# Patient Record
Sex: Male | Born: 1935 | Race: White | Hispanic: No | Smoking: Former smoker
Health system: Southern US, Community
[De-identification: ages and names within clinical notes are randomized; demographics above are authoritative.]

## PROBLEM LIST (undated history)

## (undated) DIAGNOSIS — D509 Iron deficiency anemia, unspecified: Secondary | ICD-10-CM

## (undated) DIAGNOSIS — G309 Alzheimer's disease, unspecified: Secondary | ICD-10-CM

## (undated) DIAGNOSIS — F0280 Dementia in other diseases classified elsewhere without behavioral disturbance: Secondary | ICD-10-CM

## (undated) DIAGNOSIS — I10 Essential (primary) hypertension: Secondary | ICD-10-CM

## (undated) DIAGNOSIS — E119 Type 2 diabetes mellitus without complications: Secondary | ICD-10-CM

## (undated) DIAGNOSIS — N182 Chronic kidney disease, stage 2 (mild): Secondary | ICD-10-CM

## (undated) DIAGNOSIS — F319 Bipolar disorder, unspecified: Secondary | ICD-10-CM

## (undated) DIAGNOSIS — F0390 Unspecified dementia without behavioral disturbance: Secondary | ICD-10-CM

## (undated) DIAGNOSIS — K922 Gastrointestinal hemorrhage, unspecified: Secondary | ICD-10-CM

## (undated) DIAGNOSIS — C4491 Basal cell carcinoma of skin, unspecified: Secondary | ICD-10-CM

## (undated) DIAGNOSIS — C4442 Squamous cell carcinoma of skin of scalp and neck: Secondary | ICD-10-CM

## (undated) DIAGNOSIS — I639 Cerebral infarction, unspecified: Secondary | ICD-10-CM

## (undated) DIAGNOSIS — L89159 Pressure ulcer of sacral region, unspecified stage: Secondary | ICD-10-CM

## (undated) DIAGNOSIS — F0150 Vascular dementia without behavioral disturbance: Secondary | ICD-10-CM

## (undated) HISTORY — DX: Iron deficiency anemia, unspecified: D50.9

## (undated) HISTORY — DX: Dementia in other diseases classified elsewhere without behavioral disturbance: F02.80

## (undated) HISTORY — DX: Vascular dementia without behavioral disturbance: F01.50

## (undated) HISTORY — DX: Gastrointestinal hemorrhage, unspecified: K92.2

## (undated) HISTORY — DX: Alzheimer's disease, unspecified: G30.9

## (undated) HISTORY — DX: Pressure ulcer of sacral region, unspecified stage: L89.159

## (undated) HISTORY — DX: Chronic kidney disease, stage 2 (mild): N18.2

## (undated) HISTORY — DX: Squamous cell carcinoma of skin of scalp and neck: C44.42

## (undated) HISTORY — DX: Basal cell carcinoma of skin, unspecified: C44.91

---

## 2009-08-28 DIAGNOSIS — I639 Cerebral infarction, unspecified: Secondary | ICD-10-CM

## 2009-08-28 HISTORY — DX: Cerebral infarction, unspecified: I63.9

## 2012-07-09 DIAGNOSIS — N182 Chronic kidney disease, stage 2 (mild): Secondary | ICD-10-CM | POA: Insufficient documentation

## 2012-07-09 HISTORY — DX: Chronic kidney disease, stage 2 (mild): N18.2

## 2012-11-30 ENCOUNTER — Emergency Department (HOSPITAL_COMMUNITY): Payer: Medicare Other

## 2012-11-30 ENCOUNTER — Inpatient Hospital Stay (HOSPITAL_COMMUNITY)
Admission: EM | Admit: 2012-11-30 | Discharge: 2012-12-03 | DRG: 470 | Disposition: A | Discharge status: Skilled Nursing Facility | Payer: Medicare Other | Attending: Internal Medicine | Admitting: Internal Medicine

## 2012-11-30 ENCOUNTER — Encounter (HOSPITAL_COMMUNITY): Payer: Self-pay | Admitting: Emergency Medicine

## 2012-11-30 DIAGNOSIS — I10 Essential (primary) hypertension: Secondary | ICD-10-CM | POA: Diagnosis present

## 2012-11-30 DIAGNOSIS — C4491 Basal cell carcinoma of skin, unspecified: Secondary | ICD-10-CM

## 2012-11-30 DIAGNOSIS — N289 Disorder of kidney and ureter, unspecified: Secondary | ICD-10-CM

## 2012-11-30 DIAGNOSIS — S72009A Fracture of unspecified part of neck of unspecified femur, initial encounter for closed fracture: Secondary | ICD-10-CM

## 2012-11-30 DIAGNOSIS — N183 Chronic kidney disease, stage 3 unspecified: Secondary | ICD-10-CM | POA: Diagnosis present

## 2012-11-30 DIAGNOSIS — W010XXA Fall on same level from slipping, tripping and stumbling without subsequent striking against object, initial encounter: Secondary | ICD-10-CM

## 2012-11-30 DIAGNOSIS — F0390 Unspecified dementia without behavioral disturbance: Secondary | ICD-10-CM | POA: Diagnosis present

## 2012-11-30 DIAGNOSIS — F0150 Vascular dementia without behavioral disturbance: Secondary | ICD-10-CM | POA: Diagnosis present

## 2012-11-30 DIAGNOSIS — S72002A Fracture of unspecified part of neck of left femur, initial encounter for closed fracture: Secondary | ICD-10-CM

## 2012-11-30 DIAGNOSIS — Y9210 Unspecified residential institution as the place of occurrence of the external cause: Secondary | ICD-10-CM | POA: Diagnosis present

## 2012-11-30 DIAGNOSIS — R29898 Other symptoms and signs involving the musculoskeletal system: Secondary | ICD-10-CM | POA: Diagnosis present

## 2012-11-30 DIAGNOSIS — E119 Type 2 diabetes mellitus without complications: Secondary | ICD-10-CM | POA: Diagnosis present

## 2012-11-30 DIAGNOSIS — L89899 Pressure ulcer of other site, unspecified stage: Secondary | ICD-10-CM

## 2012-11-30 DIAGNOSIS — E1122 Type 2 diabetes mellitus with diabetic chronic kidney disease: Secondary | ICD-10-CM | POA: Diagnosis present

## 2012-11-30 DIAGNOSIS — I69998 Other sequelae following unspecified cerebrovascular disease: Secondary | ICD-10-CM

## 2012-11-30 DIAGNOSIS — F0280 Dementia in other diseases classified elsewhere without behavioral disturbance: Secondary | ICD-10-CM | POA: Diagnosis present

## 2012-11-30 DIAGNOSIS — N400 Enlarged prostate without lower urinary tract symptoms: Secondary | ICD-10-CM | POA: Diagnosis present

## 2012-11-30 DIAGNOSIS — F319 Bipolar disorder, unspecified: Secondary | ICD-10-CM

## 2012-11-30 DIAGNOSIS — I69959 Hemiplegia and hemiparesis following unspecified cerebrovascular disease affecting unspecified side: Secondary | ICD-10-CM

## 2012-11-30 HISTORY — DX: Type 2 diabetes mellitus without complications: E11.9

## 2012-11-30 HISTORY — DX: Bipolar disorder, unspecified: F31.9

## 2012-11-30 HISTORY — DX: Essential (primary) hypertension: I10

## 2012-11-30 HISTORY — DX: Unspecified dementia, unspecified severity, without behavioral disturbance, psychotic disturbance, mood disturbance, and anxiety: F03.90

## 2012-11-30 HISTORY — DX: Cerebral infarction, unspecified: I63.9

## 2012-11-30 LAB — CBC WITH DIFFERENTIAL/PLATELET
Basophils Absolute: 0 10*3/uL (ref 0.0–0.1)
Basophils Relative: 0 % (ref 0–1)
Eosinophils Absolute: 0.1 10*3/uL (ref 0.0–0.7)
Eosinophils Relative: 2 % (ref 0–5)
HCT: 36.1 % — ABNORMAL LOW (ref 39.0–52.0)
Hemoglobin: 12.3 g/dL — ABNORMAL LOW (ref 13.0–17.0)
MCH: 31.3 pg (ref 26.0–34.0)
MCHC: 34.1 g/dL (ref 30.0–36.0)
Monocytes Absolute: 1 10*3/uL (ref 0.1–1.0)
Monocytes Relative: 12 % (ref 3–12)
RDW: 12.9 % (ref 11.5–15.5)

## 2012-11-30 LAB — COMPREHENSIVE METABOLIC PANEL
AST: 23 U/L (ref 0–37)
Albumin: 3.7 g/dL (ref 3.5–5.2)
BUN: 18 mg/dL (ref 6–23)
Calcium: 9.5 mg/dL (ref 8.4–10.5)
Creatinine, Ser: 1.26 mg/dL (ref 0.50–1.35)
Total Bilirubin: 0.7 mg/dL (ref 0.3–1.2)
Total Protein: 6.4 g/dL (ref 6.0–8.3)

## 2012-11-30 MED ORDER — SODIUM CHLORIDE 0.9 % IV SOLN: INTRAVENOUS | Status: DC

## 2012-11-30 MED ORDER — ALUM & MAG HYDROXIDE-SIMETH 200-200-20 MG/5ML PO SUSP: 30.0000 mL | Freq: Four times a day (QID) | ORAL | Status: DC | PRN

## 2012-11-30 MED ORDER — ACETAMINOPHEN 650 MG RE SUPP: 650.0000 mg | Freq: Four times a day (QID) | RECTAL | Status: DC | PRN

## 2012-11-30 MED ORDER — ZOLPIDEM TARTRATE 5 MG PO TABS: 5.0000 mg | ORAL_TABLET | Freq: Every evening | ORAL | Status: DC | PRN

## 2012-11-30 MED ORDER — ACETAMINOPHEN 325 MG PO TABS: 650.0000 mg | ORAL_TABLET | Freq: Four times a day (QID) | ORAL | Status: DC | PRN

## 2012-11-30 MED ORDER — SODIUM CHLORIDE 0.9 % IV SOLN
INTRAVENOUS | Status: DC
  Administered 2012-11-30: 22:00:00 via INTRAVENOUS

## 2012-11-30 MED ORDER — SODIUM CHLORIDE 0.9 % IV SOLN
INTRAVENOUS | Status: DC
  Administered 2012-11-30: via INTRAVENOUS

## 2012-11-30 MED ORDER — MORPHINE SULFATE 4 MG/ML IJ SOLN
4.0000 mg | Freq: Once | INTRAMUSCULAR | Status: AC
  Administered 2012-11-30: 4 mg via INTRAVENOUS
  Filled 2012-11-30: qty 1

## 2012-11-30 MED ORDER — OXYCODONE HCL 5 MG PO TABS
5.0000 mg | ORAL_TABLET | ORAL | Status: DC | PRN
  Administered 2012-11-30 – 2012-12-02 (×6): 5 mg via ORAL
  Filled 2012-11-30 (×6): qty 1

## 2012-11-30 MED ORDER — HYDROMORPHONE HCL PF 1 MG/ML IJ SOLN
0.5000 mg | INTRAMUSCULAR | Status: DC | PRN
  Administered 2012-12-01 – 2012-12-02 (×4): 1 mg via INTRAVENOUS
  Filled 2012-11-30 (×4): qty 1

## 2012-11-30 MED ORDER — ONDANSETRON HCL 4 MG/2ML IJ SOLN: 4.0000 mg | Freq: Four times a day (QID) | INTRAMUSCULAR | Status: DC | PRN

## 2012-11-30 MED ORDER — ONDANSETRON HCL 4 MG PO TABS: 4.0000 mg | ORAL_TABLET | Freq: Four times a day (QID) | ORAL | Status: DC | PRN

## 2012-11-30 MED ORDER — ONDANSETRON HCL 4 MG/2ML IJ SOLN
4.0000 mg | Freq: Once | INTRAMUSCULAR | Status: AC
  Administered 2012-11-30: 4 mg via INTRAVENOUS
  Filled 2012-11-30: qty 2

## 2012-11-30 NOTE — Consult Note (Signed)
Reason for Consult:left hip fracture Referring Physician: EDP  HPI: George Kemp is an 79 y.o. male resident of [facility] with PMH CVA with residual L hemiparesis, limited household ambulator fell today with c/o left hip pain and inability to bearweight.  Past Medical History  Diagnosis Date  . Stroke 08/2009    Left side with residual deficit  . Hypertension   . Bipolar 1 disorder   . Dementia     History reviewed. No pertinent past surgical history.  History reviewed. No pertinent family history.  Social History:  reports that he has never smoked. He does not have any smokeless tobacco history on file. He reports that he does not drink alcohol or use illicit drugs.  Allergies: No Known Allergies  Medications: none listed. No anticoagulatnts by report  No results found for this or any previous visit (from the past 48 hour(s)).  Dg Pelvis 1-2 Views  11/30/2012   *RADIOLOGY REPORT*  Clinical Data: Left hip pain.  PELVIS - 1-2 VIEW  Comparison: None.  Findings: Subcapital fracture of the left hip varus angulation of the fracture fragments.  No dislocation.  Degenerative changes in both hip.  The pelvis appears intact.  The pelvic rim, SI joints, and symphysis are not displaced.  No focal bone lesion appreciated. Vascular calcifications.  IMPRESSION: Subcapital fracture of the proximal left femur with varus angulation of the fracture fragments.  No displaced pelvic fractures identified.   Original Report Authenticated By: Burman Nieves, M.D.   Dg Femur Left  11/30/2012   *RADIOLOGY REPORT*  Clinical Data: Fall  LEFT FEMUR - 2 VIEW  Comparison: None  Findings: Osseous demineralization. Displaced subcapital fracture left femur. Left hip joint space narrowing. No additional fracture, dislocation or bone destruction. Scattered atherosclerotic calcifications. Visualized portion of the left pelvis intact.  IMPRESSION: Osseous demineralization. Displaced subcapital fracture left femur.   Original  Report Authenticated By: Ulyses Southward, M.D.     Vitals Temp:  [97.8 F (36.6 C)] 97.8 F (36.6 C) (07/04 2140) Pulse Rate:  [95] 95 (07/04 2140) Resp:  [21] 21 (07/04 2140) BP: (134)/(79) 134/79 mmHg (07/04 2140) SpO2:  [99 %] 99 % (07/04 2140) There is no height or weight on file to calculate BMI.  Physical Exam:  [place]/AT. Thin WM, cooperative. Neck non tender. UE's no gross bone or joint instability.Diffusely tender about left hip. grossley n/v intact distally BLE.     Assessment/Plan: Impression: displaced left femoral neck fracture Treatment: I have discussed with patient and family ( including daughter POA) regarding treatment options and risks vs benefits there of. They understand and accept and agree with plan for left hip hemiarthroplasty. OR tentatively scheduled for Saturday morning.  Teonia Yager M 11/30/2012, 11:34 PM

## 2012-11-30 NOTE — ED Provider Notes (Signed)
   History    CSN: [id number] Arrival date & time 11/30/12  2130  First MD Initiated Contact with Patient 11/30/12 2141     Chief Complaint  Patient presents with  . Fall   (Consider location/radiation/quality/duration/timing/severity/associated sxs/prior Treatment) Patient is a 79 y.o. male presenting with fall. The history is provided by the patient.  Fall   patient here after having an unwitnessed fall at the nursing home. No reported loss of consciousness. Complains of severe pain to his left hip and he is unable to ambulate. Denies any neck or head pain. Denies any chest or abdominal pain. Patient has had a stroke in the past and does have some residual left-sided weakness. EMS was called and patient transported here. Patient received fentanyl 50 mcg prior to arrival No past medical history on file. No past surgical history on file. No family history on file. History  Substance Use Topics  . Smoking status: Not on file  . Smokeless tobacco: Not on file  . Alcohol Use: Not on file    Review of Systems  All other systems reviewed and are negative.    Allergies  Review of patient's allergies indicates not on file.  Home Medications  No current outpatient prescriptions on file. BP 134/79  Pulse 95  Temp(Src) 97.8 F (36.6 C) (Oral)  Resp 21  SpO2 99% Physical Exam  Nursing note and vitals reviewed. Constitutional: He is oriented to person, place, and time. He appears well-developed and well-nourished.  Non-toxic appearance. No distress.  HENT:  Head: Normocephalic and atraumatic.  Eyes: Conjunctivae, EOM and lids are normal. Pupils are equal, round, and reactive to light.  Neck: Normal range of motion. Neck supple. No tracheal deviation present. No mass present.  Cardiovascular: Normal rate, regular rhythm and normal heart sounds.  Exam reveals no gallop.   No murmur heard. Pulmonary/Chest: Effort normal and breath sounds normal. No stridor. No respiratory distress.  He has no decreased breath sounds. He has no wheezes. He has no rhonchi. He has no rales.  Abdominal: Soft. Normal appearance and bowel sounds are normal. He exhibits no distension. There is no tenderness. There is no rebound and no CVA tenderness.  Musculoskeletal: Normal range of motion. He exhibits no edema and no tenderness.  Left leg shortened and rotated. Patient's neurovascular intact.  Neurological: He is alert and oriented to person, place, and time. He has normal strength. No cranial nerve deficit or sensory deficit. GCS eye subscore is 4. GCS verbal subscore is 5. GCS motor subscore is 6.  Skin: Skin is warm and dry. No abrasion and no rash noted.  Psychiatric: He has a normal mood and affect. His speech is normal and behavior is normal.    ED Course  Procedures (including critical care time) Labs Reviewed - No data to display No results found. No diagnosis found.  MDM  Spoke with Dr. supple and he will come see the patient and discussed with the family about operative repair for hip fracture. He request that the hospitalist admit the patient  Patient given morphine for pain.  Toy Baker, MD 11/30/12 [id number]

## 2012-11-30 NOTE — ED Notes (Signed)
BIB [facility]. From [facility] (called out for fall). Staff had found patient on floor of bathroom c/o Left leg pain (1730). EMS arrived, found in bed. Left leg externally rotated and shortened. Visible muscle spasms. Appropriate pedal pulse and sensation intact. (Hx of Left side stroke with residual deficit). Fentanyl given (2102). 20g R AC

## 2012-12-01 ENCOUNTER — Other Ambulatory Visit: Payer: Self-pay

## 2012-12-01 ENCOUNTER — Inpatient Hospital Stay: Admit: 2012-12-01 | Payer: Self-pay | Admitting: Specialist

## 2012-12-01 ENCOUNTER — Encounter (HOSPITAL_COMMUNITY): Payer: Self-pay | Admitting: Anesthesiology

## 2012-12-01 ENCOUNTER — Inpatient Hospital Stay (HOSPITAL_COMMUNITY): Payer: Medicare Other

## 2012-12-01 ENCOUNTER — Encounter (HOSPITAL_COMMUNITY)
Admission: EM | Disposition: A | Discharge status: Skilled Nursing Facility | Payer: Self-pay | Source: Home / Self Care | Attending: Internal Medicine

## 2012-12-01 ENCOUNTER — Encounter (HOSPITAL_COMMUNITY): Payer: Self-pay | Admitting: Internal Medicine

## 2012-12-01 ENCOUNTER — Inpatient Hospital Stay (HOSPITAL_COMMUNITY): Payer: Medicare Other | Admitting: Anesthesiology

## 2012-12-01 DIAGNOSIS — S72002A Fracture of unspecified part of neck of left femur, initial encounter for closed fracture: Secondary | ICD-10-CM

## 2012-12-01 DIAGNOSIS — W010XXA Fall on same level from slipping, tripping and stumbling without subsequent striking against object, initial encounter: Secondary | ICD-10-CM

## 2012-12-01 DIAGNOSIS — I10 Essential (primary) hypertension: Secondary | ICD-10-CM | POA: Diagnosis present

## 2012-12-01 DIAGNOSIS — Z794 Long term (current) use of insulin: Secondary | ICD-10-CM | POA: Diagnosis present

## 2012-12-01 DIAGNOSIS — F0150 Vascular dementia without behavioral disturbance: Secondary | ICD-10-CM

## 2012-12-01 HISTORY — PX: HIP ARTHROPLASTY: SHX981

## 2012-12-01 HISTORY — DX: Vascular dementia, unspecified severity, without behavioral disturbance, psychotic disturbance, mood disturbance, and anxiety: F01.50

## 2012-12-01 LAB — GLUCOSE, CAPILLARY
Glucose-Capillary: 159 mg/dL — ABNORMAL HIGH (ref 70–99)
Glucose-Capillary: 154 mg/dL — ABNORMAL HIGH (ref 70–99)

## 2012-12-01 LAB — URINALYSIS, ROUTINE W REFLEX MICROSCOPIC
Glucose, UA: NEGATIVE mg/dL
Leukocytes, UA: NEGATIVE
Protein, ur: NEGATIVE mg/dL
Specific Gravity, Urine: 1.005 (ref 1.005–1.030)

## 2012-12-01 LAB — CBC
HCT: 35.1 % — ABNORMAL LOW (ref 39.0–52.0)
Hemoglobin: 11.8 g/dL — ABNORMAL LOW (ref 13.0–17.0)
MCH: 31.6 pg (ref 26.0–34.0)
MCHC: 33.6 g/dL (ref 30.0–36.0)
MCV: 93.9 fL (ref 78.0–100.0)

## 2012-12-01 LAB — BASIC METABOLIC PANEL
BUN: 16 mg/dL (ref 6–23)
CO2: 26 mEq/L (ref 19–32)
Chloride: 108 mEq/L (ref 96–112)
Creatinine, Ser: 1.17 mg/dL (ref 0.50–1.35)
GFR calc Af Amer: 65 mL/min — ABNORMAL LOW (ref >90)
Glucose, Bld: 166 mg/dL — ABNORMAL HIGH (ref 70–99)
Potassium: 4.1 mEq/L (ref 3.5–5.1)

## 2012-12-01 LAB — ABO/RH: ABO/RH(D): B POS

## 2012-12-01 LAB — MRSA PCR SCREENING: MRSA by PCR: NEGATIVE

## 2012-12-01 SURGERY — HEMIARTHROPLASTY, HIP, DIRECT ANTERIOR APPROACH, FOR FRACTURE
Anesthesia: General | Laterality: Left

## 2012-12-01 SURGERY — HEMIARTHROPLASTY, HIP, DIRECT ANTERIOR APPROACH, FOR FRACTURE
Anesthesia: General | Site: Hip | Laterality: Left

## 2012-12-01 MED ORDER — CEFAZOLIN SODIUM-DEXTROSE 2-3 GM-% IV SOLR
2.0000 g | Freq: Three times a day (TID) | INTRAVENOUS | Status: AC
  Administered 2012-12-01 (×2): 2 g via INTRAVENOUS
  Filled 2012-12-01 (×2): qty 50

## 2012-12-01 MED ORDER — METOPROLOL TARTRATE 1 MG/ML IV SOLN
1.0000 mg | INTRAVENOUS | Status: DC | PRN
Start: 2012-12-01 — End: 2012-12-01
  Administered 2012-12-01 (×5): 1 mg via INTRAVENOUS

## 2012-12-01 MED ORDER — METOPROLOL TARTRATE 25 MG PO TABS
25.0000 mg | ORAL_TABLET | Freq: Two times a day (BID) | ORAL | Status: DC
  Administered 2012-12-02 – 2012-12-03 (×2): 25 mg via ORAL
  Filled 2012-12-01 (×7): qty 1

## 2012-12-01 MED ORDER — QUETIAPINE FUMARATE 25 MG PO TABS
25.0000 mg | ORAL_TABLET | Freq: Every day | ORAL | Status: DC
  Administered 2012-12-02: 25 mg via ORAL
  Filled 2012-12-01 (×3): qty 1

## 2012-12-01 MED ORDER — FENTANYL CITRATE 0.05 MG/ML IJ SOLN: 25.0000 ug | INTRAMUSCULAR | Status: DC | PRN

## 2012-12-01 MED ORDER — ASPIRIN 81 MG PO TBEC: 81.0000 mg | DELAYED_RELEASE_TABLET | Freq: Every day | ORAL | Status: DC

## 2012-12-01 MED ORDER — HYDROMORPHONE HCL PF 2 MG/ML IJ SOLN
INTRAMUSCULAR | Status: AC
  Administered 2012-12-01: 1 mg
  Filled 2012-12-01: qty 1

## 2012-12-01 MED ORDER — MAGNESIUM CITRATE PO SOLN: 1.0000 | Freq: Once | ORAL | Status: AC | PRN

## 2012-12-01 MED ORDER — HYDROCODONE-ACETAMINOPHEN 5-325 MG PO TABS: 1.0000 | ORAL_TABLET | Freq: Four times a day (QID) | ORAL | Status: DC | PRN

## 2012-12-01 MED ORDER — NEOSTIGMINE METHYLSULFATE 1 MG/ML IJ SOLN
INTRAMUSCULAR | Status: DC | PRN
  Administered 2012-12-01: 5 mg via INTRAVENOUS

## 2012-12-01 MED ORDER — PHENYLEPHRINE HCL 10 MG/ML IJ SOLN
INTRAMUSCULAR | Status: DC | PRN
  Administered 2012-12-01: 80 ug via INTRAVENOUS

## 2012-12-01 MED ORDER — FENTANYL CITRATE 0.05 MG/ML IJ SOLN
INTRAMUSCULAR | Status: DC | PRN
  Administered 2012-12-01 (×4): 50 ug via INTRAVENOUS

## 2012-12-01 MED ORDER — GLYCOPYRROLATE 0.2 MG/ML IJ SOLN
INTRAMUSCULAR | Status: DC | PRN
  Administered 2012-12-01: 0.6 mg via INTRAVENOUS

## 2012-12-01 MED ORDER — METOCLOPRAMIDE HCL 5 MG/ML IJ SOLN: 5.0000 mg | Freq: Three times a day (TID) | INTRAMUSCULAR | Status: DC | PRN

## 2012-12-01 MED ORDER — CEFAZOLIN SODIUM-DEXTROSE 2-3 GM-% IV SOLR: 2.0000 g | INTRAVENOUS | Status: DC

## 2012-12-01 MED ORDER — CHLORHEXIDINE GLUCONATE 4 % EX LIQD
60.0000 mL | Freq: Once | CUTANEOUS | Status: DC
  Filled 2012-12-01: qty 60

## 2012-12-01 MED ORDER — SUCCINYLCHOLINE CHLORIDE 20 MG/ML IJ SOLN
INTRAMUSCULAR | Status: DC | PRN
  Administered 2012-12-01: 100 mg via INTRAVENOUS

## 2012-12-01 MED ORDER — SENNOSIDES-DOCUSATE SODIUM 8.6-50 MG PO TABS
1.0000 | ORAL_TABLET | Freq: Every evening | ORAL | Status: DC | PRN
  Filled 2012-12-01: qty 1

## 2012-12-01 MED ORDER — DONEPEZIL HCL 10 MG PO TABS
10.0000 mg | ORAL_TABLET | Freq: Every day | ORAL | Status: DC
  Administered 2012-12-01 – 2012-12-03 (×3): 10 mg via ORAL
  Filled 2012-12-01 (×4): qty 1

## 2012-12-01 MED ORDER — SODIUM CHLORIDE 0.9 % IV SOLN
INTRAVENOUS | Status: AC
  Administered 2012-12-01 – 2012-12-02 (×2): via INTRAVENOUS

## 2012-12-01 MED ORDER — CLINDAMYCIN PHOSPHATE 600 MG/50ML IV SOLN
600.0000 mg | Freq: Once | INTRAVENOUS | Status: DC
  Filled 2012-12-01: qty 50

## 2012-12-01 MED ORDER — ASPIRIN EC 81 MG PO TBEC
81.0000 mg | DELAYED_RELEASE_TABLET | Freq: Every day | ORAL | Status: DC
  Administered 2012-12-02 – 2012-12-03 (×2): 81 mg via ORAL
  Filled 2012-12-01 (×3): qty 1

## 2012-12-01 MED ORDER — DOCUSATE SODIUM 100 MG PO CAPS
100.0000 mg | ORAL_CAPSULE | Freq: Two times a day (BID) | ORAL | Status: DC
  Administered 2012-12-02 – 2012-12-03 (×3): 100 mg via ORAL
  Filled 2012-12-01 (×5): qty 1

## 2012-12-01 MED ORDER — PROPOFOL 10 MG/ML IV BOLUS
INTRAVENOUS | Status: DC | PRN
  Administered 2012-12-01: 80 mg via INTRAVENOUS

## 2012-12-01 MED ORDER — SODIUM CHLORIDE 0.9 % IV SOLN: INTRAVENOUS | Status: DC

## 2012-12-01 MED ORDER — LATANOPROST 0.005 % OP SOLN
1.0000 [drp] | Freq: Every day | OPHTHALMIC | Status: DC
  Administered 2012-12-02: 1 [drp] via OPHTHALMIC
  Filled 2012-12-01 (×2): qty 2.5

## 2012-12-01 MED ORDER — MEPERIDINE HCL 25 MG/ML IJ SOLN: 6.2500 mg | INTRAMUSCULAR | Status: DC | PRN

## 2012-12-01 MED ORDER — CLOMIPRAMINE HCL 25 MG PO CAPS
25.0000 mg | ORAL_CAPSULE | Freq: Every morning | ORAL | Status: DC
  Administered 2012-12-01 – 2012-12-03 (×3): 25 mg via ORAL
  Filled 2012-12-01 (×4): qty 1

## 2012-12-01 MED ORDER — ADULT MULTIVITAMIN W/MINERALS CH
1.0000 | ORAL_TABLET | Freq: Every day | ORAL | Status: DC
  Administered 2012-12-01 – 2012-12-03 (×3): 1 via ORAL
  Filled 2012-12-01 (×4): qty 1

## 2012-12-01 MED ORDER — VITAMIN D3 25 MCG (1000 UNIT) PO TABS
1000.0000 [IU] | ORAL_TABLET | Freq: Every morning | ORAL | Status: DC
  Administered 2012-12-01 – 2012-12-03 (×3): 1000 [IU] via ORAL
  Filled 2012-12-01 (×4): qty 1

## 2012-12-01 MED ORDER — TAMSULOSIN HCL 0.4 MG PO CAPS
0.4000 mg | ORAL_CAPSULE | Freq: Every day | ORAL | Status: DC
  Administered 2012-12-01 – 2012-12-03 (×3): 0.4 mg via ORAL
  Filled 2012-12-01 (×5): qty 1

## 2012-12-01 MED ORDER — BISACODYL 10 MG RE SUPP: 10.0000 mg | Freq: Every day | RECTAL | Status: DC | PRN

## 2012-12-01 MED ORDER — LIDOCAINE HCL (CARDIAC) 20 MG/ML IV SOLN
INTRAVENOUS | Status: DC | PRN
  Administered 2012-12-01: 50 mg via INTRAVENOUS

## 2012-12-01 MED ORDER — MENTHOL 3 MG MT LOZG
1.0000 | LOZENGE | OROMUCOSAL | Status: DC | PRN
  Filled 2012-12-01: qty 9

## 2012-12-01 MED ORDER — ONDANSETRON HCL 4 MG/2ML IJ SOLN
INTRAMUSCULAR | Status: DC | PRN
  Administered 2012-12-01: 4 mg via INTRAVENOUS

## 2012-12-01 MED ORDER — METOCLOPRAMIDE HCL 10 MG PO TABS: 5.0000 mg | ORAL_TABLET | Freq: Three times a day (TID) | ORAL | Status: DC | PRN

## 2012-12-01 MED ORDER — LACTATED RINGERS IV SOLN
INTRAVENOUS | Status: DC | PRN
  Administered 2012-12-01: 1000 mL
  Administered 2012-12-01 (×2): via INTRAVENOUS

## 2012-12-01 MED ORDER — PROMETHAZINE HCL 25 MG/ML IJ SOLN: 6.2500 mg | INTRAMUSCULAR | Status: DC | PRN

## 2012-12-01 MED ORDER — PHENOL 1.4 % MT LIQD
1.0000 | OROMUCOSAL | Status: DC | PRN
  Filled 2012-12-01: qty 177

## 2012-12-01 MED ORDER — FENTANYL CITRATE 0.05 MG/ML IJ SOLN
25.0000 ug | INTRAMUSCULAR | Status: DC | PRN
  Administered 2012-12-01 (×4): 25 ug via INTRAVENOUS

## 2012-12-01 MED ORDER — POLYETHYLENE GLYCOL 3350 17 G PO PACK
17.0000 g | PACK | ORAL | Status: DC
  Administered 2012-12-03: 17 g via ORAL
  Filled 2012-12-01 (×2): qty 1

## 2012-12-01 MED ORDER — CEFAZOLIN SODIUM-DEXTROSE 2-3 GM-% IV SOLR
INTRAVENOUS | Status: DC | PRN
  Administered 2012-12-01: 2 g via INTRAVENOUS

## 2012-12-01 MED ORDER — INSULIN ASPART 100 UNIT/ML ~~LOC~~ SOLN
0.0000 [IU] | Freq: Four times a day (QID) | SUBCUTANEOUS | Status: DC
  Administered 2012-12-01 – 2012-12-02 (×2): 2 [IU] via SUBCUTANEOUS

## 2012-12-01 MED ORDER — METOPROLOL TARTRATE 1 MG/ML IV SOLN
5.0000 mg | INTRAVENOUS | Status: DC | PRN
  Administered 2012-12-02 (×2): 2.5 mg via INTRAVENOUS
  Filled 2012-12-01 (×2): qty 5

## 2012-12-01 MED ORDER — CLOPIDOGREL BISULFATE 75 MG PO TABS
75.0000 mg | ORAL_TABLET | Freq: Every day | ORAL | Status: DC
  Administered 2012-12-02 – 2012-12-03 (×2): 75 mg via ORAL
  Filled 2012-12-01 (×3): qty 1

## 2012-12-01 MED ORDER — ATORVASTATIN CALCIUM 20 MG PO TABS
20.0000 mg | ORAL_TABLET | Freq: Every morning | ORAL | Status: DC
  Administered 2012-12-01 – 2012-12-03 (×3): 20 mg via ORAL
  Filled 2012-12-01 (×4): qty 1

## 2012-12-01 MED ORDER — ROCURONIUM BROMIDE 100 MG/10ML IV SOLN
INTRAVENOUS | Status: DC | PRN
  Administered 2012-12-01: 30 mg via INTRAVENOUS
  Administered 2012-12-01: 20 mg via INTRAVENOUS

## 2012-12-01 MED ORDER — ESMOLOL HCL 10 MG/ML IV SOLN
INTRAVENOUS | Status: DC | PRN
  Administered 2012-12-01: 30 mg via INTRAVENOUS

## 2012-12-01 SURGICAL SUPPLY — 54 items
BAG ZIPLOCK 12X15 (MISCELLANEOUS) ×4 IMPLANT
BLADE SAG 18X100X1.27 (BLADE) ×2 IMPLANT
BLADE SAW SGTL 18X1.27X75 (BLADE) ×2 IMPLANT
BRUSH FEMORAL CANAL (MISCELLANEOUS) IMPLANT
CAPT HIP HD POR BIPOL/UNIPOL ×2 IMPLANT
CHLORAPREP W/TINT 26ML (MISCELLANEOUS) ×2 IMPLANT
CLOTH 2% CHLOROHEXIDINE 3PK (PERSONAL CARE ITEMS) IMPLANT
CLOTH BEACON ORANGE TIMEOUT ST (SAFETY) ×2 IMPLANT
DRAPE INCISE IOBAN 66X45 STRL (DRAPES) ×2 IMPLANT
DRAPE ORTHO SPLIT 77X108 STRL (DRAPES) ×1
DRAPE POUCH INSTRU U-SHP 10X18 (DRAPES) ×2 IMPLANT
DRAPE SURG ORHT 6 SPLT 77X108 (DRAPES) ×1 IMPLANT
DRAPE U-SHAPE 47X51 STRL (DRAPES) ×2 IMPLANT
DRSG AQUACEL AG ADV 3.5X 6 (GAUZE/BANDAGES/DRESSINGS) ×2 IMPLANT
DRSG AQUACEL AG ADV 3.5X10 (GAUZE/BANDAGES/DRESSINGS) ×4 IMPLANT
DRSG EMULSION OIL 3X16 NADH (GAUZE/BANDAGES/DRESSINGS) IMPLANT
DRSG PAD ABDOMINAL 8X10 ST (GAUZE/BANDAGES/DRESSINGS) IMPLANT
DURAPREP 26ML APPLICATOR (WOUND CARE) ×2 IMPLANT
ELECT BLADE TIP CTD 4 INCH (ELECTRODE) ×2 IMPLANT
ELECT REM PT RETURN 9FT ADLT (ELECTROSURGICAL) ×2
ELECTRODE REM PT RTRN 9FT ADLT (ELECTROSURGICAL) ×1 IMPLANT
EVACUATOR 1/8 PVC DRAIN (DRAIN) ×2 IMPLANT
FACESHIELD LNG OPTICON STERILE (SAFETY) ×4 IMPLANT
FLOSEAL 10ML (HEMOSTASIS) ×2 IMPLANT
GLOVE BIOGEL PI IND STRL 7.5 (GLOVE) ×1 IMPLANT
GLOVE BIOGEL PI IND STRL 8 (GLOVE) ×1 IMPLANT
GLOVE BIOGEL PI INDICATOR 7.5 (GLOVE) ×1
GLOVE BIOGEL PI INDICATOR 8 (GLOVE) ×1
GLOVE SURG SS PI 7.5 STRL IVOR (GLOVE) ×2 IMPLANT
GLOVE SURG SS PI 8.0 STRL IVOR (GLOVE) ×4 IMPLANT
GOWN PREVENTION PLUS LG XLONG (DISPOSABLE) ×2 IMPLANT
GOWN PREVENTION PLUS XLARGE (GOWN DISPOSABLE) ×4 IMPLANT
GOWN STRL REIN XL XLG (GOWN DISPOSABLE) ×4 IMPLANT
HANDPIECE INTERPULSE COAX TIP (DISPOSABLE)
IMMOBILIZER KNEE 20 (SOFTGOODS) ×2
IMMOBILIZER KNEE 20 THIGH 36 (SOFTGOODS) ×1 IMPLANT
KIT BASIN OR (CUSTOM PROCEDURE TRAY) ×2 IMPLANT
MANIFOLD NEPTUNE II (INSTRUMENTS) IMPLANT
NS IRRIG 1000ML POUR BTL (IV SOLUTION) ×2 IMPLANT
PACK TOTAL JOINT (CUSTOM PROCEDURE TRAY) ×2 IMPLANT
POSITIONER SURGICAL ARM (MISCELLANEOUS) ×2 IMPLANT
SET HNDPC FAN SPRY TIP SCT (DISPOSABLE) IMPLANT
SPONGE GAUZE 4X4 12PLY (GAUZE/BANDAGES/DRESSINGS) IMPLANT
SPONGE LAP 18X18 X RAY DECT (DISPOSABLE) ×2 IMPLANT
STAPLER VISISTAT (STAPLE) IMPLANT
SUT ETHIBOND NAB CT1 #1 30IN (SUTURE) ×6 IMPLANT
SUT VIC AB 0 CT1 27 (SUTURE) ×2
SUT VIC AB 0 CT1 27XBRD ANTBC (SUTURE) ×2 IMPLANT
SUT VIC AB 1 CT1 27 (SUTURE)
SUT VIC AB 1 CT1 27XBRD ANTBC (SUTURE) IMPLANT
SUT VIC AB 2-0 CT1 27 (SUTURE) ×2
SUT VIC AB 2-0 CT1 TAPERPNT 27 (SUTURE) ×2 IMPLANT
TOWER CARTRIDGE SMART MIX (DISPOSABLE) IMPLANT
WATER STERILE IRR 1500ML POUR (IV SOLUTION) ×2 IMPLANT

## 2012-12-01 NOTE — Progress Notes (Signed)
[facility]                                                                                Patient Demographics  George Kemp, is a 79 y.o. male, DOB - April 06, 1927, WGN:[id number], HQI:[id number]  Admit date - 11/30/2012  Admitting Physician Senaida Lange, MD  Outpatient Primary MD for the patient is No primary provider on file.  LOS - 1   Chief Complaint  Patient presents with  . Fall        Assessment & Plan    1. Trip Fall with Closed Left Hip Fracture- to undergo open reduction and internal fixation at [facility] on 12/01/2012 as requested by orthopedic surgeon Dr. Shelle Iron due to no or openings at [facility], have called and updated patient's daughter about the plan.  Cardio-Pulm Risk stratification for surgery and recommendations to minimize the same:-  A.Cardio-Pulmonary Risk -  this patient is a moderate risk for adverse Cardio-Pulmonary  Outcome  from surgery, the risks and benefits were discussed and acceptable to ration and his daughter  Recommendations for optimizing Cardio-Pulmonary  Risk risk factors  1. Keep SBP<140, HR<85, use Lopressor 5mg  IV q4hrs PRN, or B.Blocker drip PRN. 2. Moniotr I&Os. 3. Minimal sedation and Narcotics. 4. Good pulmunary toilet. 5. PRN Nebs and as needed oxygen to keep Pox>90% 6. Hb>8, transfuse as needed- Lasix 10mg  IV after each unit PRBC Transfused.   B.Bleeding Risk - no previous surgical complications, of note we have held his Plavix.   Lab Results  Component Value Date   PLT 153 12/01/2012                  No results found for this basename: INR, PROTIME      Will request Surgeon to please Order Lovenox/DVT prophylaxis of his/her choice when OK from Surgeon's standpoint post op.      2. Fall due to Stumbling/Slipping- Due to Chronic Left Sided Weakness from his CVA hx. Bedrest and Fall Precautions. PT after surgery to be commenced.    3. HTN- IV hydralazine PRN SBP > 160 and IV Lopressor per parameters  for tachycardia and hypertension, will place on low dose oral beta blocker instead of Norvasc.    4. Diabetes- continue SSI coverage PRN.   No results found for this basename: HGBA1C   CBG (last 3)   Recent Labs  12/01/12 0644  GLUCAP 159*       5. Dementia - No acute changes, stable. Home medications have been resumed with a sip of water.    6. BPH. Continue home dose Flomax.    7. History of CVA. Plavix has been held as patient is due for surgery, continue home dose statin.     Code Status: full  Family Communication: Daughter called and informed over the cell phone  Disposition Plan: [facility], then rehab   Procedures  ORIF L femur planned for 12-01-12   Consults Ortho   DVT Prophylaxis   SCDs    Lab Results  Component Value Date   PLT 153 12/01/2012    Medications  Scheduled Meds: . sodium chloride   Intravenous STAT  . chlorhexidine  60  mL Topical Once   Continuous Infusions: . sodium chloride 75 mL/hr at 11/30/12 2351   PRN Meds:.acetaminophen, acetaminophen, alum & mag hydroxide-simeth, HYDROmorphone (DILAUDID) injection, ondansetron (ZOFRAN) IV, ondansetron, oxyCODONE, zolpidem  Antibiotics     Anti-infectives   None       Time Spent in minutes   35   Denecia Brunette K M.D on 12/01/2012 at 8:27 AM  Between 7am to 7pm - Pager - [phone]  After 7pm go to [email or web address] - password [email or web address]  And look for the night coverage person covering for me after hours  [facility] Office  [phone]    Subjective:   George Kemp today has, No headache, No chest pain, No abdominal pain - No Nausea, No new weakness tingling or numbness, No Cough - SOB. On it left-sided weakness, some left hip pain.  Objective:   Filed Vitals:   11/30/12 2349 12/01/12 0031 12/01/12 0641 12/01/12 0746  BP: 151/77 141/86 121/65   Pulse: 101 103 110   Temp: 97.9 F (36.6 C) 98.2 F (36.8 C) 98.2 F (36.8 C)   TempSrc: Oral Oral    Resp: 18 18  18    Height:    5\' 11"  (1.803 m)  Weight:    77.747 kg (171 lb 6.4 oz)  SpO2: 98% 97% 98%     Wt Readings from Last 3 Encounters:  12/01/12 77.747 kg (171 lb 6.4 oz)  12/01/12 77.747 kg (171 lb 6.4 oz)  12/01/12 77.747 kg (171 lb 6.4 oz)     Intake/Output Summary (Last 24 hours) at 12/01/12 0827 Last data filed at 12/01/12 0643  Gross per 24 hour  Intake      0 ml  Output    450 ml  Net   -450 ml    Exam Awake Alert, Oriented X 3, No new F.N deficits, chronic left-sided hemiparesis, Normal affect [place].AT,PERRAL Supple Neck,No JVD, No cervical lymphadenopathy appriciated.  Symmetrical Chest wall movement, Good air movement bilaterally, CTAB RRR,No Gallops,Rubs or new Murmurs, No Parasternal Heave +ve B.Sounds, Abd Soft, Non tender, No organomegaly appriciated, No rebound - guarding or rigidity. No Cyanosis, Clubbing or edema, No new Rash or bruise     Data Review   Micro Results Recent Results (from the past 240 hour(s))  MRSA PCR SCREENING     Status: None   Collection Time    12/01/12  6:53 AM      Result Value Range Status   MRSA by PCR NEGATIVE  NEGATIVE Final   Comment:            The GeneXpert MRSA Assay (FDA     approved for NASAL specimens     only), is one component of a     comprehensive MRSA colonization     surveillance program. It is not     intended to diagnose MRSA     infection nor to guide or     monitor treatment for     MRSA infections.    Radiology Reports Dg Chest 2 View  12/01/2012   *RADIOLOGY REPORT*  Clinical Data: Pain.  History of stroke and hypertension.  Preop left hip pain.  CHEST - 2 VIEW  Comparison: None.  Findings: Shallow inspiration with linear atelectasis in the lung bases.  No blunting of costophrenic angles.  No pneumothorax. Mediastinal contours appear intact.  Normal heart size and pulmonary vascularity.  Degenerative changes in the spine.  IMPRESSION: Shallow inspiration with atelectasis in the lung bases.   Original Report  Authenticated By: Burman Nieves, M.D.   Dg Pelvis 1-2 Views  11/30/2012   *RADIOLOGY REPORT*  Clinical Data: Left hip pain.  PELVIS - 1-2 VIEW  Comparison: None.  Findings: Subcapital fracture of the left hip varus angulation of the fracture fragments.  No dislocation.  Degenerative changes in both hip.  The pelvis appears intact.  The pelvic rim, SI joints, and symphysis are not displaced.  No focal bone lesion appreciated. Vascular calcifications.  IMPRESSION: Subcapital fracture of the proximal left femur with varus angulation of the fracture fragments.  No displaced pelvic fractures identified.   Original Report Authenticated By: Burman Nieves, M.D.   Dg Femur Left  11/30/2012   *RADIOLOGY REPORT*  Clinical Data: Fall  LEFT FEMUR - 2 VIEW  Comparison: None  Findings: Osseous demineralization. Displaced subcapital fracture left femur. Left hip joint space narrowing. No additional fracture, dislocation or bone destruction. Scattered atherosclerotic calcifications. Visualized portion of the left pelvis intact.  IMPRESSION: Osseous demineralization. Displaced subcapital fracture left femur.   Original Report Authenticated By: Ulyses Southward, M.D.    CBC  Recent Labs Lab 11/30/12 2302 12/01/12 0520  WBC 8.6 7.5  HGB 12.3* 11.8*  HCT 36.1* 35.1*  PLT 161 153  MCV 91.9 93.9  MCH 31.3 31.6  MCHC 34.1 33.6  RDW 12.9 13.2  LYMPHSABS 1.3  --   MONOABS 1.0  --   EOSABS 0.1  --   BASOSABS 0.0  --     Chemistries   Recent Labs Lab 11/30/12 2302 12/01/12 0520  NA 142 143  K 3.9 4.1  CL 106 108  CO2 24 26  GLUCOSE 139* 166*  BUN 18 16  CREATININE 1.26 1.17  CALCIUM 9.5 8.7  AST 23  --   ALT 16  --   ALKPHOS 84  --   BILITOT 0.7  --    ------------------------------------------------------------------------------------------------------------------ estimated creatinine clearance is 51 ml/min (by C-G formula based on Cr of  1.17). ------------------------------------------------------------------------------------------------------------------ No results found for this basename: HGBA1C,  in the last 72 hours ------------------------------------------------------------------------------------------------------------------ No results found for this basename: CHOL, HDL, LDLCALC, TRIG, CHOLHDL, LDLDIRECT,  in the last 72 hours ------------------------------------------------------------------------------------------------------------------ No results found for this basename: TSH, T4TOTAL, FREET3, T3FREE, THYROIDAB,  in the last 72 hours ------------------------------------------------------------------------------------------------------------------ No results found for this basename: VITAMINB12, FOLATE, FERRITIN, TIBC, IRON, RETICCTPCT,  in the last 72 hours  Coagulation profile No results found for this basename: INR, PROTIME,  in the last 168 hours  No results found for this basename: DDIMER,  in the last 72 hours  Cardiac Enzymes No results found for this basename: CK, CKMB, TROPONINI, MYOGLOBIN,  in the last 168 hours ------------------------------------------------------------------------------------------------------------------ No components found with this basename: POCBNP,

## 2012-12-01 NOTE — H&P (Signed)
[facility] History and Physical  Antionne Enrique ZOX:[id number] DOB: [birth year] DOA: 11/30/2012  Referring physician:  EDP PCP: No primary provider on file.  Specialists:   Chief Complaint: left Hip Pain after Fall  HPI: George Kemp is a 79 y.o. male resident of the [facility] who was brought to the ED after suffering a fall onto his left side.  He reports being in his bathroom, and reached out to grasp the bathroom rail and he slipped and fell onto his left side.   He had increased pain in his left hip and was unable to bear weight on his left foot.  He denied having any chest pain or dizziness or syncope associated with his fall.  He was sent to the ED for further evaluation and was found to have a closed fracture of the left hip.  Orthopedics Dr.supple was consulted and evaluated the patient in the ED and scheduled for surgery in the AM on 07/05.      Review of Systems: The patient denies anorexia, fever, chills, headaches, weight loss, vision loss, diplopia, dizziness, decreased hearing, rhinitis, hoarseness, chest pain, syncope, dyspnea on exertion, peripheral edema, balance deficits, cough, hemoptysis, abdominal pain, nausea, vomiting, diarrhea, constipation, hematemesis, melena, hematochezia, severe indigestion/heartburn, dysuria, hematuria, incontinence, muscle weakness, suspicious skin lesions, transient blindness, depression, unusual weight change, abnormal bleeding, enlarged lymph nodes, angioedema, and breast masses.   Past Medical History  Diagnosis Date  . Stroke 08/2009    Left side with residual deficit  . Hypertension   . Bipolar 1 disorder   . Dementia   . Diabetes     History reviewed. No pertinent past surgical history.  Prior to Admission medications   Medication Sig Start Date End Date Taking? Authorizing Provider  acetaminophen (TYLENOL) 325 MG tablet Take 650 mg by mouth every 6 (six) hours as needed for pain.   Yes Historical Provider, MD  amLODipine  (NORVASC) 5 MG tablet Take 5 mg by mouth every morning.   Yes Historical Provider, MD  atorvastatin (LIPITOR) 20 MG tablet Take 20 mg by mouth every morning.   Yes Historical Provider, MD  cholecalciferol (VITAMIN D) 1000 UNITS tablet Take 1,000 Units by mouth every morning.   Yes Historical Provider, MD  clomiPRAMINE (ANAFRANIL) 25 MG capsule Take 25 mg by mouth every morning.   Yes Historical Provider, MD  clopidogrel (PLAVIX) 75 MG tablet Take 75 mg by mouth daily.   Yes Historical Provider, MD  donepezil (ARICEPT) 10 MG tablet Take 10 mg by mouth daily.   Yes Historical Provider, MD  latanoprost (XALATAN) 0.005 % ophthalmic solution Place 1 drop into both eyes at bedtime.   Yes Historical Provider, MD  loratadine (CLARITIN) 10 MG tablet Take 10 mg by mouth daily as needed for allergies.   Yes Historical Provider, MD  Multiple Vitamin (MULTIVITAMIN WITH MINERALS) TABS Take 1 tablet by mouth daily.   Yes Historical Provider, MD  polyethylene glycol (MIRALAX / GLYCOLAX) packet Take 17 g by mouth every other day.   Yes Historical Provider, MD  QUEtiapine (SEROQUEL) 25 MG tablet Take 25 mg by mouth at bedtime.   Yes Historical Provider, MD  tamsulosin (FLOMAX) 0.4 MG CAPS Take 0.4 mg by mouth daily after breakfast.   Yes Historical Provider, MD    No Known Allergies  Social History:  reports that he has never smoked. He does not have any smokeless tobacco history on file. He reports that he does not drink alcohol or use illicit drugs.  Family History  Problem Relation Age of Onset  . Stroke Mother   . Stroke Brother      X 2  . Congestive Heart Failure Mother   . Diabetes Brother   . Cancer - Other Father     Unknown  Type    (be sure to complete)   Physical Exam:  GEN:  Pleasant well developed Elderly 79 y.o.Caucasian  male  examined  and in discomfort but no acute distress; cooperative with exam Filed Vitals:   11/30/12 2140 11/30/12 2349  BP: 134/79 151/77  Pulse: 95 101   Temp: 97.8 F (36.6 C)   TempSrc: Oral   Resp: 21 18  SpO2: 99% 98%   Blood pressure 151/77, pulse 101, temperature 97.8 F (36.6 C), temperature source Oral, resp. rate 18, SpO2 98.00%. PSYCH: He is alert and oriented x4; does not appear anxious does not appear depressed; affect is normal HEENT: Normocephalic and Atraumatic, Mucous membranes pink; PERRLA; EOM intact; Fundi:  Benign;  No scleral icterus, Nares: Patent, Oropharynx: Clear, Fair Dentition, Neck:  FROM, no cervical lymphadenopathy nor thyromegaly or carotid bruit; no JVD; Breasts:: Not examined CHEST WALL: No tenderness CHEST: Normal respiration, clear to auscultation bilaterally HEART: Regular rate and rhythm; no murmurs rubs or gallops BACK: No kyphosis or scoliosis; no CVA tenderness ABDOMEN: Positive Bowel Sounds,  soft non-tender; no masses, no organomegaly.   Rectal Exam: Not done EXTREMITIES: No cyanosis, clubbing or edema; no ulcerations. Genitalia: not examined PULSES: 2+ and symmetric SKIN: Normal hydration no rash or ulceration CNS: Cranial nerves 2-12 grossly intact,  Chronic Left Hemiparesis: unchanged    Labs on Admission:  Basic Metabolic Panel:  Recent Labs Lab 11/30/12 2302  NA 142  K 3.9  CL 106  CO2 24  GLUCOSE 139*  BUN 18  CREATININE 1.26  CALCIUM 9.5   Liver Function Tests:  Recent Labs Lab 11/30/12 2302  AST 23  ALT 16  ALKPHOS 84  BILITOT 0.7  PROT 6.4  ALBUMIN 3.7   No results found for this basename: LIPASE, AMYLASE,  in the last 168 hours No results found for this basename: AMMONIA,  in the last 168 hours CBC:  Recent Labs Lab 11/30/12 2302  WBC 8.6  NEUTROABS 6.1  HGB 12.3*  HCT 36.1*  MCV 91.9  PLT 161   Cardiac Enzymes: No results found for this basename: CKTOTAL, CKMB, CKMBINDEX, TROPONINI,  in the last 168 hours  BNP (last 3 results) No results found for this basename: PROBNP,  in the last 8760 hours CBG: No results found for this basename: GLUCAP,   in the last 168 hours  Radiological Exams on Admission: Dg Chest 2 View  12/01/2012   *RADIOLOGY REPORT*  Clinical Data: Pain.  History of stroke and hypertension.  Preop left hip pain.  CHEST - 2 VIEW  Comparison: None.  Findings: Shallow inspiration with linear atelectasis in the lung bases.  No blunting of costophrenic angles.  No pneumothorax. Mediastinal contours appear intact.  Normal heart size and pulmonary vascularity.  Degenerative changes in the spine.  IMPRESSION: Shallow inspiration with atelectasis in the lung bases.   Original Report Authenticated By: Burman Nieves, M.D.   Dg Pelvis 1-2 Views  11/30/2012   *RADIOLOGY REPORT*  Clinical Data: Left hip pain.  PELVIS - 1-2 VIEW  Comparison: None.  Findings: Subcapital fracture of the left hip varus angulation of the fracture fragments.  No dislocation.  Degenerative changes in both hip.  The pelvis appears intact.  The pelvic rim, SI joints, and symphysis are not displaced.  No focal bone lesion appreciated. Vascular calcifications.  IMPRESSION: Subcapital fracture of the proximal left femur with varus angulation of the fracture fragments.  No displaced pelvic fractures identified.   Original Report Authenticated By: Burman Nieves, M.D.   Dg Femur Left  11/30/2012   *RADIOLOGY REPORT*  Clinical Data: Fall  LEFT FEMUR - 2 VIEW  Comparison: None  Findings: Osseous demineralization. Displaced subcapital fracture left femur. Left hip joint space narrowing. No additional fracture, dislocation or bone destruction. Scattered atherosclerotic calcifications. Visualized portion of the left pelvis intact.  IMPRESSION: Osseous demineralization. Displaced subcapital fracture left femur.   Original Report Authenticated By: Ulyses Southward, M.D.     EKG: Ordered   Assessment/Plan Principal Problem:   Closed left hip fracture Active Problems:   Fall due to stumbling   HTN (hypertension)   Diabetes   Dementia    1.  Closed Left Hip Fracture-  For  Orthopedic Surgery in AM by Dr. Edwyna Shell Dr. Shelle Iron on 07/05.   Pain Control PRN with IV Dilaudid.     2.  Fall due to Stumbling/Slipping-  Due to Chronic Left Sided Weakness from his CVA hx.   Bedrest and Fall Precautions.    3.  HTN- IV hydralazine PRN SBP > 160, resume Amlodipine post surgically.    4.  Diabetes- Check HbA1c in AM, and SSI coverage PRN.     5.  Dementia - No acute changes, stable.    6.  SCDs for DVT Prophylaxis.     7.  Labs and EKG ordered.       Code Status:   FULL CODE Family Communication:  Daughter and Son In [place] at Bedside   Disposition Plan:    Return to SNF     Time spent:  63 Minutes  Ron Parker [facility] Pager [phone]  If 7PM-7AM, please contact night-coverage [email or web address] Password [email or web address] 12/01/2012, 12:04 AM

## 2012-12-01 NOTE — Anesthesia Preprocedure Evaluation (Signed)
Anesthesia Evaluation  Patient identified by MRN, date of birth, ID band Patient awake    Reviewed: Allergy & Precautions, H&P , Patient's Chart, lab work & pertinent test results, reviewed documented beta blocker date and time   History of Anesthesia Complications Negative for: history of anesthetic complications  Airway Mallampati: II TM Distance: >3 FB Neck ROM: full    Dental no notable dental hx.    Pulmonary neg pulmonary ROS,  breath sounds clear to auscultation  Pulmonary exam normal       Cardiovascular Exercise Tolerance: Good hypertension, negative cardio ROS  Rhythm:regular Rate:Normal     Neuro/Psych PSYCHIATRIC DISORDERS Bipolar Disorder CVA negative neurological ROS  negative psych ROS   GI/Hepatic negative GI ROS, Neg liver ROS,   Endo/Other  negative endocrine ROSdiabetes  Renal/GU negative Renal ROS     Musculoskeletal   Abdominal   Peds  Hematology negative hematology ROS (+)   Anesthesia Other Findings Stroke 08/2009 Left side with residual deficit Hypertension        Bipolar 1 disorder     Dementia        Diabetes          Reproductive/Obstetrics negative OB ROS                           Anesthesia Physical Anesthesia Plan  ASA: III and emergent  Anesthesia Plan: General ETT   Post-op Pain Management:    Induction:   Airway Management Planned:   Additional Equipment:   Intra-op Plan:   Post-operative Plan:   Informed Consent: I have reviewed the patients History and Physical, chart, labs and discussed the procedure including the risks, benefits and alternatives for the proposed anesthesia with the patient or authorized representative who has indicated his/her understanding and acceptance.   Dental Advisory Given  Plan Discussed with: CRNA and Surgeon  Anesthesia Plan Comments:         Anesthesia Quick Evaluation

## 2012-12-01 NOTE — H&P (View-Only) (Signed)
Subjective: Left hip pain   Objective: Vital signs in last 24 hours: Temp:  [97.8 F (36.6 C)-98.2 F (36.8 C)] 98.2 F (36.8 C) (07/05 0641) Pulse Rate:  [95-110] 110 (07/05 0641) Resp:  [18-21] 18 (07/05 0641) BP: (121-151)/(65-86) 121/65 mmHg (07/05 0641) SpO2:  [97 %-100 %] 98 % (07/05 0641) Weight:  [77.747 kg (171 lb 6.4 oz)] 77.747 kg (171 lb 6.4 oz) (07/05 0746)  Intake/Output from previous day: 07/04 0701 - 07/05 0700 In: 0  Out: 450 [Urine:450] Intake/Output this shift:     Recent Labs  11/30/12 2302 12/01/12 0520  HGB 12.3* 11.8*    Recent Labs  11/30/12 2302 12/01/12 0520  WBC 8.6 7.5  RBC 3.93* 3.74*  HCT 36.1* 35.1*  PLT 161 153    Recent Labs  11/30/12 2302 12/01/12 0520  NA 142 143  K 3.9 4.1  CL 106 108  CO2 24 26  BUN 18 16  CREATININE 1.26 1.17  GLUCOSE 139* 166*  CALCIUM 9.5 8.7   No results found for this basename: LABPT, INR,  in the last 72 hours  Neurologically intact Intact pulses distally Compartment soft  Assessment/Plan: Left hip fracture. Plan left hip hemiartgroplasty. Risks discussed.   Sarahjane Matherly C 12/01/2012, 7:54 AM      

## 2012-12-01 NOTE — Brief Op Note (Addendum)
11/30/2012 - 12/01/2012  12:06 PM  PATIENT:  Webb Silversmith  79 y.o. male  PRE-OPERATIVE DIAGNOSIS:  left hip fracture  POST-OPERATIVE DIAGNOSIS:  left hip fracture  PROCEDURE:  Procedure(s): ARTHROPLASTY BIPOLAR HIP (Left)  SURGEON:  Surgeon(s) and Role:    * Javier Docker, MD - Primary  PHYSICIAN ASSISTANT:   ASSISTANTS: Bissell   ANESTHESIA:   general  EBL:  Total I/O In: 1000 [I.V.:1000] Out: 700 [Urine:300; Blood:400]  BLOOD ADMINISTERED:none  DRAINS: none   LOCAL MEDICATIONS USED:  NONE  SPECIMEN:  No Specimen  DISPOSITION OF SPECIMEN:  N/A  COUNTS:  YES  TOURNIQUET:  * No tourniquets in log *  DICTATION: .Other Dictation: Dictation Number [id number]  PLAN OF CARE: Admit to inpatient   PATIENT DISPOSITION:  PACU - hemodynamically stable.   Delay start of Pharmacological VTE agent (>24hrs) due to surgical blood loss or risk of bleeding: no Per pharmacy plavix with 81mg  ASA for both DVT and CVA prophylaxis

## 2012-12-01 NOTE — Interval H&P Note (Signed)
History and Physical Interval Note:  12/01/2012 9:54 AM  George Kemp  has presented today for surgery, with the diagnosis of left hip fracture  The various methods of treatment have been discussed with the patient and family. After consideration of risks, benefits and other options for treatment, the patient has consented to  Procedure(s): ARTHROPLASTY BIPOLAR HIP (Left) as a surgical intervention .  The patient's history has been reviewed, patient examined, no change in status, stable for surgery.  I have reviewed the patient's chart and labs.  Questions were answered to the patient's satisfaction.     Milanni Ayub C

## 2012-12-01 NOTE — Transfer of Care (Signed)
Immediate Anesthesia Transfer of Care Note  Patient: George Kemp  Procedure(s) Performed: Procedure(s): ARTHROPLASTY BIPOLAR HIP (Left)  Patient Location: PACU  Anesthesia Type:General  Level of Consciousness: awake and alert   Airway & Oxygen Therapy: Patient Spontanous Breathing and Patient connected to face mask oxygen  Post-op Assessment: Report given to PACU RN and Post -op Vital signs reviewed and stable  Post vital signs: Reviewed and stable  Complications: No apparent anesthesia complications

## 2012-12-01 NOTE — Progress Notes (Signed)
pacu nursing note:  Metoprolol given as per orders for heart rate greater than 110. (126) Blood pressure interval changed to q5 minutes Will continue to monitor

## 2012-12-01 NOTE — Anesthesia Postprocedure Evaluation (Signed)
  Anesthesia Post-op Note  Patient: George Kemp  Procedure(s) Performed: Procedure(s): ARTHROPLASTY BIPOLAR HIP (Left)  Patient Location: PACU  Anesthesia Type:General  Level of Consciousness: awake and alert   Airway and Oxygen Therapy: Patient Spontanous Breathing and Patient connected to nasal cannula oxygen  Post-op Pain: none  Post-op Assessment: Post-op Vital signs reviewed and Patient's Cardiovascular Status Stable  Post-op Vital Signs: Reviewed and stable  Complications: No apparent anesthesia complications

## 2012-12-01 NOTE — Progress Notes (Signed)
Subjective: Left hip pain   Objective: Vital signs in last 24 hours: Temp:  [97.8 F (36.6 C)-98.2 F (36.8 C)] 98.2 F (36.8 C) (07/05 0641) Pulse Rate:  [95-110] 110 (07/05 0641) Resp:  [18-21] 18 (07/05 0641) BP: (121-151)/(65-86) 121/65 mmHg (07/05 0641) SpO2:  [97 %-100 %] 98 % (07/05 0641) Weight:  [77.747 kg (171 lb 6.4 oz)] 77.747 kg (171 lb 6.4 oz) (07/05 0746)  Intake/Output from previous day: 07/04 0701 - 07/05 0700 In: 0  Out: 450 [Urine:450] Intake/Output this shift:     Recent Labs  11/30/12 2302 12/01/12 0520  HGB 12.3* 11.8*    Recent Labs  11/30/12 2302 12/01/12 0520  WBC 8.6 7.5  RBC 3.93* 3.74*  HCT 36.1* 35.1*  PLT 161 153    Recent Labs  11/30/12 2302 12/01/12 0520  NA 142 143  K 3.9 4.1  CL 106 108  CO2 24 26  BUN 18 16  CREATININE 1.26 1.17  GLUCOSE 139* 166*  CALCIUM 9.5 8.7   No results found for this basename: LABPT, INR,  in the last 72 hours  Neurologically intact Intact pulses distally Compartment soft  Assessment/Plan: Left hip fracture. Plan left hip hemiartgroplasty. Risks discussed.   Karol Skarzynski C 12/01/2012, 7:54 AM

## 2012-12-01 NOTE — Discharge Instructions (Signed)
Partial weightbearing left leg Immobilizer left leg Posterior hip precautions Plavix 75mg  daily and ASA 81mg  daily for DVT ppx Aquacel dressing may remain in place x 7 days. May shower with aquacel in place. After 7 days, remove aquacel and place gauze and tape dressing which should be kept clean and dry and changed daily Follow up in 10-14 days with Dr. Shelle Iron for staple removal

## 2012-12-02 DIAGNOSIS — F0390 Unspecified dementia without behavioral disturbance: Secondary | ICD-10-CM

## 2012-12-02 DIAGNOSIS — S72009A Fracture of unspecified part of neck of unspecified femur, initial encounter for closed fracture: Principal | ICD-10-CM

## 2012-12-02 DIAGNOSIS — E119 Type 2 diabetes mellitus without complications: Secondary | ICD-10-CM

## 2012-12-02 LAB — GLUCOSE, CAPILLARY: Glucose-Capillary: 169 mg/dL — ABNORMAL HIGH (ref 70–99)

## 2012-12-02 LAB — CBC
HCT: 28.6 % — ABNORMAL LOW (ref 39.0–52.0)
MCHC: 32.2 g/dL (ref 30.0–36.0)
Platelets: 135 10*3/uL — ABNORMAL LOW (ref 150–400)
RDW: 13.6 % (ref 11.5–15.5)
WBC: 11.2 10*3/uL — ABNORMAL HIGH (ref 4.0–10.5)

## 2012-12-02 LAB — BASIC METABOLIC PANEL
BUN: 15 mg/dL (ref 6–23)
Chloride: 108 mEq/L (ref 96–112)
GFR calc Af Amer: 54 mL/min — ABNORMAL LOW (ref >90)
GFR calc non Af Amer: 46 mL/min — ABNORMAL LOW (ref >90)
Potassium: 4.4 mEq/L (ref 3.5–5.1)
Sodium: 140 mEq/L (ref 135–145)

## 2012-12-02 MED ORDER — INSULIN ASPART 100 UNIT/ML ~~LOC~~ SOLN
0.0000 [IU] | Freq: Three times a day (TID) | SUBCUTANEOUS | Status: DC
  Administered 2012-12-02: 3 [IU] via SUBCUTANEOUS
  Administered 2012-12-02 – 2012-12-03 (×2): 2 [IU] via SUBCUTANEOUS

## 2012-12-02 MED ORDER — INSULIN ASPART 100 UNIT/ML ~~LOC~~ SOLN
3.0000 [IU] | Freq: Three times a day (TID) | SUBCUTANEOUS | Status: DC
  Administered 2012-12-02 – 2012-12-03 (×3): 3 [IU] via SUBCUTANEOUS

## 2012-12-02 NOTE — Progress Notes (Signed)
HR has remained in the 113-120 beats/min range for rest of night.

## 2012-12-02 NOTE — Progress Notes (Signed)
Clinical Social Work Department BRIEF PSYCHOSOCIAL ASSESSMENT 12/02/2012  Patient:  George Kemp, George Kemp     Account Number:  [id number]     Admit date:  11/30/2012  Clinical Social Worker:  Doroteo Glassman  Date/Time:  12/02/2012 10:48 AM  Referred by:  Physician  Date Referred:  12/02/2012 Referred for  SNF Placement   Other Referral:   Interview type:  Patient Other interview type:   Pt's son, Loraine Leriche    PSYCHOSOCIAL DATA Living Status:  FACILITY Admitted from facility:  [facility] Level of care:  Skilled Nursing Facility Primary support name:  Loraine Leriche Primary support relationship to patient:  CHILD, ADULT Degree of support available:   strong    CURRENT CONCERNS Current Concerns  Post-Acute Placement   Other Concerns:    SOCIAL WORK ASSESSMENT / PLAN Met with Pt and son at bedside.    Pt and son reported that Pt has been at the skilled side of [facility] since Jan due to a stroke.  Pt will return there upon d/c.    Contacted [facility].  Ok for Pt to return upon d/c.    Weekday CSW to follow.   Assessment/plan status:  Psychosocial Support/Ongoing Assessment of Needs Other assessment/ plan:   Information/referral to community resources:   n/a    PATIENT'S/FAMILY'S RESPONSE TO PLAN OF CARE: Pt happy to return to [facility] upon d/c.   Providence Crosby, LCSWA Clinical Social Work [phone]

## 2012-12-02 NOTE — Evaluation (Signed)
Physical Therapy Evaluation Patient Details Name: George Kemp MRN: [id number] DOB: 01-Jun-1926 Today's Date: 12/02/2012 Time: 7829-5621 PT Time Calculation (min): 26 min  PT Assessment / Plan / Recommendation History of Present Illness  Pt with L fem fracture s/p bipolar hip arthroplasty with history of L sided weakness from CVA.  Was at [facility] SNF side for rehab and was mostly w/c bound, however was ambulating some with PT using hemi walker.  During this eval, note that pt very confused, which is not pts baseline.  RN feels it is from meds.   Clinical Impression  Pt able to tolerate sit to stand x 2 reps then stand pivot to chair with +2 assist going towards R side.  Performed with pts arms around therapist on one side then around techs shoulders on other side.  Pt will benefit from skilled PT in acute venue to address deficits.  PT recommends returning to SNF at D/C to maximize pts safety and function.     PT Assessment  Patient needs continued PT services    Follow Up Recommendations  SNF    Does the patient have the potential to tolerate intense rehabilitation      Barriers to Discharge        Equipment Recommendations  None recommended by PT    Recommendations for Other Services OT consult   Frequency Min 3X/week    Precautions / Restrictions Precautions Precautions: Fall;Posterior Hip Precaution Comments: Spoke with RN regarding posterior hip precautions and wrote them on pts white board.   Required Braces or Orthoses: Knee Immobilizer - Left Knee Immobilizer - Left:  (Maintained during eval due to increased confusion) Restrictions Weight Bearing Restrictions: Yes LLE Weight Bearing: Partial weight bearing LLE Partial Weight Bearing Percentage or Pounds: 50%   Pertinent Vitals/Pain 4/10 per PAINAD      Mobility  Bed Mobility Bed Mobility: Supine to Sit Supine to Sit: 1: +2 Total assist;HOB elevated Supine to Sit: Patient Percentage: 30% Details for Bed  Mobility Assistance: Pt requires assist for BLEs out of bed and for trunk to attain sitting position.  Also utilized bed pad to scoot hips closer to EOB.  Provided max cues for pt to self assist, however pt with very severe posterior lean getting into seated position with max cues to self correct.  Transfers Transfers: Sit to Stand;Stand to Sit;Stand Pivot Transfers Sit to Stand: 1: +2 Total assist;From elevated surface;From bed Sit to Stand: Patient Percentage: 20% Stand to Sit: 1: +2 Total assist;To chair/3-in-1 Stand to Sit: Patient Percentage: 20% Stand Pivot Transfers: 1: +2 Total assist Stand Pivot Transfers: Patient Percentage: 20% Details for Transfer Assistance: Assist to rise, maintain upright stance and ensure controlled descent.  Max cues for forward leaning when standing and also for RLE placement.  maintained L knee immobilizer due to pts increased confusion and decreased ability to follow THP.  Performed stand pivot transfer towards pts R side with pts arm around therapist shoulders on each side.   Ambulation/Gait Ambulation/Gait Assistance: Not tested (comment) Assistive device:  (2 person "3 muskateer style" see transfer note) Stairs: No Wheelchair Mobility Wheelchair Mobility: No    Exercises     PT Diagnosis: Difficulty walking;Generalized weakness;Acute pain  PT Problem List: Decreased strength;Decreased range of motion;Decreased activity tolerance;Decreased balance;Decreased mobility;Decreased coordination;Decreased cognition;Decreased knowledge of use of DME;Decreased safety awareness;Decreased knowledge of precautions PT Treatment Interventions: DME instruction;Functional mobility training;Therapeutic activities;Therapeutic exercise;Balance training;Neuromuscular re-education;Patient/family education     PT Goals(Current goals can be found in the care plan  section) Acute Rehab PT Goals Patient Stated Goal: n/a PT Goal Formulation: With patient/family Time For Goal  Achievement: 12/09/12 Potential to Achieve Goals: Fair  Visit Information  Last PT Received On: 12/02/12 Assistance Needed: +2 History of Present Illness: Pt with L fem fracture s/p bipolar hip arthroplasty with history of L sided weakness from CVA.  Was at [facility] SN[facility] side for rehab and was mostly w/c bound, however was ambulating some with PT using hemi walker.  During this eval, note that pt very confused, which is not pts baseline.  RN feels it is from meds.        Prior Functioning  Home Living Family/patient expects to be discharged to:: Skilled nursing facility Prior Function Level of Independence: Needs assistance Gait / Transfers Assistance Needed: Pt was transferring with assistance to w/c, bed, etc.  Ambulating some with hemi walker in PT.   ADL's / Homemaking Assistance Needed: Needed assistance for bathing and dressing Communication / Swallowing Assistance Needed: Pt was able to feed himself.     Cognition  Cognition Arousal/Alertness: Suspect due to medications Behavior During Therapy: WFL for tasks assessed/performed Overall Cognitive Status: Impaired/Different from baseline Area of Impairment: Orientation;Memory;Following commands Orientation Level: Disoriented to;Place;Time Memory: Decreased recall of precautions Following Commands: Follows one step commands inconsistently General Comments: Pt very confused during session and makes very out of the ordinary remarks.  RN aware.     Extremity/Trunk Assessment Upper Extremity Assessment Upper Extremity Assessment: LUE deficits/detail LUE Deficits / Details: pt with very limited grip strength on LUE and unable to move UE without assist from RUE>  Lower Extremity Assessment Lower Extremity Assessment: LLE deficits/detail LLE Deficits / Details: unable to fully assess due to pain and precautions, however note that L side weaker PTA due to CVA.  Cervical / Trunk Assessment Cervical / Trunk Assessment: Other  exceptions Cervical / Trunk Exceptions: Note that pt kept head turned to right.  Could correct with cues but not maintain.    Balance    End of Session PT - End of Session Equipment Utilized During Treatment: Left knee immobilizer;Gait belt Activity Tolerance: Patient limited by pain Patient left: in chair;with call bell/phone within reach;with chair alarm set Nurse Communication: Mobility status;Need for lift equipment  GP     Vista Deck 12/02/2012, 2:14 PM

## 2012-12-02 NOTE — Progress Notes (Signed)
Notified MD on call of sustained HR in 130's for greater than 1/2 hour.  Pt with no other symptoms, appears restful.  BP 115/57 at time of notification.  EKG obtained per orders and 2.5mg  Lopressor given per charge RN, Inetta Fermo.  Provider on-call notified of EKG result and another 2.5mg  Lopressor ordered and given.  HR currently 117 on monitor.  Will continue to monitor.

## 2012-12-02 NOTE — Op Note (Signed)
NAMEMarland Kitchen  George Kemp, George Kemp                 ACCOUNT NO.:  [id number]  MEDICAL RECORD NO.:  [id number]  LOCATION:  1444                         FACILITY:  [facility]  PHYSICIAN:  Jene Every, M.D.    DATE OF BIRTH:  09-06-1926  DATE OF PROCEDURE: DATE OF DISCHARGE:                              OPERATIVE REPORT   PREOPERATIVE DIAGNOSIS:  Left femoral neck fracture, displaced.  POSTOPERATIVE DIAGNOSIS:  Left femoral neck fracture, displaced.  PROCEDURE PERFORMED:  Left hip hemiarthroplasty, utilizing DePuy AML 13.5 femur, 1.5 neck, and a 53 bipolar assembly.  PROSTHETIC DEMAND:  The patient was an ambulator with minimal comorbidities.  Higher demand.  ASSISTANT:  Lanna Poche, PA.  HISTORY:  An 101, fell fractured, indicated for replacement, displaced femoral neck.  Risk and benefits were discussed including bleeding, infection, damage to neurovascular structures, DVT, PE, leg length discrepancy, dislocation, etc.  TECHNIQUE:  With the patient in supine position and  after induction of adequate anesthesia and 2 g Kefzol, placed in a right lateral decubitus position.  All bony prominences were well padded.  Hip holder utilized. Well leg secured with gentle flexion.  The left hip peritrochanteric region of lower extremity was prepped and draped in the usual sterile fashion.  Standard posterolateral approach to the hip was then utilized with incision based on the trochanter.  Subcutaneous tissue was dissected.  Electrocautery was utilized to achieve hemostasis.  The tensor fascia was divided in line with the skin incision.  The adductor tenotomy performed.  Internally rotated, identified the piriformis, tagged, and reflected posteriorly.  The remainder of the external rotators were then protected posteriorly to protect the sciatic nerve throughout the case.  We used then a beaver identifying the capsule, performed a T-shaped capsulotomy.  The inferior leaflet, however, was torn and not  sufficient the superior leaflet was.  The femoral neck fracture was identified.  The hip was dislocated.  Self retainers placed, used an oscillating saw and performed an osteotomy 1 fingerbreadth above the femoral neck.  Then, using an initiator and a box chisel and entered the femoral canal, sequentially reaming it to good cortical contact lateralizing it during the process at 13.5 mm and I then broached the appropriate anteversion from 10-12.5-13.5.  Used a large of the small was insufficient with good purchase.  Following this, we removed the head, debrided the acetabulum, cauterized any vessels. We did throughout the case.  There was no aggressive bleeding.  He was on Plavix.  I measured the head at 53.  We then impacted the AML permanent prosthesis after irrigation into place with excellent purchase.  We then reduced it with a 1.5 neck, bipolar assembling was stable throughout a full range with good leg lengths.  We then redislocated, removed the trial and cleaned the trunnion and placed a permanent bipolar assembly, impacted into place.  We protect the sciatic nerve throughout the case with reductions as well.  The hip was reduced and found to be stable throughout a full range specifically with flexion and internal rotation.  Leg lengths were equivalent and good stability was noted.  Copiously irrigated the wound.  Electrocautery was utilized to achieve hemostasis.  The gentle oozing  from the canal, we did then put some FloSeal around that area into the wound for better hemostatic control.  We then palpated the course of the sciatic nerve, it was intact.  The superior leaflet was intact.  We repaired the adductor tenotomy with 1 Ethibond.  The tensor fascia with #1 Vicryl interrupted figure-of-8 sutures to subcutaneous with 2 skin staples, wound was dressed sterilely.  Placed supine on the hospital bed, and knee immobilizer placed, equivalent leg lengths, good pulses, extubated,  and transferred to the recovery room in satisfactory condition.  The patient tolerated the procedure well.  There were no complications. Blood loss 400 mL.  Urine output 300.     Jene Every, M.D.     Cordelia Pen  D:  12/01/2012  T:  12/02/2012  Job:  [id number]

## 2012-12-02 NOTE — Progress Notes (Signed)
Subjective: 1 Day Post-Op Procedure(s) (LRB): ARTHROPLASTY BIPOLAR HIP (Left) Patient reports pain as 4 on 0-10 scale.    Objective: Vital signs in last 24 hours: Temp:  [97.6 F (36.4 C)-99.6 F (37.6 C)] 99.6 F (37.6 C) (07/06 0600) Pulse Rate:  [96-146] 121 (07/06 0600) Resp:  [11-20] 18 (07/06 0600) BP: (98-145)/(47-93) 102/47 mmHg (07/06 0600) SpO2:  [91 %-100 %] 92 % (07/06 0600)  Intake/Output from previous day: 07/05 0701 - 07/06 0700 In: 4219.3 [P.O.:440; I.V.:3779.3] Out: 1850 [Urine:1450; Blood:400] Intake/Output this shift:     Recent Labs  11/30/12 2302 12/01/12 0520 12/02/12 0520  HGB 12.3* 11.8* 9.2*    Recent Labs  12/01/12 0520 12/02/12 0520  WBC 7.5 11.2*  RBC 3.74* 2.97*  HCT 35.1* 28.6*  PLT 153 135*    Recent Labs  12/01/12 0520 12/02/12 0520  NA 143 140  K 4.1 4.4  CL 108 108  CO2 26 26  BUN 16 15  CREATININE 1.17 1.36*  GLUCOSE 166* 211*  CALCIUM 8.7 8.4   No results found for this basename: LABPT, INR,  in the last 72 hours  Still weak DF PF from stroke. No change No DVT.  Assessment/Plan: 1 Day Post-Op Procedure(s) (LRB): ARTHROPLASTY BIPOLAR HIP (Left) Advance diet Up with therapy  Kailen Hinkle C 12/02/2012, 8:47 AM

## 2012-12-02 NOTE — Progress Notes (Signed)
[facility] PROGRESS NOTE  George Kemp WUJ:[id number] DOB: September 14, 1926 DOA: 11/30/2012 PCP: No primary provider on file.  Assessment/Plan: Left Hip Fracture -Per ortho. -S/p left hemiarthroplasty on 7/5. -Plan to return to SNF in am.  DM -SSI.  Dementia -At baseline.  Code Status: Full code Family Communication: Son at bedside  Disposition Plan: SNF on Monday.   Consultants:  Ortho, Dr. Shelle Iron   Antibiotics:  None   Subjective: No complaints  Objective: Filed Vitals:   12/02/12 0120 12/02/12 0224 12/02/12 0400 12/02/12 0600  BP: 106/67 98/60  102/47  Pulse: 120 123  121  Temp:  99.6 F (37.6 C)  99.6 F (37.6 C)  TempSrc:  Oral  Oral  Resp:  16 18 18   Height:      Weight:      SpO2:  91% 94% 92%    Intake/Output Summary (Last 24 hours) at 12/02/12 1431 Last data filed at 12/02/12 1228  Gross per 24 hour  Intake 2239.25 ml  Output   1150 ml  Net 1089.25 ml   Filed Weights   12/01/12 0746  Weight: 77.747 kg (171 lb 6.4 oz)    Exam:   General:  AA Ox3  Cardiovascular: RRR  Respiratory: CTA B  Abdomen: S/NT/ND/+BS  Extremities: left leg immobilizer   Neurologic:  Non-focal.  Data Reviewed: Basic Metabolic Panel:  Recent Labs Lab 11/30/12 2302 12/01/12 0520 12/02/12 0520  NA 142 143 140  K 3.9 4.1 4.4  CL 106 108 108  CO2 24 26 26   GLUCOSE 139* 166* 211*  BUN 18 16 15   CREATININE 1.26 1.17 1.36*  CALCIUM 9.5 8.7 8.4   Liver Function Tests:  Recent Labs Lab 11/30/12 2302  AST 23  ALT 16  ALKPHOS 84  BILITOT 0.7  PROT 6.4  ALBUMIN 3.7   No results found for this basename: LIPASE, AMYLASE,  in the last 168 hours No results found for this basename: AMMONIA,  in the last 168 hours CBC:  Recent Labs Lab 11/30/12 2302 12/01/12 0520 12/02/12 0520  WBC 8.6 7.5 11.2*  NEUTROABS 6.1  --   --   HGB 12.3* 11.8* 9.2*  HCT 36.1* 35.1* 28.6*  MCV 91.9 93.9 96.3  PLT 161 153 135*   Cardiac Enzymes: No results found  for this basename: CKTOTAL, CKMB, CKMBINDEX, TROPONINI,  in the last 168 hours BNP (last 3 results) No results found for this basename: PROBNP,  in the last 8760 hours CBG:  Recent Labs Lab 12/01/12 1806 12/01/12 2135 12/02/12 0635 12/02/12 0729 12/02/12 1148  GLUCAP 154* 163* 180* 169* 224*    Recent Results (from the past 240 hour(s))  MRSA PCR SCREENING     Status: None   Collection Time    12/01/12  6:53 AM      Result Value Range Status   MRSA by PCR NEGATIVE  NEGATIVE Final   Comment:            The GeneXpert MRSA Assay (FDA     approved for NASAL specimens     only), is one component of a     comprehensive MRSA colonization     surveillance program. It is not     intended to diagnose MRSA     infection nor to guide or     monitor treatment for     MRSA infections.     Studies: Dg Chest 2 View  12/01/2012   *RADIOLOGY REPORT*  Clinical Data: Pain.  History of  stroke and hypertension.  Preop left hip pain.  CHEST - 2 VIEW  Comparison: None.  Findings: Shallow inspiration with linear atelectasis in the lung bases.  No blunting of costophrenic angles.  No pneumothorax. Mediastinal contours appear intact.  Normal heart size and pulmonary vascularity.  Degenerative changes in the spine.  IMPRESSION: Shallow inspiration with atelectasis in the lung bases.   Original Report Authenticated By: Burman Nieves, M.D.   Dg Pelvis 1-2 Views  11/30/2012   *RADIOLOGY REPORT*  Clinical Data: Left hip pain.  PELVIS - 1-2 VIEW  Comparison: None.  Findings: Subcapital fracture of the left hip varus angulation of the fracture fragments.  No dislocation.  Degenerative changes in both hip.  The pelvis appears intact.  The pelvic rim, SI joints, and symphysis are not displaced.  No focal bone lesion appreciated. Vascular calcifications.  IMPRESSION: Subcapital fracture of the proximal left femur with varus angulation of the fracture fragments.  No displaced pelvic fractures identified.   Original  Report Authenticated By: Burman Nieves, M.D.   Dg Femur Left  11/30/2012   *RADIOLOGY REPORT*  Clinical Data: Fall  LEFT FEMUR - 2 VIEW  Comparison: None  Findings: Osseous demineralization. Displaced subcapital fracture left femur. Left hip joint space narrowing. No additional fracture, dislocation or bone destruction. Scattered atherosclerotic calcifications. Visualized portion of the left pelvis intact.  IMPRESSION: Osseous demineralization. Displaced subcapital fracture left femur.   Original Report Authenticated By: Ulyses Southward, M.D.   Dg Pelvis Portable  12/01/2012   *RADIOLOGY REPORT*  Clinical Data: Left proximal femur fracture.  PORTABLE PELVIS  Comparison: Radiographs dated 11/30/2012  Findings: The patient has undergone left hemiarthroplasty.  The proximal femoral prosthesis appears in excellent position in the AP projection.  The pelvis is otherwise normal.  IMPRESSION: Satisfactory appearance of the pelvis after the left hip hemiarthroplasty.   Original Report Authenticated By: Francene Boyers, M.D.   Dg Hip Portable 1 View Left  12/01/2012   *RADIOLOGY REPORT*  Clinical Data: Left hip fracture.  PORTABLE LEFT HIP - 1 VIEW  Comparison: Radiographs dated 11/30/2012  Findings: The patient has undergone left hemiarthroplasty.  The proximal left femoral prosthesis appears in excellent position in the AP projection.  No fracture.  IMPRESSION: Satisfactory appearance of the left hip in the AP projection after left hemiarthroplasty.   Original Report Authenticated By: Francene Boyers, M.D.    Scheduled Meds: . aspirin EC  81 mg Oral Q breakfast  . atorvastatin  20 mg Oral q morning - 10a  . cholecalciferol  1,000 Units Oral q morning - 10a  . clomiPRAMINE  25 mg Oral q morning - 10a  . clopidogrel  75 mg Oral Q breakfast  . docusate sodium  100 mg Oral BID  . donepezil  10 mg Oral Daily  . insulin aspart  0-9 Units Subcutaneous TID WC  . insulin aspart  3 Units Subcutaneous TID WC  .  latanoprost  1 drop Both Eyes QHS  . metoprolol tartrate  25 mg Oral BID  . multivitamin with minerals  1 tablet Oral Daily  . polyethylene glycol  17 g Oral QODAY  . QUEtiapine  25 mg Oral QHS  . tamsulosin  0.4 mg Oral QPC breakfast   Continuous Infusions:   Principal Problem:   Closed left hip fracture Active Problems:   Fall due to stumbling   HTN (hypertension)   Diabetes   Dementia    Time spent: 35 minutes.    HERNANDEZ ACOSTA,ESTELA  [facility]  Pager [phone]  If 7PM-7AM, please contact night-coverage at [email or web address], password [facility] 12/02/2012, 2:31 PM  LOS: 2 days

## 2012-12-03 ENCOUNTER — Non-Acute Institutional Stay (SKILLED_NURSING_FACILITY): Payer: Medicare Other | Admitting: Geriatric Medicine

## 2012-12-03 DIAGNOSIS — S72009A Fracture of unspecified part of neck of unspecified femur, initial encounter for closed fracture: Secondary | ICD-10-CM

## 2012-12-03 DIAGNOSIS — C4491 Basal cell carcinoma of skin, unspecified: Secondary | ICD-10-CM

## 2012-12-03 DIAGNOSIS — F0390 Unspecified dementia without behavioral disturbance: Secondary | ICD-10-CM

## 2012-12-03 DIAGNOSIS — F319 Bipolar disorder, unspecified: Secondary | ICD-10-CM

## 2012-12-03 DIAGNOSIS — I6990 Unspecified sequelae of unspecified cerebrovascular disease: Secondary | ICD-10-CM

## 2012-12-03 DIAGNOSIS — E119 Type 2 diabetes mellitus without complications: Secondary | ICD-10-CM

## 2012-12-03 DIAGNOSIS — N289 Disorder of kidney and ureter, unspecified: Secondary | ICD-10-CM

## 2012-12-03 DIAGNOSIS — S72002A Fracture of unspecified part of neck of left femur, initial encounter for closed fracture: Secondary | ICD-10-CM

## 2012-12-03 LAB — GLUCOSE, CAPILLARY
Glucose-Capillary: 155 mg/dL — ABNORMAL HIGH (ref 70–99)
Glucose-Capillary: 175 mg/dL — ABNORMAL HIGH (ref 70–99)

## 2012-12-03 MED ORDER — METOPROLOL TARTRATE 25 MG PO TABS: 25.0000 mg | ORAL_TABLET | Freq: Two times a day (BID) | ORAL | Status: DC

## 2012-12-03 MED ORDER — HYDROCODONE-ACETAMINOPHEN 5-325 MG PO TABS: 1.0000 | ORAL_TABLET | Freq: Four times a day (QID) | ORAL | Status: DC | PRN

## 2012-12-03 MED ORDER — DSS 100 MG PO CAPS: 100.0000 mg | ORAL_CAPSULE | Freq: Two times a day (BID) | ORAL | Status: DC

## 2012-12-03 NOTE — Progress Notes (Signed)
Pt from [facility] and plan is for pt to return at discharge.  Per MD, pt medically stable for discharge today.  CSW contacted [facility] to notify.  CSW to facilitate pt discharge needs this afternoon.   Jacklynn Lewis, MSW, LCSWA (coverage for Unice [place], [place]) Clinical Social Work [phone]

## 2012-12-03 NOTE — Progress Notes (Signed)
Report called to Winferd Humphrey LPN @ [facility]

## 2012-12-03 NOTE — Discharge Summary (Signed)
Physician Discharge Summary  George Kemp ZOX:[id number] DOB: Sep 29, 1926 DOA: 11/30/2012  PCP: No primary provider on file.  Admit date: 11/30/2012 Discharge date: 12/03/2012  Time spent: Greater than 30 minutes  Recommendations for Outpatient Follow-up:  -Will be discharged back to [facility] today for continued rehab following prior CVA and now hip fracture.   Discharge Diagnoses:  Principal Problem:   Closed left hip fracture Active Problems:   Fall due to stumbling   HTN (hypertension)   Diabetes   Dementia   Discharge Condition: Stable and improved.  Filed Weights   12/01/12 0746  Weight: 77.747 kg (171 lb 6.4 oz)    History of present illness:  Patient is an 79 y.o. male resident of the [facility] who was brought to the ED after suffering a fall onto his left side. He reports being in his bathroom, and reached out to grasp the bathroom rail and he slipped and fell onto his left side. He had increased pain in his left hip and was unable to bear weight on his left foot. He denied having any chest pain or dizziness or syncope associated with his fall. He was sent to the ED for further evaluation and was found to have a closed fracture of the left hip. Orthopedics Dr.supple was consulted and evaluated the patient in the ED and scheduled for surgery in the AM on 07/05. We were asked to admit him for further evaluation and management   Hospital Course:   Left Hip Fracture  -Per ortho.  -S/p left hemiarthroplasty on 7/5.  -Plan to return to SNF.   DM  -Well controlled.  Dementia  -At baseline. -No behavioral issues.   Procedures:  Left hip hemiarthroplasty on 7/5.   Consultations:  Ortho, Dr. Shelle Iron  Discharge Instructions  Discharge Orders   Future Orders Complete By Expires     Diet - low sodium heart healthy  As directed     Discontinue IV  As directed     Increase activity slowly  As directed         Medication List         acetaminophen 325 MG  tablet  Commonly known as:  TYLENOL  Take 650 mg by mouth every 6 (six) hours as needed for pain.     amLODipine 5 MG tablet  Commonly known as:  NORVASC  Take 5 mg by mouth every morning.     ANAFRANIL 25 MG capsule  Generic drug:  clomiPRAMINE  Take 25 mg by mouth every morning.     aspirin 81 MG EC tablet  Take 1 tablet (81 mg total) by mouth daily. Swallow whole.     atorvastatin 20 MG tablet  Commonly known as:  LIPITOR  Take 20 mg by mouth every morning.     cholecalciferol 1000 UNITS tablet  Commonly known as:  VITAMIN D  Take 1,000 Units by mouth every morning.     clopidogrel 75 MG tablet  Commonly known as:  PLAVIX  Take 75 mg by mouth daily.     donepezil 10 MG tablet  Commonly known as:  ARICEPT  Take 10 mg by mouth daily.     DSS 100 MG Caps  Take 100 mg by mouth 2 (two) times daily.     HYDROcodone-acetaminophen 5-325 MG per tablet  Commonly known as:  NORCO  Take 1-2 tablets by mouth every 6 (six) hours as needed for pain.     latanoprost 0.005 % ophthalmic solution  Commonly  known as:  XALATAN  Place 1 drop into both eyes at bedtime.     loratadine 10 MG tablet  Commonly known as:  CLARITIN  Take 10 mg by mouth daily as needed for allergies.     metoprolol tartrate 25 MG tablet  Commonly known as:  LOPRESSOR  Take 1 tablet (25 mg total) by mouth 2 (two) times daily.     multivitamin with minerals Tabs  Take 1 tablet by mouth daily.     polyethylene glycol packet  Commonly known as:  MIRALAX / GLYCOLAX  Take 17 g by mouth every other day.     QUEtiapine 25 MG tablet  Commonly known as:  SEROQUEL  Take 25 mg by mouth at bedtime.     tamsulosin 0.4 MG Caps  Commonly known as:  FLOMAX  Take 0.4 mg by mouth daily after breakfast.       No Known Allergies     Follow-up Information   Follow up with BEANE,JEFFREY C, MD In 2 weeks.   Contact information:   [address] [place] [address] [phone]         The results of significant diagnostics from this hospitalization (including imaging, microbiology, ancillary and laboratory) are listed below for reference.    Significant Diagnostic Studies: Dg Chest 2 View  12/01/2012   *RADIOLOGY REPORT*  Clinical Data: Pain.  History of stroke and hypertension.  Preop left hip pain.  CHEST - 2 VIEW  Comparison: None.  Findings: Shallow inspiration with linear atelectasis in the lung bases.  No blunting of costophrenic angles.  No pneumothorax. Mediastinal contours appear intact.  Normal heart size and pulmonary vascularity.  Degenerative changes in the spine.  IMPRESSION: Shallow inspiration with atelectasis in the lung bases.   Original Report Authenticated By: Burman Nieves, M.D.   Dg Pelvis 1-2 Views  11/30/2012   *RADIOLOGY REPORT*  Clinical Data: Left hip pain.  PELVIS - 1-2 VIEW  Comparison: None.  Findings: Subcapital fracture of the left hip varus angulation of the fracture fragments.  No dislocation.  Degenerative changes in both hip.  The pelvis appears intact.  The pelvic rim, SI joints, and symphysis are not displaced.  No focal bone lesion appreciated. Vascular calcifications.  IMPRESSION: Subcapital fracture of the proximal left femur with varus angulation of the fracture fragments.  No displaced pelvic fractures identified.   Original Report Authenticated By: Burman Nieves, M.D.   Dg Femur Left  11/30/2012   *RADIOLOGY REPORT*  Clinical Data: Fall  LEFT FEMUR - 2 VIEW  Comparison: None  Findings: Osseous demineralization. Displaced subcapital fracture left femur. Left hip joint space narrowing. No additional fracture, dislocation or bone destruction. Scattered atherosclerotic calcifications. Visualized portion of the left pelvis intact.  IMPRESSION: Osseous demineralization. Displaced subcapital fracture left femur.   Original Report Authenticated By: Ulyses Southward, M.D.   Dg Pelvis Portable  12/01/2012   *RADIOLOGY REPORT*  Clinical Data: Left  proximal femur fracture.  PORTABLE PELVIS  Comparison: Radiographs dated 11/30/2012  Findings: The patient has undergone left hemiarthroplasty.  The proximal femoral prosthesis appears in excellent position in the AP projection.  The pelvis is otherwise normal.  IMPRESSION: Satisfactory appearance of the pelvis after the left hip hemiarthroplasty.   Original Report Authenticated By: Francene Boyers, M.D.   Dg Hip Portable 1 View Left  12/01/2012   *RADIOLOGY REPORT*  Clinical Data: Left hip fracture.  PORTABLE LEFT HIP - 1 VIEW  Comparison: Radiographs dated 11/30/2012  Findings: The  patient has undergone left hemiarthroplasty.  The proximal left femoral prosthesis appears in excellent position in the AP projection.  No fracture.  IMPRESSION: Satisfactory appearance of the left hip in the AP projection after left hemiarthroplasty.   Original Report Authenticated By: Francene Boyers, M.D.    Microbiology: Recent Results (from the past 240 hour(s))  MRSA PCR SCREENING     Status: None   Collection Time    12/01/12  6:53 AM      Result Value Range Status   MRSA by PCR NEGATIVE  NEGATIVE Final   Comment:            The GeneXpert MRSA Assay (FDA     approved for NASAL specimens     only), is one component of a     comprehensive MRSA colonization     surveillance program. It is not     intended to diagnose MRSA     infection nor to guide or     monitor treatment for     MRSA infections.     Labs: Basic Metabolic Panel:  Recent Labs Lab 11/30/12 2302 12/01/12 0520 12/02/12 0520  NA 142 143 140  K 3.9 4.1 4.4  CL 106 108 108  CO2 24 26 26   GLUCOSE 139* 166* 211*  BUN 18 16 15   CREATININE 1.26 1.17 1.36*  CALCIUM 9.5 8.7 8.4   Liver Function Tests:  Recent Labs Lab 11/30/12 2302  AST 23  ALT 16  ALKPHOS 84  BILITOT 0.7  PROT 6.4  ALBUMIN 3.7   No results found for this basename: LIPASE, AMYLASE,  in the last 168 hours No results found for this basename: AMMONIA,  in the last  168 hours CBC:  Recent Labs Lab 11/30/12 2302 12/01/12 0520 12/02/12 0520  WBC 8.6 7.5 11.2*  NEUTROABS 6.1  --   --   HGB 12.3* 11.8* 9.2*  HCT 36.1* 35.1* 28.6*  MCV 91.9 93.9 96.3  PLT 161 153 135*   Cardiac Enzymes: No results found for this basename: CKTOTAL, CKMB, CKMBINDEX, TROPONINI,  in the last 168 hours BNP: BNP (last 3 results) No results found for this basename: PROBNP,  in the last 8760 hours CBG:  Recent Labs Lab 12/02/12 0635 12/02/12 0729 12/02/12 1148 12/02/12 1730 12/02/12 2207  GLUCAP 180* 169* 224* 168* 143*       Signed:  HERNANDEZ ACOSTA,ESTELA  [facility] Pager: [phone] 12/03/2012, 11:09 AM

## 2012-12-03 NOTE — Progress Notes (Addendum)
Pt for discharge to [facility].  CSW facilitated pt discharge needs including contacting facility, faxing pt discharge information via [facility], discussing with pt and pt family at bedside, providing RN phone number to call report, and arranging ambulance transportation for pt to [facility] (Service Request ID#: [id number])  No further social work needs identified at this time.  CSW signing off.    Jacklynn Lewis, MSW, LCSWA (coverage for [employer]) Clinical Social Work [phone]

## 2012-12-03 NOTE — Progress Notes (Signed)
Patient ID: George Kemp, male   DOB: October 29, 1926, 79 y.o.   MRN: [id number] [facility] (31) Code Status: DNR  No Known Allergies  Chief Complaint  Patient presents with  . Hospitalization Follow-up    Left hip fracture    HPI: This is an 79 year old male resident of [facility], Skilled nursing section. He sustained a fall onto his left side on 11/30/2012. He had immediate left-sided pain was unable to move his leg. Patient was transferred to the Emergency Department where evaluation revealed a subcapital fracture of the proximal left femur. Orthopedic consultation was obtained, surgical repair of the fracture was recommended. This was discussed with the patient's family, all agreed to proceed with surgery. Patient underwent left hemiarthroplasty on July 5 by Dr. Jene Every. Surgical procedure and postoperative course were uneventful. Patient did develop mild tachycardia, pt was started on metoprolol. During hospitalization his diabetes was well-controlled, he had no behavioral issues related to his dementia.  PT and OT evaluations were completed.  Patient was medically ready for transfer back to Skilled nursing facility at [facility] on Monday, 12/03/2012.   DATA REVIEWED  Radiological Exams: 11/30/12: LEFT FEMUR - 2 VIEW  IMPRESSION: Osseous demineralization. Displaced subcapital fracture left femur.   PELVIS - 1-2 VIEW  IMPRESSION: Subcapital fracture of the proximal left femur with varus angulation of the fracture fragments. No displaced pelvic fractures identified.  12/01/12: CHEST - 2 VIEW   Findings: Shallow inspiration with linear atelectasis in the lung bases. No blunting of costophrenic angles. No pneumothorax. Mediastinal contours appear intact. Normal heart size and pulmonary vascularity. Degenerative changes in the spine. IMPRESSION: Shallow inspiration with atelectasis in the lung bases    PORTABLE LEFT HIP - 1 VIEW Comparison: Radiographs  dated 11/30/2012 IMPRESSION: Satisfactory appearance of the left hip in the AP projection after left hemiarthroplasty   Labs reviewed: Basic Metabolic Panel:  Recent Labs  13/08/65 2302 12/01/12 0520 12/02/12 0520  NA 142 143 140  K 3.9 4.1 4.4  CL 106 108 108  CO2 24 26 26   GLUCOSE 139* 166* 211*  BUN 18 16 15   CREATININE 1.26 1.17 1.36*  CALCIUM 9.5 8.7 8.4   Liver Function Tests:  Recent Labs  11/30/12 2302  AST 23  ALT 16  ALKPHOS 84  BILITOT 0.7  PROT 6.4  ALBUMIN 3.7   CBC:  Recent Labs  11/30/12 2302 12/01/12 0520 12/02/12 0520  WBC 8.6 7.5 11.2*  NEUTROABS 6.1  --   --   HGB 12.3* 11.8* 9.2*  HCT 36.1* 35.1* 28.6*  MCV 91.9 93.9 96.3  PLT 161 153 135*   CBG:  Recent Labs  12/02/12 2207 12/03/12 0752 12/03/12 1139  GLUCAP 143* 155* 175*    Medications: Patient's Medications  New Prescriptions   No medications on file  Previous Medications   ACETAMINOPHEN (TYLENOL) 325 MG TABLET    Take 650 mg by mouth every 6 (six) hours as needed for pain.   AMLODIPINE (NORVASC) 5 MG TABLET    Take 5 mg by mouth every morning.   ASPIRIN 81 MG EC TABLET    Take 1 tablet (81 mg total) by mouth daily. Swallow whole.   ATORVASTATIN (LIPITOR) 20 MG TABLET    Take 20 mg by mouth every morning.   CHOLECALCIFEROL (VITAMIN D) 1000 UNITS TABLET    Take 1,000 Units by mouth every morning.   CLOMIPRAMINE (ANAFRANIL) 25 MG CAPSULE    Take 25 mg by mouth every morning.  CLOPIDOGREL (PLAVIX) 75 MG TABLET    Take 75 mg by mouth daily.   DOCUSATE SODIUM 100 MG CAPS    Take 100 mg by mouth 2 (two) times daily.   DONEPEZIL (ARICEPT) 10 MG TABLET    Take 10 mg by mouth daily.   HYDROCODONE-ACETAMINOPHEN (NORCO) 5-325 MG PER TABLET    Take 1-2 tablets by mouth every 6 (six) hours as needed for pain.   LATANOPROST (XALATAN) 0.005 % OPHTHALMIC SOLUTION    Place 1 drop into both eyes at bedtime.   LORATADINE (CLARITIN) 10 MG TABLET    Take 10 mg by mouth daily as needed for  allergies.   METOPROLOL TARTRATE (LOPRESSOR) 25 MG TABLET    Take 1 tablet (25 mg total) by mouth 2 (two) times daily.   MULTIPLE VITAMIN (MULTIVITAMIN WITH MINERALS) TABS    Take 1 tablet by mouth daily.   POLYETHYLENE GLYCOL (MIRALAX / GLYCOLAX) PACKET    Take 17 g by mouth every other day.   QUETIAPINE (SEROQUEL) 25 MG TABLET    Take 25 mg by mouth at bedtime.   TAMSULOSIN (FLOMAX) 0.4 MG CAPS    Take 0.4 mg by mouth daily after breakfast.  Modified Medications   No medications on file  Discontinued Medications   No medications on file     Past Medical History  Diagnosis Date  . Stroke 08/2009    Left side with residual deficit  . Hypertension   . Bipolar 1 disorder   . Dementia   . Diabetes    No past surgical history on file. Social History:   reports that he has never smoked. He does not have any smokeless tobacco history on file. He reports that he does not drink alcohol or use illicit drugs.   Family History  Problem Relation Age of Onset  . Stroke Mother   . Stroke Brother      X 2  . Congestive Heart Failure Mother   . Diabetes Brother   . Cancer - Other Father     Unknown  Type   Review of Systems:   DATA OBTAINED: from patient, medical record GENERAL: Feels well   No fevers, fatigue, change in appetite or weight SKIN: No itch, rash or open wounds EYES: No eye pain, dryness or itching    EARS: No earache, tinnitus, change in hearing NOSE: No congestion, drainage or bleeding MOUTH/THROAT: No mouth or tooth pain  No sore throat No difficulty chewing or swallowing RESPIRATORY: No cough, wheezing, SOB CARDIAC: No chest pain, palpitations  No edema. GI: No abdominal pain  No N/V/D or constipation  No heartburn or reflux No BM during hospitlaization GU: No dysuria, frequency or urgency Condom catheter  MUSCULOSKELETAL: Left leg discomfort with movement    No back pain   NEUROLOGIC: No dizziness, fainting, headache   Increased confusion  PSYCHIATRIC: No feelings  of anxiety, depression Sleeps well.  No behavior issue.    Physical Exam: Filed Vitals:   12/03/12 1747  BP: 140/84  Pulse: 89  Temp: 96.9 F (36.1 C)  Resp: 22   GENERAL APPEARANCE: No acute distress, appropriately groomed, Frail body habitus. Alert, pleasant, conversant. SKIN: No diaphoresis, rash, unusual lesions HEAD: Normocephalic, atraumatic EYES: Conjunctiva/lids clear. Pupils round, reactive.   EARS: External exam WNL, decreased Hearing . NOSE: No deformity or discharge. MOUTH/THROAT: Lips w/o lesions. Oral mucosa, tongue moist, w/o lesion. Oropharynx w/o redness or lesions.  NECK: Supple, full ROM. No thyroid tenderness, enlargement or nodule  LYMPHATICS: No head, neck or supraclavicular adenopathy RESPIRATORY: Breathing is even, unlabored. Lung sounds are clear and full.  CARDIOVASCULAR: Heart RRR. No murmur or extra heart sounds mild tachycardia  EDEMA: No peripheral  edema.  GASTROINTESTINAL: Abdomen is soft, non-tender, Mildly distended w/ normal bowel sounds. No hepatic or splenic enlargement. No mass, ventral or inguinal hernia. GENITOURINARY: Bladder non tender, not distended.  MALE: No penile lesion or drainage. Condom catheter in place draining clear urine MUSCULOSKELETAL: Moves Rt UE with full ROM, strength and tone. Left UE paresis (previous CVA) Back is without kyphosis, scoliosis or spinal process tenderness.  Left lower extremity in knee immobilizer left hip incision covered with surgical dressing no surrounding erythema edema or ecchymosis. Patella with evidence of early pressure injury. NEUROLOGIC: Not Oriented to time, place, Oriented to person. Speech clear, no tremor.   PSYCHIATRIC: Mild agitation, wants to get OOB, responds to direct instruction  Assessment/Plan  Closed left hip fracture Status post left hemiarthroplasty 12/01/2012. Incision not examined today surgical dressing still in place. Knee immobilizer on leg to prevent leg crossing and external  rotation, there is signs of early pressure injury on the left patella. Will pad to protect this area. Patient will be scheduled for followup with Dr. Jillyn Hidden in 2 weeks.  Diabetes Stable during hospitalization, diet controlled. Continue CBG monitoring  Dementia Increased confusion/ agitation with injury, surgery/ hospitalization. Close monitoring  Including bed alarm for safety. Anticipate he will return to baseline as he is reacquainted with surroundings   Family/ staff Communication: Discussed present condition, plan with spouse, son in law.    Goals of care: Return to previous level of functioning if possible   Labs/tests ordered: CBC, BMP 12/04/12   Yanci Bachtell T.Aryannah Mohon, NP-C 12/03/2012

## 2012-12-03 NOTE — Progress Notes (Signed)
Subjective: 2 Days Post-Op Procedure(s) (LRB): ARTHROPLASTY BIPOLAR HIP (Left) Patient reports pain as mild.    Objective: Vital signs in last 24 hours: Temp:  [98.5 F (36.9 C)-100.1 F (37.8 C)] 98.5 F (36.9 C) (07/07 0611) Pulse Rate:  [54-129] 120 (07/07 0611) Resp:  [16-18] 16 (07/07 0611) BP: (93-127)/(41-62) 113/59 mmHg (07/07 0611) SpO2:  [90 %-96 %] 96 % (07/07 0611)  Intake/Output from previous day: 07/06 0701 - 07/07 0700 In: 720 [P.O.:720] Out: 450 [Urine:450] Intake/Output this shift:     Recent Labs  11/30/12 2302 12/01/12 0520 12/02/12 0520  HGB 12.3* 11.8* 9.2*    Recent Labs  12/01/12 0520 12/02/12 0520  WBC 7.5 11.2*  RBC 3.74* 2.97*  HCT 35.1* 28.6*  PLT 153 135*    Recent Labs  12/01/12 0520 12/02/12 0520  NA 143 140  K 4.1 4.4  CL 108 108  CO2 26 26  BUN 16 15  CREATININE 1.17 1.36*  GLUCOSE 166* 211*  CALCIUM 8.7 8.4   No results found for this basename: LABPT, INR,  in the last 72 hours  Neurologically intact ABD soft Neurovascular intact Sensation intact distally Intact pulses distally Dorsiflexion/Plantar flexion intact Incision: dressing C/D/I and no drainage No cellulitis present Compartment soft No calf pain or sign of DVT  Assessment/Plan: 2 Days Post-Op Procedure(s) (LRB): ARTHROPLASTY BIPOLAR HIP (Left) Advance diet Up with therapy D/C IV fluids ASA 81 mg and Plavix for DVT ppx Placement  George Kemp M. 12/03/2012, 7:38 AM

## 2012-12-03 NOTE — Progress Notes (Signed)
OT Cancellation Note  Patient Details Name: [name] MRN: [id number] DOB: December 30, 1926   Cancelled Treatment:    Reason Eval/Treat Not Completed: Patient's level of consciousness;Other (comment) Pt difficult to arouse this am and attempting to pull condom catheter out.   Pt from SNF and returning to SNF for rehab will defer OT eval and treatment to them.  Kimbely Whiteaker OTR/L Pager number [phone] 12/03/2012, 8:25 AM

## 2012-12-04 ENCOUNTER — Encounter (HOSPITAL_COMMUNITY): Payer: Self-pay | Admitting: Specialist

## 2012-12-06 ENCOUNTER — Encounter: Payer: Self-pay | Admitting: Geriatric Medicine

## 2012-12-06 ENCOUNTER — Observation Stay (HOSPITAL_COMMUNITY)
Admission: AD | Admit: 2012-12-06 | Discharge: 2012-12-07 | Disposition: A | Discharge status: Skilled Nursing Facility | Payer: Medicare Other | Source: Ambulatory Visit | Attending: Internal Medicine | Admitting: Internal Medicine

## 2012-12-06 ENCOUNTER — Non-Acute Institutional Stay (SKILLED_NURSING_FACILITY): Payer: Medicare Other | Admitting: Geriatric Medicine

## 2012-12-06 DIAGNOSIS — I6990 Unspecified sequelae of unspecified cerebrovascular disease: Secondary | ICD-10-CM | POA: Insufficient documentation

## 2012-12-06 DIAGNOSIS — I69959 Hemiplegia and hemiparesis following unspecified cerebrovascular disease affecting unspecified side: Secondary | ICD-10-CM | POA: Insufficient documentation

## 2012-12-06 DIAGNOSIS — S72002A Fracture of unspecified part of neck of left femur, initial encounter for closed fracture: Secondary | ICD-10-CM

## 2012-12-06 DIAGNOSIS — L8991 Pressure ulcer of unspecified site, stage 1: Secondary | ICD-10-CM

## 2012-12-06 DIAGNOSIS — L8990 Pressure ulcer of unspecified site, unspecified stage: Secondary | ICD-10-CM | POA: Insufficient documentation

## 2012-12-06 DIAGNOSIS — F319 Bipolar disorder, unspecified: Secondary | ICD-10-CM | POA: Insufficient documentation

## 2012-12-06 DIAGNOSIS — F0390 Unspecified dementia without behavioral disturbance: Secondary | ICD-10-CM

## 2012-12-06 DIAGNOSIS — N183 Chronic kidney disease, stage 3 unspecified: Secondary | ICD-10-CM | POA: Diagnosis present

## 2012-12-06 DIAGNOSIS — L89891 Pressure ulcer of other site, stage 1: Secondary | ICD-10-CM

## 2012-12-06 DIAGNOSIS — F0150 Vascular dementia without behavioral disturbance: Secondary | ICD-10-CM | POA: Diagnosis present

## 2012-12-06 DIAGNOSIS — Z79899 Other long term (current) drug therapy: Secondary | ICD-10-CM | POA: Insufficient documentation

## 2012-12-06 DIAGNOSIS — L89899 Pressure ulcer of other site, unspecified stage: Secondary | ICD-10-CM | POA: Diagnosis present

## 2012-12-06 DIAGNOSIS — D62 Acute posthemorrhagic anemia: Principal | ICD-10-CM | POA: Diagnosis present

## 2012-12-06 DIAGNOSIS — S72002D Fracture of unspecified part of neck of left femur, subsequent encounter for closed fracture with routine healing: Secondary | ICD-10-CM

## 2012-12-06 DIAGNOSIS — E1122 Type 2 diabetes mellitus with diabetic chronic kidney disease: Secondary | ICD-10-CM | POA: Diagnosis present

## 2012-12-06 DIAGNOSIS — S72009A Fracture of unspecified part of neck of unspecified femur, initial encounter for closed fracture: Secondary | ICD-10-CM | POA: Insufficient documentation

## 2012-12-06 DIAGNOSIS — C44219 Basal cell carcinoma of skin of left ear and external auricular canal: Secondary | ICD-10-CM | POA: Insufficient documentation

## 2012-12-06 DIAGNOSIS — I10 Essential (primary) hypertension: Secondary | ICD-10-CM | POA: Diagnosis present

## 2012-12-06 DIAGNOSIS — E119 Type 2 diabetes mellitus without complications: Secondary | ICD-10-CM

## 2012-12-06 DIAGNOSIS — Z96649 Presence of unspecified artificial hip joint: Secondary | ICD-10-CM | POA: Insufficient documentation

## 2012-12-06 DIAGNOSIS — K5900 Constipation, unspecified: Secondary | ICD-10-CM | POA: Diagnosis present

## 2012-12-06 DIAGNOSIS — S72009D Fracture of unspecified part of neck of unspecified femur, subsequent encounter for closed fracture with routine healing: Secondary | ICD-10-CM

## 2012-12-06 DIAGNOSIS — X58XXXA Exposure to other specified factors, initial encounter: Secondary | ICD-10-CM | POA: Insufficient documentation

## 2012-12-06 LAB — GLUCOSE, CAPILLARY

## 2012-12-06 LAB — PREPARE RBC (CROSSMATCH)

## 2012-12-06 LAB — BASIC METABOLIC PANEL
BUN: 28 mg/dL — ABNORMAL HIGH (ref 6–23)
CO2: 30 mEq/L (ref 19–32)
Calcium: 8.6 mg/dL (ref 8.4–10.5)
GFR calc non Af Amer: 51 mL/min — ABNORMAL LOW (ref >90)
Glucose, Bld: 150 mg/dL — ABNORMAL HIGH (ref 70–99)
Potassium: 3.8 mEq/L (ref 3.5–5.1)

## 2012-12-06 LAB — CBC WITH DIFFERENTIAL/PLATELET
Eosinophils Absolute: 0.3 10*3/uL (ref 0.0–0.7)
Eosinophils Relative: 4 % (ref 0–5)
Hemoglobin: 7.7 g/dL — ABNORMAL LOW (ref 13.0–17.0)
Lymphs Abs: 1.3 10*3/uL (ref 0.7–4.0)
MCH: 31.3 pg (ref 26.0–34.0)
MCV: 95.1 fL (ref 78.0–100.0)
Monocytes Relative: 11 % (ref 3–12)
RBC: 2.46 MIL/uL — ABNORMAL LOW (ref 4.22–5.81)

## 2012-12-06 MED ORDER — CLOMIPRAMINE HCL 25 MG PO CAPS
25.0000 mg | ORAL_CAPSULE | Freq: Every morning | ORAL | Status: DC
  Administered 2012-12-07: 25 mg via ORAL
  Filled 2012-12-06: qty 1

## 2012-12-06 MED ORDER — ATORVASTATIN CALCIUM 20 MG PO TABS
20.0000 mg | ORAL_TABLET | Freq: Every morning | ORAL | Status: DC
  Administered 2012-12-07: 20 mg via ORAL
  Filled 2012-12-06: qty 1

## 2012-12-06 MED ORDER — DONEPEZIL HCL 10 MG PO TABS
10.0000 mg | ORAL_TABLET | Freq: Every day | ORAL | Status: DC
  Administered 2012-12-06 – 2012-12-07 (×2): 10 mg via ORAL
  Filled 2012-12-06 (×2): qty 1

## 2012-12-06 MED ORDER — SENNA 8.6 MG PO TABS
2.0000 | ORAL_TABLET | Freq: Every day | ORAL | Status: DC
  Administered 2012-12-06: 17.2 mg via ORAL
  Filled 2012-12-06: qty 1

## 2012-12-06 MED ORDER — QUETIAPINE FUMARATE 25 MG PO TABS
25.0000 mg | ORAL_TABLET | Freq: Every day | ORAL | Status: DC
  Administered 2012-12-06: 25 mg via ORAL
  Filled 2012-12-06 (×2): qty 1

## 2012-12-06 MED ORDER — ADULT MULTIVITAMIN W/MINERALS CH
1.0000 | ORAL_TABLET | Freq: Every day | ORAL | Status: DC
  Administered 2012-12-06 – 2012-12-07 (×2): 1 via ORAL
  Filled 2012-12-06 (×2): qty 1

## 2012-12-06 MED ORDER — ENOXAPARIN SODIUM 40 MG/0.4ML ~~LOC~~ SOLN
40.0000 mg | Freq: Every day | SUBCUTANEOUS | Status: DC
  Administered 2012-12-06: 40 mg via SUBCUTANEOUS
  Filled 2012-12-06 (×2): qty 0.4

## 2012-12-06 MED ORDER — FLEET ENEMA 7-19 GM/118ML RE ENEM: 1.0000 | ENEMA | Freq: Every day | RECTAL | Status: DC | PRN

## 2012-12-06 MED ORDER — LATANOPROST 0.005 % OP SOLN
1.0000 [drp] | Freq: Every day | OPHTHALMIC | Status: DC
  Filled 2012-12-06 (×2): qty 2.5

## 2012-12-06 MED ORDER — TAMSULOSIN HCL 0.4 MG PO CAPS
0.4000 mg | ORAL_CAPSULE | Freq: Every day | ORAL | Status: DC
  Administered 2012-12-07: 0.4 mg via ORAL
  Filled 2012-12-06 (×2): qty 1

## 2012-12-06 MED ORDER — AMLODIPINE BESYLATE 5 MG PO TABS
5.0000 mg | ORAL_TABLET | Freq: Every morning | ORAL | Status: DC
  Administered 2012-12-07: 5 mg via ORAL
  Filled 2012-12-06: qty 1

## 2012-12-06 MED ORDER — POLYETHYLENE GLYCOL 3350 17 G PO PACK
17.0000 g | PACK | Freq: Two times a day (BID) | ORAL | Status: DC
  Administered 2012-12-06 – 2012-12-07 (×2): 17 g via ORAL
  Filled 2012-12-06 (×3): qty 1

## 2012-12-06 MED ORDER — BISACODYL 10 MG RE SUPP: 10.0000 mg | Freq: Every day | RECTAL | Status: DC

## 2012-12-06 MED ORDER — METOPROLOL TARTRATE 25 MG PO TABS
25.0000 mg | ORAL_TABLET | Freq: Two times a day (BID) | ORAL | Status: DC
  Administered 2012-12-06 – 2012-12-07 (×2): 25 mg via ORAL
  Filled 2012-12-06 (×3): qty 1

## 2012-12-06 MED ORDER — HYDROCODONE-ACETAMINOPHEN 5-325 MG PO TABS
1.0000 | ORAL_TABLET | Freq: Four times a day (QID) | ORAL | Status: DC | PRN
  Administered 2012-12-06 – 2012-12-07 (×2): 1 via ORAL
  Filled 2012-12-06 (×2): qty 1

## 2012-12-06 MED ORDER — LORATADINE 10 MG PO TABS
10.0000 mg | ORAL_TABLET | Freq: Every day | ORAL | Status: DC | PRN
  Filled 2012-12-06: qty 1

## 2012-12-06 MED ORDER — ACETAMINOPHEN 325 MG PO TABS: 650.0000 mg | ORAL_TABLET | Freq: Four times a day (QID) | ORAL | Status: DC | PRN

## 2012-12-06 MED ORDER — VITAMIN D3 25 MCG (1000 UNIT) PO TABS
1000.0000 [IU] | ORAL_TABLET | Freq: Every morning | ORAL | Status: DC
  Administered 2012-12-07: 1000 [IU] via ORAL
  Filled 2012-12-06: qty 1

## 2012-12-06 NOTE — Assessment & Plan Note (Signed)
Stable during hospitalization, diet controlled. Continue CBG monitoring

## 2012-12-06 NOTE — Progress Notes (Signed)
Patient ID: George Kemp, male   DOB: Mar 03, 1927, 79 y.o.   MRN: [id number] [facility] [phone]  Chief Complaint  Patient presents with  . Anemia    HPI: This is a 79 y.o. male resident of [facility] Sk[facility] section.  Evaluation is requested today due to worsening anemia. This patient's hemoglobin has dropped from 9.2 at hospital discharge to 7.0 today. It has been mildly tachycardic with a heart rate today and 102. His blood pressure has remained stable. Has required supplemental oxygen since returning to [facility] due to mildly reduced oxygen saturations. Patient is not exhibiting any significant change in his pain level. He has been constipated with minimal bowel movement since return from the hospital. Urine output urine output has been adequate.    No Known Allergies Medications  Current Outpatient Prescriptions on File Prior to Visit  Medication Sig Dispense Refill  . amLODipine (NORVASC) 5 MG tablet Take 5 mg by mouth every morning.      Marland Kitchen atorvastatin (LIPITOR) 20 MG tablet Take 20 mg by mouth every morning.      . cholecalciferol (VITAMIN D) 1000 UNITS tablet Take 1,000 Units by mouth every morning.      . clomiPRAMINE (ANAFRANIL) 25 MG capsule Take 25 mg by mouth every morning.      . docusate sodium 100 MG CAPS Take 100 mg by mouth 2 (two) times daily.  10 capsule  0  . donepezil (ARICEPT) 10 MG tablet Take 10 mg by mouth daily.      Marland Kitchen HYDROcodone-acetaminophen (NORCO) 5-325 MG per tablet Take 1-2 tablets by mouth every 6 (six) hours as needed for pain.  40 tablet  0  . latanoprost (XALATAN) 0.005 % ophthalmic solution Place 1 drop into both eyes at bedtime.      Marland Kitchen loratadine (CLARITIN) 10 MG tablet Take 10 mg by mouth daily as needed for allergies.      . metoprolol tartrate (LOPRESSOR) 25 MG tablet Take 1 tablet (25 mg total) by mouth 2 (two) times daily.      . Multiple Vitamin (MULTIVITAMIN WITH MINERALS) TABS Take 1 tablet by  mouth daily.      . polyethylene glycol (MIRALAX / GLYCOLAX) packet Take 17 g by mouth every other day.      Marland Kitchen QUEtiapine (SEROQUEL) 25 MG tablet Take 25 mg by mouth at bedtime.      . tamsulosin (FLOMAX) 0.4 MG CAPS Take 0.4 mg by mouth daily after breakfast.      . acetaminophen (TYLENOL) 325 MG tablet Take 650 mg by mouth every 6 (six) hours as needed for pain.       No current facility-administered medications on file prior to visit.    DATA REVIEWED  Laboratory Studies: [facility], external  12/04/2012 WBC 7.0, hemoglobin 7.6, hematocrit 22.5, platelet 118  Glucose 145, BUN 28, creatinine 1.38, sodium 144, potassium 4.3  12/06/2012: WBC 6.7, hemoglobin 7.0, hematocrit 20.7, platelets 179  CBC NO Diff (Complete Blood Count) Result: 12/06/2012 10:26 AM Status: F WBC 6.7  RBC 2.32  Hemoglobin 7.0 Hematocrit 20.7 L MCV 89.2 78.0-100.0 fL SLN MCH 30.2 26.0-34.0 pg SLN MCHC 33.8 30.0-36.0 g/dL SLN RDW 52.8 41.3-24.4 % SLN Platelet Count 179 150-400 K/uL SLN      Review of Systems  DATA OBTAINED: from patient, nurse, medical record, GENERAL: Feels "OK"   No fevers, change in appetite or weight. Eating approximately 50% of meals, tricking adequately SKIN: No itch, rash or open wounds  EYES: No eye pain, dryness or itching  EARS: No earache, tinnitus, change in hearing NOSE: No congestion, drainage or bleeding MOUTH/THROAT: No mouth or tooth pain  No sore throat No difficulty chewing or swallowing RESPIRATORY: No cough, wheezing, SOB  CARDIAC: No chest pain, palpitations  No edema. GI: No abdominal pain  No N/V/D Constipation  No heartburn or reflux   GU: No dysuria, frequency or urgency  No change in urine volume or character No nocturia or change in stream   MUSCULOSKELETAL: Surgical left hip pain  No back pain  No muscle ache, pain, weakness   NEUROLOGIC: No dizziness, fainting, headache  No change in mental status (dementia).  PSYCHIATRIC: No feelings of anxiety, depression  Sleeps well.  No behavior issue.    Physical Exam Filed Vitals:   12/06/12 1104  BP: 130/70  Pulse: 106  Temp: 98.7 F (37.1 C)  Resp: 18  SpO2: 96%    GENERAL APPEARANCE: No acute distress, appropriately groomed, normal body habitus. Alert, pleasant, conversant. SKIN: No diaphoresis rash, unusual lesions. Resolving ecchymotic area left posterior ribs, related to fall 11/30/2012. Left patella with stage I pressure injury. HEAD: Normocephalic, atraumatic EYES: Conjunctiva/lids clear. Pupils round, reactive.  Conjunctiva very pale EARS: Decreased Hearing  NOSE: No deformity or discharge. MOUTH/THROAT: Lips w/o lesions. Oral mucosa, tongue moist, w/o lesion. Oropharynx w/o redness or lesions.  NECK: Supple, full ROM. No thyroid tenderness, enlargement or nodule LYMPHATICS: No head, neck or supraclavicular adenopathy RESPIRATORY: Breathing is even, unlabored. Lung crackles at bilateral bases CARDIOVASCULAR: Heart RRR. No murmur or extra heart sounds mild tachycardia  EDEMA: No peripheral or periorbital edema. GASTROINTESTINAL: Abdomen is soft, non-tender, mildly distended w/ normal bowel sounds. No CVA tenderness GENITOURINARY: Bladder non tender, not distended.  MALE: No penile lesion or drainage. No scrotal edema MUSCULOSKELETAL: Moves UE with full ROM, strength and tone. Back is without kyphosis, scoliosis or spinal process tenderness.  Left hip incision covered with surgical dressing, no surrounding erythema, edema or ecchymosis. Left leg with mild edema.  PSYCHIATRIC: Mood and affect appropriate to situation  ASSESSMENT/PLAN  Postoperative anemia due to acute blood loss This patient with significant drop in hemoglobin and hematocrit since hospital discharge. Stool is guaiac negative. Is mildly symptomatic from this anemia, with advanced age he is at increased risk for cardiac complications. Recommend transfusion, family is in agreement.  Outpatient transfusion is not available for  the next week. Have arranged for Direct admission with [facility] today.   Pressure ulcer of left knee Entire patella with red blanchable redness, small area lateral patella edge of dry skin irritation, no open wound. Similar area medial knee.    Irys Nigh T.Kharson Rasmusson, NP-C 12/06/2012

## 2012-12-06 NOTE — Assessment & Plan Note (Addendum)
This patient with significant drop in hemoglobin and hematocrit since hospital discharge. Stool is guaiac negative. Is mildly symptomatic from this anemia, with advanced age he is at increased risk for cardiac complications. Recommend transfusion, family is in agreement.  Outpatient transfusion is not available for the next week. Have arranged for Direct admission with [facility] today.

## 2012-12-06 NOTE — Assessment & Plan Note (Signed)
Increased confusion/ agitation with injury, surgery/ hospitalization. Close monitoring  Including bed alarm for safety. Anticipate he will return to baseline as he is reacquainted with surroundings

## 2012-12-06 NOTE — Assessment & Plan Note (Signed)
Entire patella with red blanchable redness, small area lateral patella edge of dry skin irritation, no open wound. Similar area medial knee.

## 2012-12-06 NOTE — Assessment & Plan Note (Signed)
Status post left hemiarthroplasty 12/01/2012. Incision not examined today surgical dressing still in place. Knee immobilizer on leg to prevent leg crossing and external rotation, there is signs of early pressure injury on the left patella. Will pad to protect this area. Patient will be scheduled for followup with Dr. Jillyn Hidden in 2 weeks.

## 2012-12-06 NOTE — H&P (Addendum)
[facility] History and Physical  Demarius Archila ZOX:[id number] DOB: May 26, 1927 DOA: 12/06/2012  Referring physician: [facility] PCP: Kimber Relic, MD   Chief Complaint: anemia  HPI: George Kemp is a 79 y.o. male with a history of dementia, previous stroke and recent left hip fracture and repair. Directly admitted for blood transfusion. Hemoglobin was found to be 7.0 at skilled nursing facility. No bleeding noted. No shortness of breath, chest pain. Patient has been very constipated since the fracture. Last bowel movement was 3 days ago despite aggressive bowel regimen.  Review of Systems: Systems reviewed and as above otherwise negative.  Past Medical History  Diagnosis Date  . Stroke 08/2009    Left side with residual deficit  . Hypertension   . Bipolar 1 disorder   . Dementia   . Diabetes    Past Surgical History  Procedure Laterality Date  . Hip arthroplasty Left 12/01/2012    Procedure: ARTHROPLASTY BIPOLAR HIP;  Surgeon: Javier Docker, MD;  Location: [facility];  Service: Orthopedics;  Laterality: Left;   Social History: Resident of [facility] skilled [facility] facility. Patient has been getting lifted into a chair. Even prior to the fracture, was nonambulatory do to left hemiparesthesias from previous stroke. Here with wife, daughter and son-in-law. Out of facility DO NOT RESUSCITATE paperwork accompanies the patient.  No Known Allergies  Family History  Problem Relation Age of Onset  . Stroke Mother   . Stroke Brother      X 2  . Congestive Heart Failure Mother   . Diabetes Brother   . Cancer - Other Father     Unknown  Type   Prior to Admission medications   Medication Sig Start Date End Date Taking? Authorizing Provider  HYDROcodone-acetaminophen (NORCO) 5-325 MG per tablet Take 1-2 tablets by mouth every 6 (six) hours as needed for pain. 12/03/12  Yes Estela Isaiah Blakes, MD  acetaminophen (TYLENOL) 325 MG tablet Take 650 mg by mouth every 6 (six)  hours as needed for pain.    Historical Provider, MD  amLODipine (NORVASC) 5 MG tablet Take 5 mg by mouth every morning.    Historical Provider, MD  atorvastatin (LIPITOR) 20 MG tablet Take 20 mg by mouth every morning.    Historical Provider, MD  cholecalciferol (VITAMIN D) 1000 UNITS tablet Take 1,000 Units by mouth every morning.    Historical Provider, MD  clomiPRAMINE (ANAFRANIL) 25 MG capsule Take 25 mg by mouth every morning.    Historical Provider, MD  docusate sodium 100 MG CAPS Take 100 mg by mouth 2 (two) times daily. 12/03/12   Henderson Cloud, MD  donepezil (ARICEPT) 10 MG tablet Take 10 mg by mouth daily.    Historical Provider, MD  latanoprost (XALATAN) 0.005 % ophthalmic solution Place 1 drop into both eyes at bedtime.    Historical Provider, MD  loratadine (CLARITIN) 10 MG tablet Take 10 mg by mouth daily as needed for allergies.    Historical Provider, MD  metoprolol tartrate (LOPRESSOR) 25 MG tablet Take 1 tablet (25 mg total) by mouth 2 (two) times daily. 12/03/12   Henderson Cloud, MD  Multiple Vitamin (MULTIVITAMIN WITH MINERALS) TABS Take 1 tablet by mouth daily.    Historical Provider, MD  polyethylene glycol (MIRALAX / GLYCOLAX) packet Take 17 g by mouth every other day.    Historical Provider, MD  QUEtiapine (SEROQUEL) 25 MG tablet Take 25 mg by mouth at bedtime.    Historical Provider,  MD  tamsulosin (FLOMAX) 0.4 MG CAPS Take 0.4 mg by mouth daily after breakfast.    Historical Provider, MD   Physical Exam: Filed Vitals:   12/06/12 1754  BP: 106/56  Pulse: 88  Temp: 98.5 F (36.9 C)  TempSrc: Oral  Resp: 18  Height: 5\' 11"  (1.803 m)  Weight: 75.4 kg (166 lb 3.6 oz)  SpO2: 93%    BP 106/56  Pulse 88  Temp(Src) 98.5 F (36.9 C) (Oral)  Resp 18  Ht 5\' 11"  (1.803 m)  Wt 75.4 kg (166 lb 3.6 oz)  BMI 23.19 kg/m2  SpO2 93%  General Appearance:    asleep. Arousable. Pale. Answers questions with one-word answers   Head:    Normocephalic,  without obvious abnormality, atraumatic  Eyes:    PERRL,  pale conjunctiva      Ears:    Normal TM's and external ear canals, both ears  Nose:   Nares normal, septum midline, mucosa normal, no drainage   or sinus tenderness  Throat:   dry mucous membranes   Neck:   Supple, symmetrical, trachea midline, no adenopathy;       thyroid:  No enlargement/tenderness/nodules; no carotid   bruit or JVD  Back:     Symmetric, no curvature, ROM normal, no CVA tenderness  Lungs:     Clear to auscultation bilaterally, respirations unlabored  Chest wall:    No tenderness or deformity  Heart:    Regular rate and rhythm, S1 and S2 normal, no murmur, rub   or gallop  Abdomen:     Soft, non-tender, bowel sounds active all four quadrants,    no masses, no organomegaly  Genitalia:    Normal male without lesion, discharge or tenderness  Rectal:    Normal tone, normal prostate, no masses or tenderness;   guaiac negative stool  Extremities:   left leg with a brace and Ace wrap. Mild erythema noted over the patella without warmth or tenderness.  Right leg without edema.   Pulses:   2+ and symmetric all extremities  Skin:   Skin color, texture, turgor normal, no rashes or lesions  Lymph nodes:   Cervical, supraclavicular, and axillary nodes normal  Neurologic:   CNII-XII intact. Normal strength, sensation and reflexes      throughout    Psychiatric: Calm and cooperative  Labs on Admission:  Basic Metabolic Panel:  Recent Labs Lab 11/30/12 2302 12/01/12 0520 12/02/12 0520  NA 142 143 140  K 3.9 4.1 4.4  CL 106 108 108  CO2 24 26 26   GLUCOSE 139* 166* 211*  BUN 18 16 15   CREATININE 1.26 1.17 1.36*  CALCIUM 9.5 8.7 8.4   Liver Function Tests:  Recent Labs Lab 11/30/12 2302  AST 23  ALT 16  ALKPHOS 84  BILITOT 0.7  PROT 6.4  ALBUMIN 3.7   No results found for this basename: LIPASE, AMYLASE,  in the last 168 hours No results found for this basename: AMMONIA,  in the last 168  hours CBC:  Recent Labs Lab 11/30/12 2302 12/01/12 0520 12/02/12 0520  WBC 8.6 7.5 11.2*  NEUTROABS 6.1  --   --   HGB 12.3* 11.8* 9.2*  HCT 36.1* 35.1* 28.6*  MCV 91.9 93.9 96.3  PLT 161 153 135*   Cardiac Enzymes: No results found for this basename: CKTOTAL, CKMB, CKMBINDEX, TROPONINI,  in the last 168 hours  BNP (last 3 results) No results found for this basename: PROBNP,  in the last 8760  hours CBG:  Recent Labs Lab 12/02/12 1148 12/02/12 1730 12/02/12 2207 12/03/12 0752 12/03/12 1139  GLUCAP 224* 168* 143* 155* 175*   Assessment/Plan    Postoperative anemia due to acute blood loss: Repeat hemoglobin hemoglobin here 7.7. Will transfuse 1 unit PRBC and repeat Hbg post transfusion. No evidence of ongoing bleeding. Back To skilled nursing facility tomorrow if stable Active Problems:   Closed left hip fracture: Continue physical therapy. Will give Lovenox while here. Has an appointment with Dr. Shelle Iron in the near future   HTN (hypertension)   Diabetes, diet controlled   Dementia   Pressure ulcer of left knee, stage I: Continue foam dressing   History of stroke with left hemiparesis. Patient had previously been on aspirin and Plavix. I do not see this listed on his admission medications. Will need to be clarified.   Code Status: DNR Family Communication: wife, daughter, son-in-law at the bedside Disposition Plan: SNF tomorrow  Time spent: 50 minutes  Christiane Ha [facility] Pager [phone]  If 7PM-7AM, please contact night-coverage [email or web address] Password [facility] 12/06/2012, 7:18 PM

## 2012-12-07 ENCOUNTER — Encounter (HOSPITAL_COMMUNITY): Payer: Self-pay | Admitting: *Deleted

## 2012-12-07 ENCOUNTER — Encounter: Payer: Self-pay | Admitting: Geriatric Medicine

## 2012-12-07 ENCOUNTER — Non-Acute Institutional Stay (SKILLED_NURSING_FACILITY): Payer: Medicare Other | Admitting: Geriatric Medicine

## 2012-12-07 DIAGNOSIS — B369 Superficial mycosis, unspecified: Secondary | ICD-10-CM | POA: Insufficient documentation

## 2012-12-07 DIAGNOSIS — L89891 Pressure ulcer of other site, stage 1: Secondary | ICD-10-CM

## 2012-12-07 DIAGNOSIS — K5900 Constipation, unspecified: Secondary | ICD-10-CM

## 2012-12-07 DIAGNOSIS — L8991 Pressure ulcer of unspecified site, stage 1: Secondary | ICD-10-CM

## 2012-12-07 DIAGNOSIS — E119 Type 2 diabetes mellitus without complications: Secondary | ICD-10-CM

## 2012-12-07 DIAGNOSIS — L89109 Pressure ulcer of unspecified part of back, unspecified stage: Secondary | ICD-10-CM

## 2012-12-07 DIAGNOSIS — L89899 Pressure ulcer of other site, unspecified stage: Secondary | ICD-10-CM

## 2012-12-07 DIAGNOSIS — S72002D Fracture of unspecified part of neck of left femur, subsequent encounter for closed fracture with routine healing: Secondary | ICD-10-CM

## 2012-12-07 DIAGNOSIS — S72009D Fracture of unspecified part of neck of unspecified femur, subsequent encounter for closed fracture with routine healing: Secondary | ICD-10-CM

## 2012-12-07 DIAGNOSIS — L8992 Pressure ulcer of unspecified site, stage 2: Secondary | ICD-10-CM

## 2012-12-07 DIAGNOSIS — L89159 Pressure ulcer of sacral region, unspecified stage: Secondary | ICD-10-CM

## 2012-12-07 DIAGNOSIS — L89152 Pressure ulcer of sacral region, stage 2: Secondary | ICD-10-CM

## 2012-12-07 DIAGNOSIS — I10 Essential (primary) hypertension: Secondary | ICD-10-CM

## 2012-12-07 DIAGNOSIS — D62 Acute posthemorrhagic anemia: Secondary | ICD-10-CM

## 2012-12-07 HISTORY — DX: Pressure ulcer of sacral region, unspecified stage: L89.159

## 2012-12-07 LAB — HEMOGLOBIN AND HEMATOCRIT, BLOOD: Hemoglobin: 8.7 g/dL — ABNORMAL LOW (ref 13.0–17.0)

## 2012-12-07 LAB — TYPE AND SCREEN: Unit division: 0

## 2012-12-07 MED ORDER — SENNA 8.6 MG PO TABS: 2.0000 | ORAL_TABLET | Freq: Every day | ORAL | Status: DC

## 2012-12-07 MED ORDER — FERROUS SULFATE 325 (65 FE) MG PO TABS: 325.0000 mg | ORAL_TABLET | Freq: Every day | ORAL | Status: DC

## 2012-12-07 MED ORDER — INSULIN ASPART 100 UNIT/ML ~~LOC~~ SOLN: 0.0000 [IU] | Freq: Three times a day (TID) | SUBCUTANEOUS | Status: DC

## 2012-12-07 MED ORDER — POLYETHYLENE GLYCOL 3350 17 G PO PACK: 17.0000 g | PACK | Freq: Every day | ORAL | Status: DC

## 2012-12-07 MED ORDER — CLOPIDOGREL BISULFATE 75 MG PO TABS: 75.0000 mg | ORAL_TABLET | Freq: Every day | ORAL | Status: DC

## 2012-12-07 MED ORDER — HYDROCODONE-ACETAMINOPHEN 5-325 MG PO TABS: 1.0000 | ORAL_TABLET | Freq: Four times a day (QID) | ORAL | Status: DC | PRN

## 2012-12-07 NOTE — Assessment & Plan Note (Signed)
Sacral area is red, dry, blanchable with multiple small areas of open skin. Protect with Mepitel dressing, change every other day. Reassess Monday

## 2012-12-07 NOTE — Assessment & Plan Note (Signed)
Blood pressure remains well controlled on current medications.

## 2012-12-07 NOTE — Assessment & Plan Note (Signed)
Left hip surgical dressing remains in place, leg without edema, foot remains with 1+ edema. Pain management will be resumed with hydrocodone b.i.d. and 6 and q.6 hours p.r.n. to aid in more comfortable transfers and participation with PT and OT. Patient remains in knee immobilizer at all times.

## 2012-12-07 NOTE — Assessment & Plan Note (Signed)
CBGs remains well controlled, continue every other day fasting measurement.

## 2012-12-07 NOTE — Discharge Summary (Signed)
Physician Discharge Summary  George Kemp ZOX:[id number] DOB: 1927/04/17 DOA: 12/06/2012  PCP: Kimber Relic, MD  Admit date: 12/06/2012 Discharge date: 12/07/2012  Time spent: 35 minutes  Recommendations for Outpatient Follow-up:  1. Please repeat Hb.  2. Please consider resume aspirin, Plavix for stroke prevention.   Discharge Diagnoses:    Postoperative anemia due to acute blood loss   Closed left hip fracture   HTN (hypertension)   Diabetes   Dementia   Pressure ulcer of left knee   Unspecified late effects of cerebrovascular disease   Unspecified constipation   Discharge Condition: Stable  Diet recommendation: Carb Modified.   Filed Weights   12/06/12 1754  Weight: 75.4 kg (166 lb 3.6 oz)    History of present illness:  George Kemp is a 79 y.o. male with a history of dementia, previous stroke and recent left hip fracture and repair. Directly admitted for blood transfusion. Hemoglobin was found to be 7.0 at skilled nursing facility. No bleeding noted. No shortness of breath, chest pain. Patient has been very constipated since the fracture. Last bowel movement was 3 days ago despite aggressive bowel regimen.   Hospital Course:  1- Postoperative anemia due to acute blood loss: Repeat hemoglobin in the hospital on admission was at 7.7. Patient received 1 unit of PRBC.  No evidence of ongoing bleeding. Hb post transfusion increase to 8.7.   2-Closed left hip fracture: Continue physical therapy. Will give Lovenox while here. Has an appointment with Dr. Shelle Iron in the near future  3-HTN (hypertension)  4-Diabetes, diet controlled ; SSI.  Dementia  Pressure ulcer of left knee, stage I: Continue foam dressing  History of stroke with left hemiparesis. Patient had previously been on aspirin and Plavix. Please resume this medications when clarify with family.   Procedures:  none  Consultations:  None  Discharge Exam: Filed Vitals:   12/06/12 2355 12/07/12 0055 12/07/12  0117 12/07/12 0514  BP: 129/64 115/62 111/56 121/57  Pulse: 85 87 86 86  Temp: 98.3 F (36.8 C) 98.2 F (36.8 C) 98.7 F (37.1 C) 98.1 F (36.7 C)  TempSrc: Oral Oral Oral Oral  Resp: 28 24 24 20   Height:      Weight:      SpO2:    96%    General: No distress.  Cardiovascular: S 1, S 2 RRR Respiratory: CTA Abdomen: soft, Nt   Discharge Instructions  Discharge Orders   Future Orders Complete By Expires     Diet - low sodium heart healthy  As directed     Increase activity slowly  As directed         Medication List         acetaminophen 325 MG tablet  Commonly known as:  TYLENOL  Take 650 mg by mouth every 6 (six) hours as needed for pain.     amLODipine 5 MG tablet  Commonly known as:  NORVASC  Take 5 mg by mouth every morning.     ANAFRANIL 25 MG capsule  Generic drug:  clomiPRAMINE  Take 25 mg by mouth every morning.     atorvastatin 20 MG tablet  Commonly known as:  LIPITOR  Take 20 mg by mouth every morning.     cholecalciferol 1000 UNITS tablet  Commonly known as:  VITAMIN D  Take 1,000 Units by mouth every morning.     donepezil 10 MG tablet  Commonly known as:  ARICEPT  Take 10 mg by mouth daily.  DSS 100 MG Caps  Take 100 mg by mouth 2 (two) times daily.     HYDROcodone-acetaminophen 5-325 MG per tablet  Commonly known as:  NORCO  Take 1-2 tablets by mouth every 6 (six) hours as needed for pain.     latanoprost 0.005 % ophthalmic solution  Commonly known as:  XALATAN  Place 1 drop into both eyes at bedtime.     loratadine 10 MG tablet  Commonly known as:  CLARITIN  Take 10 mg by mouth daily as needed for allergies.     metoprolol tartrate 25 MG tablet  Commonly known as:  LOPRESSOR  Take 1 tablet (25 mg total) by mouth 2 (two) times daily.     multivitamin with minerals Tabs  Take 1 tablet by mouth daily.     polyethylene glycol packet  Commonly known as:  MIRALAX / GLYCOLAX  Take 17 g by mouth daily.     QUEtiapine 25 MG  tablet  Commonly known as:  SEROQUEL  Take 25 mg by mouth at bedtime.     senna 8.6 MG Tabs  Commonly known as:  SENOKOT  Take 2 tablets (17.2 mg total) by mouth at bedtime.     tamsulosin 0.4 MG Caps  Commonly known as:  FLOMAX  Take 0.4 mg by mouth daily after breakfast.       No Known Allergies    The results of significant diagnostics from this hospitalization (including imaging, microbiology, ancillary and laboratory) are listed below for reference.    Significant Diagnostic Studies: Dg Chest 2 View  12/01/2012   *RADIOLOGY REPORT*  Clinical Data: Pain.  History of stroke and hypertension.  Preop left hip pain.  CHEST - 2 VIEW  Comparison: None.  Findings: Shallow inspiration with linear atelectasis in the lung bases.  No blunting of costophrenic angles.  No pneumothorax. Mediastinal contours appear intact.  Normal heart size and pulmonary vascularity.  Degenerative changes in the spine.  IMPRESSION: Shallow inspiration with atelectasis in the lung bases.   Original Report Authenticated By: Burman Nieves, M.D.   Dg Pelvis 1-2 Views  11/30/2012   *RADIOLOGY REPORT*  Clinical Data: Left hip pain.  PELVIS - 1-2 VIEW  Comparison: None.  Findings: Subcapital fracture of the left hip varus angulation of the fracture fragments.  No dislocation.  Degenerative changes in both hip.  The pelvis appears intact.  The pelvic rim, SI joints, and symphysis are not displaced.  No focal bone lesion appreciated. Vascular calcifications.  IMPRESSION: Subcapital fracture of the proximal left femur with varus angulation of the fracture fragments.  No displaced pelvic fractures identified.   Original Report Authenticated By: Burman Nieves, M.D.   Dg Femur Left  11/30/2012   *RADIOLOGY REPORT*  Clinical Data: Fall  LEFT FEMUR - 2 VIEW  Comparison: None  Findings: Osseous demineralization. Displaced subcapital fracture left femur. Left hip joint space narrowing. No additional fracture, dislocation or bone  destruction. Scattered atherosclerotic calcifications. Visualized portion of the left pelvis intact.  IMPRESSION: Osseous demineralization. Displaced subcapital fracture left femur.   Original Report Authenticated By: Ulyses Southward, M.D.   Dg Pelvis Portable  12/01/2012   *RADIOLOGY REPORT*  Clinical Data: Left proximal femur fracture.  PORTABLE PELVIS  Comparison: Radiographs dated 11/30/2012  Findings: The patient has undergone left hemiarthroplasty.  The proximal femoral prosthesis appears in excellent position in the AP projection.  The pelvis is otherwise normal.  IMPRESSION: Satisfactory appearance of the pelvis after the left hip hemiarthroplasty.   Original  Report Authenticated By: Francene Boyers, M.D.   Dg Hip Portable 1 View Left  12/01/2012   *RADIOLOGY REPORT*  Clinical Data: Left hip fracture.  PORTABLE LEFT HIP - 1 VIEW  Comparison: Radiographs dated 11/30/2012  Findings: The patient has undergone left hemiarthroplasty.  The proximal left femoral prosthesis appears in excellent position in the AP projection.  No fracture.  IMPRESSION: Satisfactory appearance of the left hip in the AP projection after left hemiarthroplasty.   Original Report Authenticated By: Francene Boyers, M.D.    Microbiology: Recent Results (from the past 240 hour(s))  MRSA PCR SCREENING     Status: None   Collection Time    12/01/12  6:53 AM      Result Value Range Status   MRSA by PCR NEGATIVE  NEGATIVE Final   Comment:            The GeneXpert MRSA Assay (FDA     approved for NASAL specimens     only), is one component of a     comprehensive MRSA colonization     surveillance program. It is not     intended to diagnose MRSA     infection nor to guide or     monitor treatment for     MRSA infections.     Labs: Basic Metabolic Panel:  Recent Labs Lab 11/30/12 2302 12/01/12 0520 12/02/12 0520 12/06/12 1909  NA 142 143 140 144  K 3.9 4.1 4.4 3.8  CL 106 108 108 108  CO2 24 26 26 30   GLUCOSE 139* 166*  211* 150*  BUN 18 16 15  28*  CREATININE 1.26 1.17 1.36* 1.25  CALCIUM 9.5 8.7 8.4 8.6   Liver Function Tests:  Recent Labs Lab 11/30/12 2302  AST 23  ALT 16  ALKPHOS 84  BILITOT 0.7  PROT 6.4  ALBUMIN 3.7   No results found for this basename: LIPASE, AMYLASE,  in the last 168 hours No results found for this basename: AMMONIA,  in the last 168 hours CBC:  Recent Labs Lab 11/30/12 2302 12/01/12 0520 12/02/12 0520 12/06/12 1909 12/07/12 0405  WBC 8.6 7.5 11.2* 7.6  --   NEUTROABS 6.1  --   --  5.1  --   HGB 12.3* 11.8* 9.2* 7.7* 8.7*  HCT 36.1* 35.1* 28.6* 23.4* 25.8*  MCV 91.9 93.9 96.3 95.1  --   PLT 161 153 135* 172  --    Cardiac Enzymes: No results found for this basename: CKTOTAL, CKMB, CKMBINDEX, TROPONINI,  in the last 168 hours BNP: BNP (last 3 results) No results found for this basename: PROBNP,  in the last 8760 hours CBG:  Recent Labs Lab 12/02/12 1730 12/02/12 2207 12/03/12 0752 12/03/12 1139 12/06/12 2000  GLUCAP 168* 143* 155* 175* 119*       Signed:  Sylvester Salonga  [facility] 12/07/2012, 9:21 AM

## 2012-12-07 NOTE — Progress Notes (Signed)
Patient ID: George Kemp, male   DOB: 28-Jun-1926, 79 y.o.   MRN: [id number] [facility] [id number])  Chief Complaint  Patient presents with  . Hospitalization Follow-up    Anemia    HPI: This  79 y.o. male resident of [facility], Skilled Nursing section is readmitted today after blood transfusion.   This patient's hemoglobin  dropped from 9.2 at hospital discharge to 7.0 on 12/07/12. He was mildly tachycardic, blood pressure remained stable. Has required supplemental oxygen due to mildly reduced oxygen saturations. Transfusion was recommended, family was in agreement, [facility] agreed to direct admission. Repeat hemoglobin on hospital admission was 7.7, patient patient was typed and crossed and transfused with one unit of packed red blood cells. He remained hemodynamically stable overnight, hemoglobin this morning was improved 8.7. Patient was transferred back to the skilled nursing section at [facility] this afternoon.   No Known Allergies Medications  Current outpatient prescriptions:acetaminophen (TYLENOL) 325 MG tablet, Take 650 mg by mouth every 6 (six) hours as needed for pain., Disp: , Rfl: ;  amLODipine (NORVASC) 5 MG tablet, Take 5 mg by mouth every morning., Disp: , Rfl: ;  atorvastatin (LIPITOR) 20 MG tablet, Take 20 mg by mouth every morning., Disp: , Rfl: ;  cholecalciferol (VITAMIN D) 1000 UNITS tablet, Take 1,000 Units by mouth every morning., Disp: , Rfl:  clomiPRAMINE (ANAFRANIL) 25 MG capsule, Take 25 mg by mouth every morning., Disp: , Rfl: ;  donepezil (ARICEPT) 10 MG tablet, Take 10 mg by mouth daily., Disp: , Rfl: ;  HYDROcodone-acetaminophen (NORCO/VICODIN) 5-325 MG per tablet, Take 1 tablet by mouth 2 (two) times daily. Give 1 tablet b.i.d. (9 AM, 9 PM). May also have 1 tablet every 6 hours as needed for pain., Disp: , Rfl:  latanoprost (XALATAN) 0.005 % ophthalmic solution, Place 1 drop into both eyes at bedtime., Disp: , Rfl: ;   loratadine (CLARITIN) 10 MG tablet, Take 10 mg by mouth daily as needed for allergies., Disp: , Rfl: ;  metoprolol tartrate (LOPRESSOR) 25 MG tablet, Take 1 tablet (25 mg total) by mouth 2 (two) times daily., Disp: , Rfl: ;  Multiple Vitamin (MULTIVITAMIN WITH MINERALS) TABS, Take 1 tablet by mouth daily., Disp: , Rfl:  polyethylene glycol (MIRALAX / GLYCOLAX) packet, Take 17 g by mouth daily., Disp: 14 each, Rfl: 0;  QUEtiapine (SEROQUEL) 25 MG tablet, Take 25 mg by mouth at bedtime., Disp: , Rfl: ;  senna (SENOKOT) 8.6 MG TABS, Take 2 tablets (17.2 mg total) by mouth at bedtime., Disp: 120 each, Rfl: 0;  tamsulosin (FLOMAX) 0.4 MG CAPS, Take 0.4 mg by mouth daily after breakfast., Disp: , Rfl:   DATA REVIEWED  Laboratory Studies:   [facility], external 12/04/2012 WBC 7.0, hemoglobin 7.6, hematocrit 22.5, platelet 118  Glucose 145, BUN 28, creatinine 1.38, sodium 144, potassium 4.3 12/06/2012: WBC 6.7, hemoglobin 7.0, hematocrit 20.7, platelets 179   Hospital Lab: Lab Results  Component Value Date   WBC 7.6 12/06/2012   HGB 8.7* 12/07/2012   HCT 25.8* 12/07/2012   MCV 95.1 12/06/2012   PLT 172 12/06/2012   Past Medical History  Diagnosis Date  . Stroke 08/2009    Left side with residual deficit  . Hypertension   . Bipolar 1 disorder   . Dementia   . Diabetes   . Pressure ulcer of sacrum 12/07/2012   Past Surgical History  Procedure Laterality Date  . Hip arthroplasty Left 12/01/2012    Procedure: ARTHROPLASTY BIPOLAR HIP;  Surgeon: Javier Docker, MD;  Location: [facility] ORS;  Service: Orthopedics;  Laterality: Left;     Family History  Problem Relation Age of Onset  . Stroke Mother   . Congestive Heart Failure Mother   . Stroke Brother      X 2  . Diabetes Brother   . Cancer - Other Father     Unknown  Type  . Thyroid disease Daughter   . Cancer Father    History   Social History Narrative   Married, Hilda Lias. Resides at [facility], Skilled nursing section since  05/2012. Spouse lives in [place] section of same community. He is a retired IT trainer. Stopped smoking many years ago, drinks minimal alcohol. Pt. Has Advanced Directives: DNR.    Review of Systems  DATA OBTAINED: from patient, nurse, medical record, GENERAL: Feels "OK"   No fevers, change in appetite or weight.   SKIN: No itch, rash  EYES: No eye pain, dryness or itching  EARS: No earache, tinnitus, change in hearing NOSE: No congestion, drainage or bleeding MOUTH/THROAT: No mouth or tooth pain  No sore throat No difficulty chewing or swallowing RESPIRATORY: No cough, wheezing, SOB  CARDIAC: No chest pain, palpitations  No edema. GI: No abdominal pain  No N/V/D  No heartburn or reflux  Last BM last evening GU: condom cathete placed in hospital    MUSCULOSKELETAL: Surgical left hip pain  No back pain  No muscle ache, pain, weakness   NEUROLOGIC: No dizziness, fainting, headache  No change in mental status (dementia).  PSYCHIATRIC: No feelings of anxiety, depression Sleeps well.  No behavior issue.   Physical Exam Filed Vitals:   12/07/12 2004  BP: 127/74  Pulse: 83  Temp: 97.7 F (36.5 C)  Resp: 16  SpO2: 97%    GENERAL APPEARANCE: No acute distress, appropriately groomed, normal body habitus. Alert, pleasant, conversant. SKIN: No diaphoresis rash, unusual lesions.  Left patella with stage I pressure injury, unchanged.  Sacrum is red, dry, with multiple small superficial areas of skin breakdown. HEAD: Normocephalic, atraumatic EYES: Conjunctiva/lids clear. Pupils round, reactive.   EARS: Decreased Hearing  NOSE: No deformity or discharge. MOUTH/THROAT: Lips w/o lesions. Oral mucosa, tongue moist, w/o lesion. Oropharynx w/o redness or lesions.  NECK: Supple, full ROM. No thyroid tenderness, enlargement or nodule LYMPHATICS: No head, neck or supraclavicular adenopathy RESPIRATORY: Breathing is even, unlabored. Lung crackles at bilateral bases CARDIOVASCULAR: Heart RRR. No murmur or extra  heart sounds   EDEMA: No peripheral or periorbital edema. GASTROINTESTINAL: Abdomen is soft, non-tender, mildly distended w/ normal bowel sounds. No CVA tenderness GENITOURINARY: Bladder non tender, not distended.  MALE: No penile lesion or drainage. No scrotal edema MUSCULOSKELETAL: Moves UE with full ROM, strength and tone. Back is without kyphosis, scoliosis or spinal process tenderness.  Left hip incision covered with surgical dressing, no surrounding erythema, edema or ecchymosis. Left leg with no edema, foot 1+ edema. ACE wrap/ Knee immobilizer with patella padding replaced after exam.  PSYCHIATRIC: Mood and affect appropriate to situation  ASSESSMENT/PLAN  Postoperative anemia due to acute blood loss Patient history and status post transfusion of 1 unit packed red blood cells, posttransfusion hemoglobin 8.7. Will start iron supplementation, restart Plavix tomorrow. Repeat CBC 12/10/2012.  Pressure ulcer of left knee Pressure injury to left knee is stable, no further progression. Continue coverage with protective dressing and packing under knee immobilizer.  Pressure ulcer of sacrum Sacral area is red, dry, blanchable with multiple small areas of open skin.  Protect with Mepitel dressing, change every other day. Reassess Monday  Unspecified constipation Pt. Reportedly had BM last evening. Abdomen with normal BS today, continue Miralax and Senna.   Closed left hip fracture Left hip surgical dressing remains in place, leg without edema, foot remains with 1+ edema. Pain management will be resumed with hydrocodone b.i.d. and 6 and q.6 hours p.r.n. to aid in more comfortable transfers and participation with PT and OT. Patient remains in knee immobilizer at all times.    HTN (hypertension) Blood pressure remains well-controlled on current medications.  Diabetes CBGs remains well controlled, continue every other day fasting measurement.     Follow up:  Lab 12/10/12.  Lauriann Milillo T.Dariela Stoker,  NP-C 12/07/2012

## 2012-12-07 NOTE — Assessment & Plan Note (Signed)
Patient history and status post transfusion of 1 unit packed red blood cells, posttransfusion hemoglobin 8.7. Will start iron supplementation, restart Plavix tomorrow. Repeat CBC 12/10/2012.

## 2012-12-07 NOTE — Assessment & Plan Note (Signed)
Pt. Reportedly had BM last evening. Abdomen with normal BS today, continue Miralax and Senna.

## 2012-12-07 NOTE — Progress Notes (Signed)
Patient cleared for discharge. Packet copied and placed in [place]. ptar called for transportation. Patient and daughter at bedside aware and agreeable for transfer back. They are excited to return home.  Mykeal Carrick C. Roni Scow MSW, LCSW [phone]

## 2012-12-07 NOTE — Care Management Note (Signed)
    Page 1 of 1   12/07/2012     12:12:50 PM   CARE MANAGEMENT NOTE 12/07/2012  Patient:  George Kemp, George Kemp   Account Number:  [id number]  Date Initiated:  12/07/2012  Documentation initiated by:  Lanier Clam  Subjective/Objective Assessment:   ADMITTED W/ANEMIA.     Action/Plan:   FROM SNF.   Anticipated DC Date:  12/07/2012   Anticipated DC Plan:  SKILLED NURSING FACILITY      DC Planning Services  CM consult      Choice offered to / List presented to:             Status of service:  Completed, signed off Medicare Important Message given?   (If response is "NO", the following Medicare IM given date fields will be blank) Date Medicare IM given:   Date Additional Medicare IM given:    Discharge Disposition:  SKILLED NURSING FACILITY  Per UR Regulation:  Reviewed for med. necessity/level of care/duration of stay  If discussed at Long Length of Stay Meetings, dates discussed:    Comments:  12/07/12 Krimson Massmann RN,BSN NCM [phone] D/C SNF.

## 2012-12-07 NOTE — Clinical Social Work Psychosocial (Signed)
     Clinical Social Work Department BRIEF PSYCHOSOCIAL ASSESSMENT 12/07/2012  Patient:  George Kemp, George Kemp     Account Number:  [id number]     Admit date:  12/06/2012  Clinical Social Worker:  Hattie Perch  Date/Time:  12/07/2012 12:00 M  Referred by:  Physician  Date Referred:  12/07/2012 Referred for  SNF Placement   Other Referral:   Interview type:  Family Other interview type:    PSYCHOSOCIAL DATA Living Status:  FACILITY Admitted from facility:  [facility] Level of care:  Skilled Nursing Facility Primary support name:  George Kemp Primary support relationship to patient:  CHILD, ADULT Degree of support available:   good    CURRENT CONCERNS Current Concerns  Post-Acute Placement   Other Concerns:    SOCIAL WORK ASSESSMENT / PLAN CSW went to meet with patient. patient was sleeping. CSW spoke with patient's daughter, George Kemp. She confirms that patient is a long term care resident at [facility] and will return there upon medical clearance. CSW explained patient is ready for discharge today.   Assessment/plan status:   Other assessment/ plan:   Information/referral to community resources:    PATIENTS/FAMILYS RESPONSE TO PLAN OF CARE: Daughter, George Kemp, is ecstatic that patient is doing better and is ready to return to his home, [facility], today.

## 2012-12-07 NOTE — Assessment & Plan Note (Signed)
Pressure injury to left knee is stable, no further progression. Continue coverage with protective dressing and packing under knee immobilizer.

## 2012-12-10 ENCOUNTER — Encounter: Payer: Self-pay | Admitting: Geriatric Medicine

## 2012-12-10 ENCOUNTER — Non-Acute Institutional Stay (SKILLED_NURSING_FACILITY): Payer: Medicare Other | Admitting: Geriatric Medicine

## 2012-12-10 DIAGNOSIS — S72002D Fracture of unspecified part of neck of left femur, subsequent encounter for closed fracture with routine healing: Secondary | ICD-10-CM

## 2012-12-10 DIAGNOSIS — L89891 Pressure ulcer of other site, stage 1: Secondary | ICD-10-CM

## 2012-12-10 DIAGNOSIS — L89899 Pressure ulcer of other site, unspecified stage: Secondary | ICD-10-CM

## 2012-12-10 DIAGNOSIS — D62 Acute posthemorrhagic anemia: Secondary | ICD-10-CM

## 2012-12-10 DIAGNOSIS — B369 Superficial mycosis, unspecified: Secondary | ICD-10-CM

## 2012-12-10 DIAGNOSIS — R3981 Functional urinary incontinence: Secondary | ICD-10-CM

## 2012-12-10 DIAGNOSIS — S72009D Fracture of unspecified part of neck of unspecified femur, subsequent encounter for closed fracture with routine healing: Secondary | ICD-10-CM

## 2012-12-10 DIAGNOSIS — L8991 Pressure ulcer of unspecified site, stage 1: Secondary | ICD-10-CM

## 2012-12-10 DIAGNOSIS — I10 Essential (primary) hypertension: Secondary | ICD-10-CM

## 2012-12-10 MED ORDER — NYSTATIN-TRIAMCINOLONE 100000-0.1 UNIT/GM-% EX OINT: TOPICAL_OINTMENT | Freq: Two times a day (BID) | CUTANEOUS | Status: DC

## 2012-12-10 NOTE — Assessment & Plan Note (Signed)
No further skin breakdown at left knee, continue to pad this knee well under the knee immobilizer.

## 2012-12-10 NOTE — Assessment & Plan Note (Addendum)
Surgical dressing removed today, incision is clean dry intact, skin staples intact. Covered with dry gauze. Left leg with no edema, mild edema to remains in his foot. Removed Ace wrap today. PT in progress. Patient for followup with Dr.Beane on Friday, July 18

## 2012-12-10 NOTE — Assessment & Plan Note (Signed)
Blood pressure has remained satisfactory of the last several days with range 118-147/57-92. Heart rate has remained mildly elevated 91-106 beats per minute. Patient continues amlodipine as well as b.i.d. metoprolol that was started during hospitalization.

## 2012-12-10 NOTE — Assessment & Plan Note (Signed)
This patient with new urinary incontinence since his hip fracture. Condom catheter was utilized during hospitalization, attempt to have him return to using urinal was unsuccessful. He voids frequently, is unaware of voiding.  Has contributed to skin breakdown. Continue condom catheter use per facility protocol.

## 2012-12-10 NOTE — Assessment & Plan Note (Signed)
Status post transfusion 1 unit packed red blood cells on 12/07/2012. Patient without adverse reaction. Vital signs have remained stable, heart rate remains mildly elevated. He no longer requires supplemental oxygen. Repeat CBC was drawn this morning, results are pending

## 2012-12-10 NOTE — Progress Notes (Signed)
Patient ID: George Kemp, male   DOB: 27-Jul-1926, 79 y.o.   MRN: [id number] [facility] [id number])  Chief Complaint  Patient presents with  . Anemia  . Skin issues  . Hip Injury    HPI: This 79 year old male resident of [facility] [place] nursing section is reevaluated today in followup of transfusion for postoperative anemia as well as skin issues. This patient was transfused with one unit of packed red blood cells on Friday, July 11 due to hemoglobin that dropped from 9.6-7.0 in several days. He's had no sign of active bleeding, and vital signs have been stable, repeat CBC is pending today today. I return from hospital on July 11 patient was noted to have some skin breakdown on his sacral area. It was unclear whether this was pressure related or fungal related. The area was covered for the weekend and re-examined today. This patient is 10 days status post repair of left hip fracture pain is adequately controlled with current medications, he is beginning to dissipate with PT and OT. The immobilizer remains in place, stage I skin breakdown on his left knee is being protected with extra padding. Left hip incision is healing well.  Bathing: Dependent Bed Mobility: Maximal Assist Bladder Management: condom cath, Bowel Management: Bed pan Feeding: Supervision Hygiene and Grooming: Maximal Assist, Toileting / Clothing: Maximal Assist Transfers: Dependent, Hoyer Lift   No Known Allergies Medications Reviewed  DATA REVIEWED  Laboratory Studies:  [facility], external    12/04/2012 WBC 7.0, hemoglobin 7.6, hematocrit 22.5, platelet 118     Glucose 145, BUN 28, creatinine 1.38, sodium 144, potassium 4.3    12/06/2012: WBC 6.7, hemoglobin 7.0, hematocrit 20.7, platelets 179 Hospital Lab Lab Results  Component Value Date   HGB 8.7* 12/07/2012   Lab Results  Component Value Date   HCT 25.8* 12/07/2012     Review of Systems   DATA OBTAINED: from patient,  nurse, medical record,  GENERAL: Feels "OK" No fevers, change in appetite or weight.  SKIN: No itch,  EYES: No eye pain, dryness or itching  EARS: No earache, tinnitus, change in hearing  NOSE: No congestion, drainage or bleeding  MOUTH/THROAT: No mouth or tooth pain No sore throat No difficulty chewing or swallowing  RESPIRATORY: No cough, wheezing, SOB  CARDIAC: No chest pain, palpitations No edema.  GI: No abdominal pain No N/V/D No heartburn or reflux Last BM last evening  GU: Incontinent, condom catheter in use  MUSCULOSKELETAL: MildSurgical left hip pain No back pain No muscle ache, pain, weakness  NEUROLOGIC: No dizziness, fainting, headache No change in mental status (dementia).  PSYCHIATRIC:  Sleeps well. No behavior issue.  Nurse reports this morning patient appeared anxious, not wanting to be by himself.  Physical Exam Filed Vitals:   12/10/12 1125  BP: 147/74  Pulse: 91  Temp: 97.2 F (36.2 C)  SpO2: 96%   GENERAL APPEARANCE: No acute distress, appropriately groomed, normal body habitus. Alert, pleasant, conversant.  SKIN: No diaphoresis Left patella with stage I pressure injury, unchanged. Sacrum is red, dry, flaky with 2 small superficial areas of skin breakdown, satellite lesions surround entire periphery.   HEAD: Normocephalic, atraumatic  EYES: Conjunctiva/lids clear. Pupils round, reactive.  EARS: Decreased Hearing  NOSE: No deformity or discharge.  MOUTH/THROAT: Lips w/o lesions. Oral mucosa, tongue moist, w/o lesion. Oropharynx w/o redness or lesions.  NECK: Supple, full ROM. No thyroid tenderness, enlargement or nodule  LYMPHATICS: No head, neck or supraclavicular adenopathy  RESPIRATORY: Breathing  is even, unlabored. Lung crackles at bilateral bases  CARDIOVASCULAR: Heart RRR. No murmur or extra heart sounds  EDEMA: No peripheral or periorbital edema.  GASTROINTESTINAL: Abdomen is soft, non-tender, mildly distended w/ normal bowel sounds. No CVA tenderness   GENITOURINARY: Bladder non tender, not distended.  MALE: No penile lesion or drainage. No scrotal edema  MUSCULOSKELETAL: Moves UE with full ROM, strength and tone. Back is without kyphosis, scoliosis or spinal process tenderness. Surgical dressing removed today, Left hip incision C/D, skin staples intact. No surrounding erythema, edema or ecchymosis. Left leg with no edema, foot 1+ edema. ACE wrap removed/ Knee immobilizer with patella padding replaced after exam.  PSYCHIATRIC: Mood and affect appropriate to situation  ASSESSMENT/PLAN  Closed left hip fracture Surgical dressing removed today, incision is clean dry intact, skin staples intact. Covered with dry gauze. Left leg with no edema, mild edema to remains in his foot. Removed Ace wrap today. PT in progress. Patient for followup with Dr.Beane on Friday, July 18  Postoperative anemia due to acute blood loss Status post transfusion 1 unit packed red blood cells on 12/07/2012. Patient without adverse reaction. Vital signs have remained stable, heart rate remains mildly elevated. He no longer requires supplemental oxygen. Repeat CBC was drawn this morning, results are pending  Pressure ulcer of left knee No further skin breakdown at left knee, continue to pad this knee well under the knee immobilizer.  Fungal dermatitis Skin breakdown on his sacrum noted 12/07/2012. Covered for the weekend with Mepilex dressing. Today skin is less red, no signs of pressure however skin is flaking mildly red with satellite lesions. He signs consistent with fungal dermatitis. Will treat with Mycolog-II cream b.i.d. for one week.  Urinary incontinence due to immobility This patient with new urinary incontinence since his hip fracture. Condom catheter was utilized during hospitalization, attempt to have him return to using urinal was unsuccessful. He voids frequently, is unaware of voiding.  Has contributed to skin breakdown. Continue condom catheter use per  facility protocol.   HTN (hypertension) Blood pressure has remained satisfactory of the last several days with range 118-147/57-92. Heart rate has remained mildly elevated 91-106 beats per minute. Patient continues amlodipine as well as b.i.d. metoprolol that was started during hospitalization.     Follow up: Routine or as needed  CBC, BMP pending today  Clydie Dillen T.Jaydynn Wolford, NP-C 12/10/2012

## 2012-12-10 NOTE — Assessment & Plan Note (Signed)
Skin breakdown on his sacrum noted 12/07/2012. Covered for the weekend with Mepilex dressing. Today skin is less red, no signs of pressure however skin is flaking mildly red with satellite lesions. He signs consistent with fungal dermatitis. Will treat with Mycolog-II cream b.i.d. for one week.

## 2012-12-27 ENCOUNTER — Encounter: Payer: Self-pay | Admitting: Geriatric Medicine

## 2012-12-27 ENCOUNTER — Non-Acute Institutional Stay (SKILLED_NURSING_FACILITY): Payer: Medicare Other | Admitting: Geriatric Medicine

## 2012-12-27 DIAGNOSIS — S72009D Fracture of unspecified part of neck of unspecified femur, subsequent encounter for closed fracture with routine healing: Secondary | ICD-10-CM

## 2012-12-27 DIAGNOSIS — S72002D Fracture of unspecified part of neck of left femur, subsequent encounter for closed fracture with routine healing: Secondary | ICD-10-CM

## 2012-12-27 DIAGNOSIS — D62 Acute posthemorrhagic anemia: Secondary | ICD-10-CM

## 2012-12-27 DIAGNOSIS — F319 Bipolar disorder, unspecified: Secondary | ICD-10-CM

## 2012-12-27 NOTE — Assessment & Plan Note (Signed)
Repeat hemoglobin stable one week after transfusion. Continue iron supplementation for 30 days, repeat CBC 01/17/2013

## 2012-12-27 NOTE — Assessment & Plan Note (Signed)
Mild depressive symptoms evident since patient's hip fracture.. Patient's mood improves when he is able to be out of bed and out of his room. We'll continue to engage him in some usual activities, continue to monitor mood and behavior. No medication adjustment at this time

## 2012-12-27 NOTE — Progress Notes (Signed)
Patient ID: George Kemp, male   DOB: 1927-02-21, 79 y.o.   MRN: [id number] [facility] [phone])  Chief Complaint  Patient presents with  . Hip Injury  . Depression   Code Status: DNR, MOST form  Contact Information   Name Relation Home Work Mobile   George Kemp Daughter [phone]  [phone]   George Kemp, George Kemp [phone]  [phone]      HPI: This 79 year old male resident of [facility], [facility] nursing section evaluated today in followup of hip fracture, anemia, depression.  He is s/p repair left hip frcature 7514. PAin is well managed with BID hydrocodone, minimal use of prn medication. Continues with hip precautions including knee immobilizer while in bed, abductor pillow while in WC. Pt. is working with PT/OT, continues with weight bearing limitaiton on left. Is able to mobilize in Manual WC.  S/P  transfusion 12/07/12 for postoperative anemia with Hgb 7.0. repat H/H is stable. On return from hospital on July 11 patient was noted to have some skin breakdown on his sacral area. This has been treated with antifungal cream/ pressure reduction. Skin is healed per nursing report. At last week's Care Plan meeting, there was concern expressed re: Depression. Pt. Had weoght loss and appeared more withdrawn. As he has been able to be out of bed and resume some social activity, mood has improved.  Functional Status: Bathing: Dependent Bed Mobility: Maximal Assist Bladder Management: Freq. Incontinence, urinal, diaper/ pads Bowel Management: Bed pan Feeding: Supervision Hygiene and Grooming: Maximal Assist, Toileting / Clothing: Maximal Assist Transfers: Dependent, Hoyer Lift   No Known Allergies Medications Reviewed  DATA REVIEWED  Laboratory Studies:  [facility], external    12/04/2012 WBC 7.0, hemoglobin 7.6, hematocrit 22.5, platelet 118     Glucose 145, BUN 28, creatinine 1.38, sodium 144, potassium 4.3    12/06/2012: WBC 6.7,  hemoglobin 7.0, hematocrit 20.7, platelets 179      12/10/2012 WBC 9.1, hemoglobin 9.7, hematocrit 20.9, platelet 378    Glucose 139, BUN 23, creatinine 1.25, sodium 146, potassium 4.3   12/18/2012 WBC 7.1, hemoglobin 9.5, hematocrit 28.0, platelets 479    Hospital Lab Lab Results  Component Value Date   HGB 8.7* 12/07/2012   Lab Results  Component Value Date   HCT 25.8* 12/07/2012   Review of Systems   DATA OBTAINED: from patient, nurse, medical record,  GENERAL: Feels "OK" No fevers, change in appetite or weight.  SKIN: No itch,  EYES: No eye pain, dryness or itching  EARS: No earache, tinnitus, change in hearing  NOSE: No congestion, drainage or bleeding  MOUTH/THROAT: No mouth or tooth pain No sore throat No difficulty chewing or swallowing  RESPIRATORY: No cough, wheezing, SOB  CARDIAC: No chest pain, palpitations No edema.  GI: No abdominal pain No N/V/D No heartburn or reflux  GU: Frequently Incontinent, no dysuria MUSCULOSKELETAL: No left hip pain, No back pain No muscle ache, pain, weakness  NEUROLOGIC: No dizziness, fainting, headache No change in mental status (dementia).  PSYCHIATRIC:  Sleeps well. No behavior issue. Some depressive symptoms  Physical Exam Filed Vitals:   12/27/12 1517  BP: 125/71  Pulse: 97  Weight: 160 lb (72.576 kg)   GENERAL APPEARANCE: No acute distress, appropriately groomed, normal body habitus. Alert, pleasant, conversant.  SKIN: No diaphoresis Left patella with healing stage I pressure injury, unchanged.  HEAD: Normocephalic, atraumatic  EYES: Conjunctiva/lids clear. Pupils round, reactive.  EARS: Decreased Hearing  NOSE: No deformity or discharge.  MOUTH/THROAT: Lips w/o  lesions. Oral mucosa, tongue moist, w/o lesion. Oropharynx w/o redness or lesions.  NECK: Supple, full ROM. No thyroid tenderness, enlargement or nodule  LYMPHATICS: No head, neck or supraclavicular adenopathy  RESPIRATORY: Breathing is even, unlabored. Lungs sounds  clear and full throughout  CARDIOVASCULAR: Heart RRR. No murmur or extra heart sounds   EDEMA: No peripheral or periorbital edema.  GASTROINTESTINAL: Abdomen is soft, non-tender, distended w/ normal bowel sounds. No CVA tenderness  GENITOURINARY: Bladder non tender, not distended.  MUSCULOSKELETAL: Moves UE with full ROM, strength and tone. Back is without kyphosis, scoliosis or spinal process tenderness. Left hip incision healed. No surrounding erythema, edema or ecchymosis. Left leg/foot with no edema.   Knee immobilizer replaced after exam.  PSYCHIATRIC: Mood and affect appropriate to situation  ASSESSMENT/PLAN  No problem-specific assessment & plan notes found for this encounter.   Follow up: Routine or as needed  CBC, BMP pending today  Kaisha Wachob T.Kendrew Paci, NP-C 12/27/2012

## 2012-12-27 NOTE — Assessment & Plan Note (Signed)
Followup Dr. Ermelinda Das office 12/14/2012, skin staples were removed. Recommended continuing left knee immobilizer while in bed to prevent dislocations, continue other total hip precautions. Continue partial weightbearing left leg. Return for repeat x-rays in 4 weeks. This appointment has been arranged. Patient is recovering slowly from his left hip fracture. Working with and making some progress with PT and OT. Pain is well managed with current medication.

## 2012-12-28 ENCOUNTER — Non-Acute Institutional Stay (SKILLED_NURSING_FACILITY): Payer: Medicare Other | Admitting: Geriatric Medicine

## 2012-12-28 DIAGNOSIS — F411 Generalized anxiety disorder: Secondary | ICD-10-CM

## 2012-12-28 DIAGNOSIS — D509 Iron deficiency anemia, unspecified: Secondary | ICD-10-CM

## 2012-12-28 NOTE — Progress Notes (Signed)
Patient ID: George Kemp, male   DOB: 1926/10/01, 79 y.o.   MRN: [id number] [facility] [phone])  Code Status: DNR, MOST form Contact Information   Name Relation Home Work Mobile   [place] Daughter [phone]  [phone]   [place], Hutzler [phone]  [phone]      Chief Complaint  Patient presents with  . Anemia    HPI: This is a 79 y.o. male resident of [facility].  Evaluation is requested today due to anemaia.   Nursing assessment reveals patient appears pallid, "it is just not right", exhibiting signs of increased anxiety. Lab results return today with hemoglobin with further drop. Stool guaiac has been requested, not completed at this time.      No Known Allergies Medications Reviewed  DATA REVIEWED  Radiologic Exams:   Cardiovascular Exams:   Laboratory Studies:  [facility], external                          12/04/2012 WBC 7.0, hemoglobin 7.6, hematocrit 22.5, platelet 118                                       Glucose 145, BUN 28, creatinine 1.38, sodium 144, potassium 4.3                           12/06/2012: WBC 6.7, hemoglobin 7.0, hematocrit 20.7, platelets 179                                                  12/10/2012 WBC 9.1, hemoglobin 9.7, hematocrit 20.9, platelet 378                                     Glucose 139, BUN 23, creatinine 1.25, sodium 146, potassium 4.3                         12/18/2012 WBC 7.1, hemoglobin 9.5, hematocrit 28.0, platelets 479   12/28/2012 WBC 5.2, hemoglobin 9.0, hematocrit 28.1, platelets 224  RBC 3.10, MCV 90.6, MCH 29.0      Review of Systems  DATA OBTAINED: from patient, nurse, medical record,   GENERAL: Feels "OK" No fevers, change in appetite or weight.   SKIN: No itch,   EYES: No eye pain, dryness or itching   EARS: No earache, tinnitus, change in hearing   NOSE: No congestion, drainage or bleeding   MOUTH/THROAT: No mouth or tooth pain No  sore throat No difficulty chewing or swallowing   RESPIRATORY: No cough, wheezing, SOB   CARDIAC: No chest pain, palpitations No edema.   GI: No abdominal pain No N/V/D No heartburn or reflux   GU: Frequently Incontinent, no dysuria MUSCULOSKELETAL: No left hip pain, No back pain No muscle ache, pain, weakness   NEUROLOGIC: No dizziness, fainting, headache No change in mental status (dementia).   PSYCHIATRIC:  Sleeps well. No behavior issue. Some depressive symptoms   Physical Exam Filed Vitals:   12/28/12 2021  BP: 115/59  Pulse: 92  Temp: 98.3 F (36.8 C)  Resp: 24  Weight: 155 lb 6.4 oz (70.489 kg)  SpO2: 95%   Body mass index is 21.68 kg/(m^2). GENERAL APPEARANCE: No acute distress, appropriately groomed, normal body habitus. Alert, pleasant, conversant.   SKIN: No diaphoresis Left patella with healing stage I pressure injury, unchanged.   HEAD: Normocephalic, atraumatic   EYES: Conjunctiva/lids clear. Pupils round, reactive.   EARS: Decreased Hearing  NOSE: No deformity or discharge.   MOUTH/THROAT: Lips w/o lesions. Oral mucosa, tongue moist, w/o lesion. Oropharynx w/o redness or lesions.   NECK: Supple, full ROM. No thyroid tenderness, enlargement or nodule   LYMPHATICS: No head, neck or supraclavicular adenopathy   RESPIRATORY: Breathing is even, unlabored. Lungs sounds clear and full throughout   CARDIOVASCULAR: Heart RRR. No murmur or extra heart sounds               EDEMA: No peripheral or periorbital edema.   GASTROINTESTINAL: Abdomen is soft, non-tender, distended w/ normal bowel sounds. No CVA tenderness   GENITOURINARY: Bladder non tender, not distended.   MUSCULOSKELETAL: Moves UE with full ROM, strength and tone. Back is without kyphosis, scoliosis or spinal process tenderness. Left hip incision healed. No surrounding erythema, edema or ecchymosis. Left leg/foot with no edema.   Knee immobilizer replaced after exam.  PSYCHIATRIC: Mood and affect appropriate to  situation  ASSESSMENT/PLAN  Anemia, iron deficiency Patient is status post transfusion 4 treatment of postoperative anemia. His hemoglobin improved from 7-9.7 after 2 units packed red blood cells. His hemoglobin has continued to drift down since he was restarted on antiplatelet treatment, Plavix, day after the transfusion. Question GI source of anemia with low red blood cell count as well as MCV and MCH. Stool guaiac today is pending. Stop Plavix.   Anxiety state, unspecified Patient with increased anxiety this afternoon. He is not confused though feels like he is out of control. Patient has a history of bipolar disorder, treated with Seroquel and Anafranil. Increased anxiety today could be related to this problem /or  anemia. Would like to avoid over medicating this gentleman. Will ask the nurse to give him his evening Seroquel now and continue to monitor.     Follow up: As needed  Jeralyn Nolden T.Sanford Lindblad, NP-C 12/28/2012

## 2012-12-30 ENCOUNTER — Encounter: Payer: Self-pay | Admitting: Geriatric Medicine

## 2012-12-30 DIAGNOSIS — D509 Iron deficiency anemia, unspecified: Secondary | ICD-10-CM | POA: Insufficient documentation

## 2012-12-30 DIAGNOSIS — F411 Generalized anxiety disorder: Secondary | ICD-10-CM | POA: Insufficient documentation

## 2012-12-30 HISTORY — DX: Iron deficiency anemia, unspecified: D50.9

## 2012-12-30 NOTE — Assessment & Plan Note (Signed)
Patient with increased anxiety this afternoon. He is not confused though feels like he is out of control. Patient has a history of bipolar disorder, treated with Seroquel and Anafranil. Increased anxiety today could be related to this problem /or  anemia. Would like to avoid over medicating this gentleman. Will ask the nurse to give him his evening Seroquel now and continue to monitor.

## 2012-12-30 NOTE — Assessment & Plan Note (Signed)
Patient is status post transfusion 4 treatment of postoperative anemia. His hemoglobin improved from 7-9.7 after 2 units packed red blood cells. His hemoglobin has continued to drift down since he was restarted on antiplatelet treatment, Plavix, day after the transfusion. Question GI source of anemia with low red blood cell count as well as MCV and MCH. Stool guaiac today is pending. Stop Plavix.

## 2013-01-17 LAB — TSH: TSH: 0.42 u[IU]/mL (ref <=5.90)

## 2013-03-25 ENCOUNTER — Encounter: Payer: Self-pay | Admitting: Geriatric Medicine

## 2013-03-25 ENCOUNTER — Non-Acute Institutional Stay (SKILLED_NURSING_FACILITY): Payer: Medicare Other | Admitting: Geriatric Medicine

## 2013-03-25 DIAGNOSIS — F319 Bipolar disorder, unspecified: Secondary | ICD-10-CM

## 2013-03-25 DIAGNOSIS — N289 Disorder of kidney and ureter, unspecified: Secondary | ICD-10-CM

## 2013-03-25 DIAGNOSIS — S72002D Fracture of unspecified part of neck of left femur, subsequent encounter for closed fracture with routine healing: Secondary | ICD-10-CM

## 2013-03-25 DIAGNOSIS — D62 Acute posthemorrhagic anemia: Secondary | ICD-10-CM

## 2013-03-25 DIAGNOSIS — E119 Type 2 diabetes mellitus without complications: Secondary | ICD-10-CM

## 2013-03-25 DIAGNOSIS — S72009D Fracture of unspecified part of neck of unspecified femur, subsequent encounter for closed fracture with routine healing: Secondary | ICD-10-CM

## 2013-03-25 NOTE — Progress Notes (Signed)
Patient ID: George Kemp, male   DOB: [birth year], 79 y.o.   MRN: [id number] [facility] [phone])  Code Status: DNR, MOST form Contact Information   Name Relation Home Work Mobile   [place] Daughter [phone]  [phone]   George Kemp, George Kemp [phone]  [phone]      Chief Complaint  Patient presents with  . Medical Managment of Chronic Issues    HPI: This is a 79 y.o. male resident of [facility], Skilled Nursing section evaluated today for management of ongoing medical issues.    Recent visits Anemia, iron deficiency Patient is status post transfusion 4 transfusions after postoperative anemia. His hemoglobin improved from 7-9.7 after 2 units packed red blood cells. His hemoglobin has continued to drift down since he was restarted on antiplatelet treatment, Plavix, day after the transfusion. Question GI source of anemia with low red blood cell count as well as MCV and MCH. Stool guaiac today is pending. Stop Plavix.  Anxiety state, unspecified Patient with increased anxiety this afternoon. He is not confused though feels like he is out of control. Patient has a history of bipolar disorder, treated with Seroquel and Anafranil. Increased anxiety today could be related to this problem /or  anemia. Would like to avoid over medicating this gentleman. Will ask the nurse to give him his evening Seroquel now and continue to monitor.   Closed left hip fracture    Followup Dr. Ermelinda Das office 12/14/2012, skin staples were removed. Recommended continuing left knee immobilizer while in bed to prevent dislocations, continue other total hip precautions. Continue partial weightbearing left leg. Return for repeat x-rays in 4 weeks. This appointment has been arranged. Patient is recovering slowly from his left hip fracture. Working with and making some progress with PT and OT. Pain is well managed with current medication   Bipolar disorder,  unspecified     Mild depressive symptoms evident since patient's hip fracture.. Patient's mood improves when he is able to be out of bed and out of his room. We'll continue to engage him in some usual activities, continue to monitor mood and behavior. No medication adjustment at this time   Review of record shows Vital signs stable, weight im roved, PO intake >50% at most meals. Urine output/BMs satisfactory. Fasting CBGs satisfactory (<150) Most recent lab with low but stable H/H/.  Nursing reports pt's mood is good; participates in activities of choice, spouse visits daily, other family visits frequently. No behavior issues. No significant anxiety Therapy reports patient is progressing; walking short distances during sessions. Most recent visit with Dr. Shelle Iron 01/14/13, pt released to [facility], continue PT for gait training/apprpriate assistive device.    No Known Allergies Medications Reviewed  DATA REVIEWED  Radiologic Exams:   Cardiovascular Exams:   Laboratory Studies:  [facility], external                          12/04/2012 WBC 7.0, hemoglobin 7.6, hematocrit 22.5, platelet 118                                       Glucose 145, BUN 28, creatinine 1.38, sodium 144, potassium 4.3                           12/06/2012: WBC 6.7, hemoglobin 7.0, hematocrit 20.7, platelets 179  12/10/2012 WBC 9.1, hemoglobin 9.7, hematocrit 20.9, platelet 378                                     Glucose 139, BUN 23, creatinine 1.25, sodium 146, potassium 4.3                         12/18/2012 WBC 7.1, hemoglobin 9.5, hematocrit 28.0, platelets 479   12/28/2012 WBC 5.2, hemoglobin 9.0, hematocrit 28.1, platelets 224  RBC 3.10, MCV 90.6, MCH 29.0 01/17/2013 WBC 6.1, hemoglobin 9.3, hematocrit 28.5, platelets 248  Cholesterol 141, triglyceride 105, HDL 36, LDL 84  TSH 0.419  Iron 43  A1c 6.4      Review of Systems  DATA OBTAINED: from patient, nurse, medical  record,   GENERAL: Feels "OK" No fevers, change in appetite or weight.   SKIN: No itch,   EYES: No eye pain, dryness or itching  Poor vision "I would like to get my cataracts fixed" EARS: No earache, tinnitus, diminished hearing   NOSE: No congestion, drainage or bleeding   MOUTH/THROAT: No mouth or tooth pain No sore throat No difficulty chewing or swallowing   RESPIRATORY: No cough, wheezing, SOB   CARDIAC: No chest pain, palpitations No edema.   GI: No abdominal pain No N/V/D No heartburn or reflux   GU: Frequently Incontinent, no dysuria MUSCULOSKELETAL: No left hip pain, No back pain No muscle ache, pain, weakness   NEUROLOGIC: No dizziness, fainting, headache No change in mental status (dementia).   PSYCHIATRIC:  Sleeps well. No behavior issue. No recent anxiety or depressive symptoms   Physical Exam Filed Vitals:   03/25/13 1606  BP: 119/70  Pulse: 84  Temp: 97.4 F (36.3 C)  Resp: 20  Weight: 162 lb 9.6 oz (73.755 kg)  SpO2: 96%   Body mass index is 22.69 kg/(m^2).  GENERAL APPEARANCE: No acute distress, appropriately groomed, normal body habitus. Alert, pleasant, conversant.   SKIN: No diaphoresis , rash or wound   HEAD: Normocephalic, atraumatic   EYES: Conjunctiva/lids clear. Pupils round, reactive.   EARS: Decreased Hearing  NOSE: No deformity or discharge.   MOUTH/THROAT: Lips w/o lesions. Oral mucosa, tongue moist, w/o lesion. Oropharynx w/o redness or lesions.   NECK: Supple, full ROM. No thyroid tenderness, enlargement or nodule   LYMPHATICS: No head, neck or supraclavicular adenopathy   RESPIRATORY: Breathing is even, unlabored. Lungs sounds clear and full throughout   CARDIOVASCULAR: Heart RRR. No murmur or extra heart sounds               EDEMA: No peripheral or periorbital edema.   GASTROINTESTINAL: Abdomen is soft, non-tender, distended w/ normal bowel sounds.   MUSCULOSKELETAL: Moves Rt. UE/LE with full ROM, strength and tone. Back is without kyphosis,  scoliosis or spinal process tenderness.  NEUROLOGIC: Left hemiparesis, left leg w/ AFO PSYCHIATRIC: Mood and affect appropriate to situation  ASSESSMENT/PLAN  Closed left hip fracture Fracture healed per last Ortho visit; pt. Is WBAT. Continues to make slow progress with PT. Left hemiparesis limits ambulation  Diabetes Remains well controlled, no medication required. Follow A1C at intervals  Postoperative anemia due to acute blood loss Most recent H/H stable, remains off antiplatelet medication. Update   Bipolar disorder, unspecified Depressed mood has lifted with improved mobility status( can maneuver manual WC himself). No recent anxiety either. Continue current medication.   CKD (chronic  kidney disease), stage II Update lab   Follow up: Routine and as needed  Lab 10/28: CBC, BMP  Liya Strollo T.Matha Masse, NP-C  03/25/2013

## 2013-03-26 LAB — CBC AND DIFFERENTIAL
Platelets: 194 10*3/uL (ref 150–399)
WBC: 5.4 10^3/mL

## 2013-03-26 LAB — BASIC METABOLIC PANEL
BUN: 21 mg/dL (ref 4–21)
Creatinine: 1.3 mg/dL (ref 0.6–1.3)

## 2013-03-29 ENCOUNTER — Encounter: Payer: Self-pay | Admitting: Geriatric Medicine

## 2013-03-29 NOTE — Assessment & Plan Note (Addendum)
Remains well controlled, no medication required. Follow A1C at intervals

## 2013-03-29 NOTE — Assessment & Plan Note (Signed)
Most recent H/H stable, remains off antiplatelet medication. Update

## 2013-03-29 NOTE — Assessment & Plan Note (Signed)
Update lab 

## 2013-03-29 NOTE — Assessment & Plan Note (Signed)
Fracture healed per last Ortho visit; pt. Is WBAT. Continues to make slow progress with PT. Left hemiparesis limits ambulation

## 2013-03-29 NOTE — Assessment & Plan Note (Signed)
Depressed mood has lifted with improved mobility status( can maneuver manual WC himself). No recent anxiety either. Continue current medication.

## 2013-05-13 ENCOUNTER — Encounter: Payer: Self-pay | Admitting: Geriatric Medicine

## 2013-05-13 ENCOUNTER — Non-Acute Institutional Stay (SKILLED_NURSING_FACILITY): Payer: Medicare Other | Admitting: Geriatric Medicine

## 2013-05-13 DIAGNOSIS — F0390 Unspecified dementia without behavioral disturbance: Secondary | ICD-10-CM

## 2013-05-13 DIAGNOSIS — N182 Chronic kidney disease, stage 2 (mild): Secondary | ICD-10-CM

## 2013-05-13 DIAGNOSIS — I10 Essential (primary) hypertension: Secondary | ICD-10-CM

## 2013-05-13 DIAGNOSIS — E119 Type 2 diabetes mellitus without complications: Secondary | ICD-10-CM

## 2013-05-13 DIAGNOSIS — D509 Iron deficiency anemia, unspecified: Secondary | ICD-10-CM

## 2013-05-13 NOTE — Assessment & Plan Note (Signed)
Renal function stable.

## 2013-05-13 NOTE — Assessment & Plan Note (Signed)
Reviewed MDS 05/03/2013: BIMS 8/15, PHQ-9 0/27. No mood or behavior issues identified. Functional status: Extensive assist with all ADLs except eating, frequently incontinent of bowel and bladder. Verbal skills intact, participates in activities, visits with spouse daily, family remains involved. Continue medication.

## 2013-05-13 NOTE — Assessment & Plan Note (Signed)
Remains well controlled off medication

## 2013-05-13 NOTE — Progress Notes (Signed)
Patient ID: George Kemp, male   DOB: May 23, 1927, 79 y.o.   MRN: [id number]  [facility] SN[facility] [phone])  Code Status: DNR, MOST form Contact Information   Name Relation Home Work Mobile   George Kemp Daughter [phone]  [phone]   George Kemp, George Kemp [phone]  [phone]      Chief Complaint  Patient presents with  . Medical Managment of Chronic Issues    HPI: This is a 79 y.o. male resident of [facility], Skilled Nursing section evaluated today for management of ongoing medical issues.    Last visit: Closed left hip fracture Fracture healed per last Ortho visit; pt. Is WBAT. Continues to make slow progress with PT. Left hemiparesis limits ambulation  Diabetes Remains well controlled, no medication required. Follow A1C at intervals  Postoperative anemia due to acute blood loss Most recent H/H stable, remains off antiplatelet medication. Update   Bipolar disorder, unspecified Depressed mood has lifted with improved mobility status( can maneuver manual WC himself). No recent anxiety either. Continue current medication.   CKD (chronic kidney disease), stage II Update lab   Since last visit patient has remained stable, no acute medical issues. Most recent lab shows improvement in anemia, renal function stable. Patient's only complaint is poor vision. Review of facility record shows metoprolol is frequently held due to low HR. CBGs satisfactory at <150. Weight has been stable, eating >50% at most meals.  Most recent MDS 05/03/2013 shows declined cognitive and functional status from baseline (prior to hip fracture). He is participating in activities, no mood or behavior issues.     No Known Allergies Medications Reviewed  DATA REVIEWED  Radiologic Exams:   Cardiovascular Exams:   Laboratory Studies:  [facility], external                          12/04/2012 WBC 7.0, hemoglobin 7.6, hematocrit 22.5, platelet 118                            Glucose 145, BUN 28, creatinine 1.38, sodium 144, potassium 4.3                           12/06/2012: WBC 6.7, hemoglobin 7.0, hematocrit 20.7, platelets 179                                                  12/10/2012 WBC 9.1, hemoglobin 9.7, hematocrit 20.9, platelet 378                                     Glucose 139, BUN 23, creatinine 1.25, sodium 146, potassium 4.3                         12/18/2012 WBC 7.1, hemoglobin 9.5, hematocrit 28.0, platelets 479   12/28/2012 WBC 5.2, hemoglobin 9.0, hematocrit 28.1, platelets 224  RBC 3.10, MCV 90.6, MCH 29.0 01/17/2013 WBC 6.1, hemoglobin 9.3, hematocrit 28.5, platelets 248  Cholesterol 141, triglyceride 105, HDL 36, LDL 84  TSH 0.419  Iron 43  A1c 6.4     Lab Results- [facility] 03/26/2013  Component Value   WBC 5.4  HGB 10.1*   HCT 30*   PLT 194       GLUCOSE 116   NA 142   K 3.9   CREATININE 1.3   BUN 21     Review of Systems  DATA OBTAINED: from patient, nurse, medical record,   GENERAL: Feels well. No fevers, change in appetite or weight.   SKIN: No itch,  rash EYES: No eye pain, dryness or itching  Poor vision "I would like to get my cataracts fixed" EARS: No earache, tinnitus, diminished hearing   NOSE: No congestion, drainage or bleeding   MOUTH/THROAT: No mouth or tooth pain     No sore throat     No difficulty chewing or swallowing   RESPIRATORY: No cough, wheezing, SOB   CARDIAC: No chest pain, palpitations No edema.   GI: No abdominal pain No N/V/D No heartburn or reflux  Frequently incontinent GU: Frequently Incontinent, no dysuria MUSCULOSKELETAL: No left hip pain, No back pain     No muscle ache, pain, weakness   NEUROLOGIC: No dizziness, fainting, headache No change in mental status (dementia). Left hemiplegia PSYCHIATRIC:  Sleeps well. No behavior issue. No recent anxiety or depressive symptoms   Physical Exam Filed Vitals:   05/13/13 1512  BP: 122/64  Pulse: 79  Temp: 97.8 F  (36.6 C)  Resp: 20  Weight: 162 lb (73.483 kg)  SpO2: 96%   Body mass index is 22.6 kg/(m^2).  GENERAL APPEARANCE: No acute distress, appropriately groomed, normal body habitus. Alert, pleasant, conversant.   SKIN: No diaphoresis , rash or wound   HEAD: Normocephalic, atraumatic   EYES: Conjunctiva/lids clear. Pupils round, reactive.   EARS: Decreased Hearing  NOSE: No deformity or discharge.   MOUTH/THROAT: Lips w/o lesions. Oral mucosa, tongue moist, w/o lesion. Oropharynx w/o redness or lesions.   NECK: Supple, full ROM. No thyroid tenderness, enlargement or nodule   LYMPHATICS: No head, neck or supraclavicular adenopathy   RESPIRATORY: Breathing is even, unlabored. Lungs sounds clear and full throughout   CARDIOVASCULAR: Heart RRR. No murmur or extra heart sounds               EDEMA: No peripheral or periorbital edema.   GASTROINTESTINAL: Abdomen is soft, non-tender, distended w/ normal bowel sounds.   MUSCULOSKELETAL: Moves Rt. UE/LE with full ROM, strength and tone. Back is without kyphosis, scoliosis or spinal process tenderness.  NEUROLOGIC: Left hemiparesis,   PSYCHIATRIC: Mood and affect appropriate to situation  ASSESSMENT/PLAN  Dementia Reviewed MDS 05/03/2013: BIMS 8/15, PHQ-9 0/27. No mood or behavior issues identified. Functional status: Extensive assist with all ADLs except eating, frequently incontinent of bowel and bladder. Verbal skills intact, participates in activities, visits with spouse daily, family remains involved. Continue medication.   HTN (hypertension) Recent P readins <90, metoprolol has been held often, BP stable.. Decrease metoprolol to 12.5 BID  Diabetes Remains well controlled off medication  CKD (chronic kidney disease), stage II Renal function stable  Anemia, iron deficiency Most recent CBC improved, H/H remained below desired range. PO intake has improved, no further bleeding, remains off antiplatelet medication. Receheck CBC end of  January   Follow up: Routine and as needed  Lab 06/27/2013 CBC, CMP, Lipid panel, A1C, TSH   George Sitar T.Latiqua Daloia, NP-C 05/13/2013

## 2013-05-13 NOTE — Assessment & Plan Note (Signed)
Recent P readins <90, metoprolol has been held often, BP stable.. Decrease metoprolol to 12.5 BID

## 2013-05-13 NOTE — Assessment & Plan Note (Signed)
Most recent CBC improved, H/H remained below desired range. PO intake has improved, no further bleeding, remains off antiplatelet medication. Receheck CBC end of January

## 2013-06-27 LAB — CBC AND DIFFERENTIAL
HEMATOCRIT: 34 % — AB (ref 41–53)
HEMOGLOBIN: 11.5 g/dL — AB (ref 13.5–17.5)
Platelets: 176 10*3/uL (ref 150–399)
WBC: 6.3 10^3/mL

## 2013-06-27 LAB — HEMOGLOBIN A1C: Hgb A1c MFr Bld: 7.1 % — AB (ref 4.0–6.0)

## 2013-06-27 LAB — TSH: TSH: 1.03 u[IU]/mL (ref 0.41–5.90)

## 2013-06-27 LAB — BASIC METABOLIC PANEL
BUN: 25 mg/dL — AB (ref 4–21)
Creatinine: 1.2 mg/dL (ref 0.6–1.3)
GLUCOSE: 137 mg/dL
POTASSIUM: 4.2 mmol/L (ref 3.4–5.3)
SODIUM: 144 mmol/L (ref 137–147)

## 2013-06-27 LAB — LIPID PANEL
Cholesterol: 152 mg/dL (ref 0–200)
HDL: 55 mg/dL (ref 35–70)
LDL Cholesterol: 78 mg/dL
LDL/HDL RATIO: 2.8
Triglycerides: 83 mg/dL (ref 40–160)

## 2013-07-26 ENCOUNTER — Encounter: Payer: Self-pay | Admitting: Adult Health

## 2013-07-26 ENCOUNTER — Non-Acute Institutional Stay (SKILLED_NURSING_FACILITY): Payer: Medicare Other | Admitting: Adult Health

## 2013-07-26 DIAGNOSIS — H25019 Cortical age-related cataract, unspecified eye: Secondary | ICD-10-CM

## 2013-07-26 DIAGNOSIS — N182 Chronic kidney disease, stage 2 (mild): Secondary | ICD-10-CM

## 2013-07-26 DIAGNOSIS — D509 Iron deficiency anemia, unspecified: Secondary | ICD-10-CM

## 2013-07-26 DIAGNOSIS — F0390 Unspecified dementia without behavioral disturbance: Secondary | ICD-10-CM

## 2013-07-26 NOTE — Assessment & Plan Note (Signed)
hbg and hct are improved with Jan labs. Will follow at regular interval. No new orders

## 2013-07-26 NOTE — Assessment & Plan Note (Signed)
A1c at 7.1. This is ok. Will follow. No new treatment needed at this time

## 2013-07-26 NOTE — Progress Notes (Signed)
Patient ID: George Kemp, male   DOB: [birth year], 80 y.o.   MRN: [id number]   [facility] [phone])  Code Status: DNR  Contact Information   Name Relation Home Work Mobile   [place] Daughter [phone]  [phone]   George Kemp, George Kemp [phone]  [phone]       Chief Complaint  Patient presents with  . Medical Managment of Chronic Issues  . Anemia  . Diabetes  . Dementia  . Chronic Kidney Disease    HPI: This is a 80 y.o. male resident of [facility], Skilled Nursing section evaluated today for management of ongoing medical issues.    Last Visit 05/2013 Dementia  Reviewed MDS 05/03/2013: BIMS 8/15, PHQ-9 0/27. No mood or behavior issues identified. Functional status: Extensive assist with all ADLs except eating, frequently incontinent of bowel and bladder. Verbal skills intact, participates in activities, visits with spouse daily, family remains involved. Continue medication.  HTN (hypertension)  Recent P readins <90, metoprolol has been held often, BP stable.. Decrease metoprolol to 12.5 BID  Diabetes  Remains well controlled off medication  CKD (chronic kidney disease), stage II  Renal function stable  Anemia, iron deficiency  Most recent CBC improved, H/H remained below desired range. PO intake has improved, no further bleeding, remains off antiplatelet medication. Receheck CBC end of January   Since last visit, patient has not had any acute medical issues. Review of facility record shows vital signs stable, specifically pulse range has been 80-90, Patient has had a several pound weight gain last few months, CBGs remain satisfactory.  January labs all satisfactory.  Had left cataract surgery 07/04/13, uneventful, vision improved. Right eye was scheduled yesterday however was canceled and will need to be rescheduled do to inclement weather.   No Known Allergies  MEDICATIONS -  Reviewed. Atorvastatin d/c in after January  lipid panel. No longer indicatied.  DATA REVIEWED  Laboratory Studies: Lab Results  Component Value Date   WBC 6.3 06/27/2013   HGB 11.5* 06/27/2013   HCT 34* 06/27/2013   MCV 95.1 12/06/2012   PLT 176 06/27/2013    Lab Results  Component Value Date   NA 144 06/27/2013   K 4.2 06/27/2013   GLU 137 06/27/2013   BUN 25* 06/27/2013   CREATININE 1.2 06/27/2013    Lab Results  Component Value Date   HGBA1C 7.1* 06/27/2013   Lab Results  Component Value Date   CHOL 152 06/27/2013   HDL 55 06/27/2013   LDLCALC 78 06/27/2013   TRIG 83 06/27/2013   Lab Results  Component Value Date   TSH 1.03 06/27/2013    REVIEW OF SYSTEMS  DATA OBTAINED: from patient, nurse, medical record GENERAL: Feels well  No recent fever, fatigue, change in activity status, appetite, or weight  EYES: recovering well from left cataract removal, vision improved RESPIRATORY: No cough, wheezing, SOB CARDIAC: No chest pain, palpitations. No edema GI: No abdominal pain  No Nausea,vomiting,diarrhea or constipation  No heartburn or reflux. Frequent bowel incontinence GU: Frequent incontinence urine  MUSCULOSKELETAL: No joint pain, No muscle ache, pain, weakness. No recent falls. Wheelchair, nonambulatory, left hemiparesis, lift for transfers NEUROLOGIC: No dizziness, fainting, headache, numbness. Dementia unchanged PSYCHIATRIC: No feelings of anxiety, depression  Sleeps well. Is feeling his mood is much better with vision improvement r/t cataract surgery  PHYSICAL EXAM Filed Vitals:   07/26/13 1432  BP: 109/67  Pulse: 90  Temp: 96.9 F (36.1 C)  Resp: 20  Weight: 164 lb (  74.39 kg)  SpO2: 95%   Body mass index is 22.88 kg/(m^2).  GENERAL APPEARANCE: No acute distress, appropriately groomed, normal body habitus Alert, pleasant, conversant. SKIN: No diaphoresis, rash, wound HEAD: Normocephalic, atraumatic EYES: Conjunctiva/lids clear, PERRL, Left eye clear, cataract visible rt eye  RESPIRATORY: Breathing is  even, unlabored  Lung sounds are clear and full  CARDIOVASCULAR: Heart RRR   No murmur or extra heart sounds, Bilateral radial and pedal pulses                         intact   EDEMA: No peripheral edema  GI: soft, nontender, positive bowel sounds 4 quadrants MUSCULOSKELETAL: moves rt upper and lower extremity well.  Sensation present on left, no movement from previous CVA, nontender PSYCHIATRIC: Mood and affect appropriate to situation, speech is clear, follows commands and answers questions and follows commands appropriately. Dementia   ASSESSMENT/PLAN    D/c Hydrocodone/APAP 5/325mg  PO BID for pain due to no use since July 2014  CKD (chronic kidney disease), stage II CKD stable. Slight bump in BUN from 21-25. 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 This is ok. Will follow. No new treatment needed at this time  Dementia MDS in progress for this quarter. Pts mood has significantly improved since left cataract removed. He is looking forward to having the other eye done on 08/08/13. Participates in many activities  Anemia, iron deficiency hbg and hct are improved with Jan labs. Will follow at regular interval. No new orders  Cataract cortical, senile Status post uneventful left cataract extraction/ intraocular lens implant 07/04/13. Will have rt cataract removed 08/08/13.  Excellent mood with improved vision    Follow up: Routine follow up or as needed  Lemya Greenwell T.Wandell Scullion, NP-C/Stephanie Mancel [employer] student [facility] [phone]  07/26/2013

## 2013-07-26 NOTE — Assessment & Plan Note (Signed)
MDS in progress for this quarter. Pts mood has significantly improved since left cataract removed. He is looking forward to having the other eye done on 08/08/13. Participates in many activities

## 2013-07-26 NOTE — Assessment & Plan Note (Signed)
CKD stable. Slight bump in BUN from 21-25. Will continue to monitor regular interval

## 2013-07-28 ENCOUNTER — Other Ambulatory Visit: Payer: Self-pay | Admitting: Geriatric Medicine

## 2013-07-28 DIAGNOSIS — H25019 Cortical age-related cataract, unspecified eye: Secondary | ICD-10-CM | POA: Insufficient documentation

## 2013-07-28 NOTE — Assessment & Plan Note (Signed)
Status post uneventful left cataract extraction/ intraocular lens implant 07/04/13. Will have rt cataract removed 08/08/13.  Excellent mood with improved vision

## 2013-09-09 ENCOUNTER — Encounter: Payer: Self-pay | Admitting: Geriatric Medicine

## 2013-09-09 ENCOUNTER — Non-Acute Institutional Stay (SKILLED_NURSING_FACILITY): Payer: Medicare Other | Admitting: Geriatric Medicine

## 2013-09-09 DIAGNOSIS — E1129 Type 2 diabetes mellitus with other diabetic kidney complication: Secondary | ICD-10-CM

## 2013-09-09 DIAGNOSIS — J301 Allergic rhinitis due to pollen: Secondary | ICD-10-CM

## 2013-09-09 DIAGNOSIS — F0390 Unspecified dementia without behavioral disturbance: Secondary | ICD-10-CM

## 2013-09-09 DIAGNOSIS — I6990 Unspecified sequelae of unspecified cerebrovascular disease: Secondary | ICD-10-CM

## 2013-09-09 DIAGNOSIS — I10 Essential (primary) hypertension: Secondary | ICD-10-CM

## 2013-09-09 MED ORDER — LORATADINE 10 MG PO TABS: 10.0000 mg | ORAL_TABLET | Freq: Every day | ORAL | Status: DC

## 2013-09-09 NOTE — Assessment & Plan Note (Addendum)
Fasting CBG range remains satisfactory, 123-158. No medication required, monitor A1c at intervals

## 2013-09-09 NOTE — Assessment & Plan Note (Signed)
Recent blood pressure range satisfactory, 109 and 132/6472, pulse rate 79-99. Continue current medication

## 2013-09-09 NOTE — Assessment & Plan Note (Signed)
Most recent MDS review 07/2013: BIMS 10/15, PHQ-9 0/27. No mood or behavior issues identified. Functional status: Extensive assist with all ADLs except eating, frequently incontinent of bowel and bladder. Verbal skills intact, participates in activities, visits with spouse daily, family remains involved. Continue medication.

## 2013-09-09 NOTE — Assessment & Plan Note (Signed)
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 He's not having any pain or abnormal movements of his left upper or lower extremities. He is able to assist with mobilization in a manual wheelchair

## 2013-09-09 NOTE — Progress Notes (Signed)
Patient ID: George Kemp, male   DOB: 12/14/1926, 80 y.o.   MRN: [id number]   [facility] SN[facility] [phone])  Code Status: DNR  Contact Information   Name Relation Home Work Mobile   [place] Daughter [phone]  [phone]   [place], Najarian [phone]  [phone]       Chief Complaint  Patient presents with  . Medical Managment of Chronic Issues    HPI: This is a 80 y.o. male resident of [facility], Skilled Nursing section evaluated today for management of ongoing medical issues.    Last Visit  CKD (chronic kidney disease), stage II CKD stable. Slight bump in BUN from 21-25. 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 This is ok. Will follow. No new treatment needed at this time  Dementia MDS in progress for this quarter. Pts mood has significantly improved since left cataract removed. He is looking forward to having the other eye done on 08/08/13. Participates in many activities  Anemia, iron deficiency hbg and hct are improved with Jan labs. Will follow at regular interval. No new orders  Cataract cortical, senile Status post uneventful left cataract extraction/ intraocular lens implant 07/04/13. Will have rt cataract removed 08/08/13.  Excellent mood with improved vision  Since last visit patient has had not had any acute medical issues. He underwent right cataract extraction and intraocular lens implant on March 12 as scheduled. He has recovered from this procedure without complication. Reports his vision is very good. Review of facility record shows patient's vital signs have been stable, he has had a 5 pound weight gain in last 2 months.  Fasting blood sugars have been satisfactory. There's been no reported change in his cognitive or functional status Patient's only complaint today is scratchy throat, dry cough, stuffy nose. He has been receiving loratadine on a p.r.n.  basis for the last several days.   No Known Allergies  MEDICATIONS -  Reviewed.   DATA REVIEWED  Laboratory Studies: Lab Results  Component Value Date   WBC 6.3 06/27/2013   HGB 11.5* 06/27/2013   HCT 34* 06/27/2013   MCV 95.1 12/06/2012   PLT 176 06/27/2013    Lab Results  Component Value Date   NA 144 06/27/2013   K 4.2 06/27/2013   GLU 137 06/27/2013   BUN 25* 06/27/2013   CREATININE 1.2 06/27/2013    Lab Results  Component Value Date   HGBA1C 7.1* 06/27/2013   Lab Results  Component Value Date   CHOL 152 06/27/2013   HDL 55 06/27/2013   LDLCALC 78 06/27/2013   TRIG 83 06/27/2013   Lab Results  Component Value Date   TSH 1.03 06/27/2013    REVIEW OF SYSTEMS  DATA OBTAINED: from patient, nurse, medical record GENERAL: Feels well  No recent fever, fatigue, change in activity status, appetite, or weight  EYES:  vision improved after bilateral cataract surgery. "It's like a shade has been lifted, I see so clearly now..." RESPIRATORY: Dry cough, stuffy nose  No wheezing, SOB CARDIAC: No chest pain, palpitations. No edema GI: No abdominal pain  No Nausea,vomiting,diarrhea or constipation  No heartburn or reflux. Frequent bowel incontinence GU: Frequent incontinence urine  MUSCULOSKELETAL: No joint pain, No muscle ache, pain, weakness. No recent falls. Wheelchair, nonambulatory, left hemiparesis, lift for transfers NEUROLOGIC: No dizziness, fainting, headache, numbness. Dementia unchanged PSYCHIATRIC: No feelings of anxiety, depression  Sleeps well.   PHYSICAL EXAM Filed Vitals:   09/09/13  1542  BP: 132/76  Pulse: 99  Temp: 98.4 F (36.9 C)  Weight: 169 lb 9.6 oz (76.93 kg)  SpO2: 98%   Body mass index is 23.66 kg/(m^2).  GENERAL APPEARANCE: No acute distress, appropriately groomed, normal body habitus Alert, pleasant, conversant. SKIN: No diaphoresis, rash, wound HEAD: Normocephalic, atraumatic EYES: Conjunctiva/lids clear,  IOL bilateral  RESPIRATORY: Breathing is  even, unlabored  Lung sounds are clear and full. Dry cough during exam, voice is raspy CARDIOVASCULAR: Heart RRR   No murmur or extra heart sounds, Bilateral radial and pedal pulses                           EDEMA: No peripheral edema  GI: soft, nontender, positive bowel sounds 4 quadrants MUSCULOSKELETAL: moves rt upper and lower extremity well.  Sensation present on left, no movement from previous CVA, nontender NEUROLOGIC: Oriented to time, place, person. Cranial nerves 2-12 grossly intact, speech clear, no tremor. Repeats himself  PSYCHIATRIC: Mood and affect appropriate to situation.    ASSESSMENT/PLAN   Mixed Alzheimer's and vascular dementia Most recent MDS review 07/2013: BIMS 10/15, PHQ-9 0/27. No mood or behavior issues identified. Functional status: Extensive assist with all ADLs except eating, frequently incontinent of bowel and bladder. Verbal skills intact, participates in activities, visits with spouse daily, family remains involved. Continue medication.    HTN (hypertension) Recent blood pressure range satisfactory, 109 and 132/6472, pulse rate 79-99. Continue current medication  Diabetes mellitus with renal manifestation Fasting CBG range remains satisfactory, 123-158. 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 He's not having any pain or abnormal movements of his left upper or lower extremities. He is able to assist with mobilization in a manual wheelchair  Rhinitis due to pollen Seasonal allergy symptoms of nasal congestion, dry cough and scratchy throat. Significantly high pollen count this time of year . Treat with daily dose of loratadine  for the next 3-4 weeks   Follow up: Routine follow up or as needed  Mardene Celeste, NP-C [facility] [phone]  09/09/2013

## 2013-09-09 NOTE — Assessment & Plan Note (Signed)
Seasonal allergy symptoms of nasal congestion, dry cough and scratchy throat. Significantly high pollen count this time of year . Treat with daily dose of loratadine  for the next 3-4 weeks

## 2013-10-18 ENCOUNTER — Non-Acute Institutional Stay: Payer: Medicare Other

## 2013-10-18 ENCOUNTER — Non-Acute Institutional Stay (SKILLED_NURSING_FACILITY): Payer: Medicare Other | Admitting: Internal Medicine

## 2013-10-18 DIAGNOSIS — E1129 Type 2 diabetes mellitus with other diabetic kidney complication: Secondary | ICD-10-CM | POA: Diagnosis not present

## 2013-10-18 DIAGNOSIS — D509 Iron deficiency anemia, unspecified: Secondary | ICD-10-CM

## 2013-10-18 DIAGNOSIS — F0280 Dementia in other diseases classified elsewhere without behavioral disturbance: Secondary | ICD-10-CM

## 2013-10-18 DIAGNOSIS — N182 Chronic kidney disease, stage 2 (mild): Secondary | ICD-10-CM

## 2013-10-18 DIAGNOSIS — Z299 Encounter for prophylactic measures, unspecified: Secondary | ICD-10-CM

## 2013-10-18 DIAGNOSIS — J301 Allergic rhinitis due to pollen: Secondary | ICD-10-CM

## 2013-10-18 DIAGNOSIS — C44211 Basal cell carcinoma of skin of unspecified ear and external auricular canal: Secondary | ICD-10-CM | POA: Diagnosis not present

## 2013-10-18 DIAGNOSIS — C44219 Basal cell carcinoma of skin of left ear and external auricular canal: Secondary | ICD-10-CM

## 2013-10-18 DIAGNOSIS — N189 Chronic kidney disease, unspecified: Secondary | ICD-10-CM

## 2013-10-18 DIAGNOSIS — F0150 Vascular dementia without behavioral disturbance: Secondary | ICD-10-CM

## 2013-10-18 DIAGNOSIS — G309 Alzheimer's disease, unspecified: Secondary | ICD-10-CM

## 2013-10-18 DIAGNOSIS — I10 Essential (primary) hypertension: Secondary | ICD-10-CM

## 2013-10-18 DIAGNOSIS — C4491 Basal cell carcinoma of skin, unspecified: Secondary | ICD-10-CM

## 2013-10-18 DIAGNOSIS — E1122 Type 2 diabetes mellitus with diabetic chronic kidney disease: Secondary | ICD-10-CM

## 2013-10-18 NOTE — Progress Notes (Signed)
Patient ID: George Kemp, male   DOB: 1926-09-09, 80 y.o.   MRN: [id number]        [facility] [phone])  Code Status: DNR  Contact Information   Name Relation Home Work Mobile   [place] Daughter [phone]  [phone]   [place], [place] [phone]  [phone]       Chief Complaint  Patient presents with  . Medical Management of Chronic Issues    HPI: This is a 80 y.o. male resident of [facility], Skilled Nursing section evaluated today for management of ongoing medical issues.  He has a h/o of CVA with left sided hemiparesis and is non-ambulatory. Last month he was treated with 4 weeks of loratadine for allergic rhinitis related to pollen.   Last Visit  CKD (chronic kidney disease), stage II CKD stable. Slight bump in BUN from 21-25. 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 Hgb A1C 7.1 on 06/27/13. Currently he is not taking meds for this problem.   Dementia Stable. Currently on Aricept and tolerating well. Current MDS completed 08/02/13. BIMS score 10/15.  PHQ-9 score is 0.   Anemia, iron deficiency hbg and hct are improved with Jan labs (H/H-11.5/34.2)  HTN Currently on Amlodipine and metoprolol. BP stable. Tolerating meds well. BMP acceptable.   Rhinitis Trial of loratadine last month. Pt denies rhinorrhea, sneezing, or cough    Since last visit patient has had not had any acute medical issues.    No Known Allergies  MEDICATIONS -  Reviewed.   DATA REVIEWED  Laboratory Studies: Lab Results  Component Value Date   WBC 6.3 06/27/2013   HGB 11.5* 06/27/2013   HCT 34* 06/27/2013   MCV 95.1 12/06/2012   PLT 176 06/27/2013    Lab Results  Component Value Date   NA 144 06/27/2013   K 4.2 06/27/2013   GLU 137 06/27/2013   BUN 25* 06/27/2013   CREATININE 1.2 06/27/2013    Lab Results  Component Value Date   HGBA1C 7.1* 06/27/2013   Lab Results    Component Value Date   CHOL 152 06/27/2013   HDL 55 06/27/2013   LDLCALC 78 06/27/2013   TRIG 83 06/27/2013   Lab Results  Component Value Date   TSH 1.03 06/27/2013    REVIEW OF SYSTEMS  DATA OBTAINED: from patient, nurse, medical record GENERAL: Feels well  No recent fever, fatigue, change in activity status, appetite, or weight  EYES: RESPIRATORY: Dry cough, stuffy nose  No wheezing, SOB CARDIAC: No chest pain, palpitations. No edema GI: No abdominal pain  No Nausea,vomiting,diarrhea or constipation  No heartburn or reflux. Frequent bowel incontinence GU: Frequent incontinence urine  MUSCULOSKELETAL: No joint pain, No muscle ache, pain, weakness. No recent falls. Wheelchair, nonambulatory, left hemiparesis, lift for transfers NEUROLOGIC: No dizziness, fainting, headache, numbness. Dementia unchanged PSYCHIATRIC: No feelings of anxiety, depression  Sleeps well.   PHYSICAL EXAM Filed Vitals:   10/18/13 1023  BP: 113/70  Pulse: 96  Temp: 97.4 F (36.3 C)  Resp: 24  Weight: 170 lb (77.111 kg)  SpO2: 98%   Body mass index is 23.72 kg/(m^2).  GENERAL APPEARANCE: No acute distress, appropriately groomed, normal body habitus Alert, pleasant, conversant. SKIN: No diaphoresis, rash, wound HEAD: Normocephalic, atraumatic EYES: Conjunctiva/lids clear,  IOL bilateral  RESPIRATORY: Breathing is even, unlabored  Lung sounds are clear and full. Dry cough during exam, voice is raspy CARDIOVASCULAR: Heart RRR   No murmur or extra heart  sounds, Bilateral radial and pedal pulses                           EDEMA: No peripheral edema  GI: soft, nontender, positive bowel sounds 4 quadrants MUSCULOSKELETAL: moves rt upper and lower extremity well.  Sensation present on left, no movement from previous CVA, nontender NEUROLOGIC: Oriented to time, place, person. Cranial nerves 2-12 grossly intact, speech clear, no tremor. Repeats himself  PSYCHIATRIC: Mood and affect appropriate to situation.     ASSESSMENT/PLAN   HTN (hypertension) BP stable on current meds. Continue and follow BMP in July.  Mixed Alzheimer's and vascular dementia Most recent MDS review 07/2013: BIMS 10/15, PHQ-9 0/27. No mood or behavior issues identified. Functional status: Extensive assist with all ADLs except eating, frequently incontinent of bowel and bladder. Verbal skills intact, participates in activities, visits with spouse daily, family remains involved. Continue medication.    CKD (chronic kidney disease), stage II Stable. Will continue to monitor at regular intervals.  Anemia, iron deficiency Stable. Will recheck CBC in July  Diabetes mellitus with renal manifestation A1C stable without medications. Will continue to monitor.   Basal cell carcinoma of other specified sites of skin Previous hx on left preauricular area. He currently has a suspicious lesion on the top of his head.  Will refer to facility Dermatologist.  Rhinitis due to pollen Resolved.    Follow up: Routine follow up or as needed  George T.Krell, NP-C/[employer] executive, MSN [facility] [phone]  10/18/2013

## 2013-10-18 NOTE — Assessment & Plan Note (Signed)
Previous hx on left preauricular area. He currently has a suspicious lesion on the top of his head.  Will refer to facility Dermatologist.

## 2013-10-18 NOTE — Assessment & Plan Note (Signed)
Resolved

## 2013-10-18 NOTE — Assessment & Plan Note (Signed)
BP stable on current meds. Continue and follow BMP in July.

## 2013-10-18 NOTE — Assessment & Plan Note (Signed)
A1C stable without medications. Will continue to monitor.

## 2013-10-18 NOTE — Assessment & Plan Note (Signed)
Stable. Will continue to monitor at regular intervals.

## 2013-10-18 NOTE — Assessment & Plan Note (Signed)
Stable. Will recheck CBC in July

## 2013-10-18 NOTE — Assessment & Plan Note (Signed)
Most recent MDS review 07/2013: BIMS 10/15, PHQ-9 0/27. No mood or behavior issues identified. Functional status: Extensive assist with all ADLs except eating, frequently incontinent of bowel and bladder. Verbal skills intact, participates in activities, visits with spouse daily, family remains involved. Continue medication.   

## 2013-12-03 ENCOUNTER — Non-Acute Institutional Stay (SKILLED_NURSING_FACILITY): Payer: Medicare Other | Admitting: Nurse Practitioner

## 2013-12-03 DIAGNOSIS — K5900 Constipation, unspecified: Secondary | ICD-10-CM

## 2013-12-03 DIAGNOSIS — N189 Chronic kidney disease, unspecified: Secondary | ICD-10-CM

## 2013-12-03 DIAGNOSIS — F0150 Vascular dementia without behavioral disturbance: Secondary | ICD-10-CM

## 2013-12-03 DIAGNOSIS — N182 Chronic kidney disease, stage 2 (mild): Secondary | ICD-10-CM

## 2013-12-03 DIAGNOSIS — F319 Bipolar disorder, unspecified: Secondary | ICD-10-CM

## 2013-12-03 DIAGNOSIS — I10 Essential (primary) hypertension: Secondary | ICD-10-CM

## 2013-12-03 DIAGNOSIS — G309 Alzheimer's disease, unspecified: Secondary | ICD-10-CM

## 2013-12-03 DIAGNOSIS — E1122 Type 2 diabetes mellitus with diabetic chronic kidney disease: Secondary | ICD-10-CM

## 2013-12-03 DIAGNOSIS — E1129 Type 2 diabetes mellitus with other diabetic kidney complication: Secondary | ICD-10-CM

## 2013-12-03 DIAGNOSIS — F411 Generalized anxiety disorder: Secondary | ICD-10-CM

## 2013-12-03 DIAGNOSIS — F0280 Dementia in other diseases classified elsewhere without behavioral disturbance: Secondary | ICD-10-CM

## 2013-12-03 NOTE — Progress Notes (Signed)
Patient ID: George Kemp, male   DOB: [birth year], 80 y.o.   MRN: [id number]    Nursing Home Location:  [facility] of Service: SNF (31)  PCP: Estill Dooms, MD  No Known Allergies  Chief Complaint  Patient presents with  . Medical Management of Chronic Issues    HPI:  This is a 80 y.o. male resident of [facility], Skilled Nursing section evaluated today for management of ongoing medical issues. He has a h/o of CVA with left sided hemiparesis and is non-ambulatory. Pt conts to take medications as prescribed and there is no major changes in status, without any acute concerns today by nursing or complaints by George Kemp.   Review of Systems:  DATA OBTAINED: from patient, nurse, medical record  GENERAL: Feels well No recent fever, fatigue, change in activity status, appetite, or weight  EYES:  RESPIRATORY:No wheezing, SOB  CARDIAC: No chest pain, palpitations. No edema  GI: No abdominal pain No Nausea,vomiting,diarrhea or constipation No heartburn or reflux. bowel incontinence  GU: incontinence urine, no changes in urinary habits  MUSCULOSKELETAL: No joint pain, No muscle ache, pain, weakness. No recent falls. Wheelchair, nonambulatory, left hemiparesis, lift for transfers  NEUROLOGIC: No dizziness, fainting, headache, numbness. Dementia unchanged  PSYCHIATRIC: No feelings of anxiety, depression Sleeps well   Past Medical History  Diagnosis Date  . Stroke 08/2009    Left side with residual deficit  . Hypertension   . Bipolar 1 disorder   . Dementia   . Diabetes   . Pressure ulcer of sacrum 12/07/2012  . Anemia, iron deficiency 12/30/2012  . CKD (chronic kidney disease), stage II 07/09/2012    07/16/12:   stage II chronic kidney disease by GFR.  Follow lab at interval    . Mixed Alzheimer's and vascular dementia 12/01/2012    06/07/12: STML re: CVA- contribute to debility/dependence. Pt. is aware of deficit. Will benefit from [place] environment for assistance, supervision, socialization    07/16/12:  Admission MDS reviewed: BIMS score 13/15,  functional status: Pt requires limited to extensive assist with all ADLs except eating (supervision)    . GI bleed   . Basal cell carcinoma    Past Surgical History  Procedure Laterality Date  . Hip arthroplasty Left 12/01/2012    Procedure: ARTHROPLASTY BIPOLAR HIP;  Surgeon: Johnn Hai, MD;  Location: [facility];  Service: Orthopedics;  Laterality: Left;   Social History:   reports that he has quit smoking. He does not have any smokeless tobacco history on file. He reports that he does not drink alcohol or use illicit drugs.  Family History  Problem Relation Age of Onset  . Stroke Mother   . Congestive Heart Failure Mother   . Stroke Brother      X 2  . Diabetes Brother   . Cancer - Other Father     Unknown  Type  . Thyroid disease Daughter   . Cancer Father     Medications: Patient's Medications  New Prescriptions   No medications on file  Previous Medications   ACETAMINOPHEN (TYLENOL) 325 MG TABLET    Take 650 mg by mouth every 6 (six) hours as needed for pain.   AMLODIPINE (NORVASC) 5 MG TABLET    Take 5 mg by mouth every morning.   CHOLECALCIFEROL (VITAMIN D) 1000 UNITS TABLET    Take 1,000 Units by mouth every morning.   CLOMIPRAMINE (ANAFRANIL) 25 MG CAPSULE  Take 25 mg by mouth every morning.   DONEPEZIL (ARICEPT) 10 MG TABLET    Take 10 mg by mouth daily.   LACTOSE FREE NUTRITION (BOOST PLUS) LIQD    Take 237 mLs by mouth. Supplement at bedtime   LATANOPROST (XALATAN) 0.005 % OPHTHALMIC SOLUTION    Place 1 drop into both eyes at bedtime.   LORATADINE (CLARITIN) 10 MG TABLET    Take 10 mg by mouth daily. Take one tablet by mouth daily  As needed for allergies   METOPROLOL TARTRATE (LOPRESSOR) 25 MG TABLET    Take 12.5 mg by mouth 2 (two) times daily.   MULTIPLE VITAMIN (MULTIVITAMIN WITH MINERALS) TABS    Take 1 tablet by mouth daily.   POLYETHYLENE  GLYCOL (MIRALAX / GLYCOLAX) PACKET    Take 17 g by mouth daily.   QUETIAPINE (SEROQUEL) 25 MG TABLET    Take 25 mg by mouth every other day. Every other hs   SENNA (SENOKOT) 8.6 MG TABS    Take 2 tablets (17.2 mg total) by mouth at bedtime.   TAMSULOSIN (FLOMAX) 0.4 MG CAPS    Take 0.4 mg by mouth daily after breakfast.  Modified Medications   No medications on file  Discontinued Medications   No medications on file     Physical Exam:  Filed Vitals:   12/03/13 1318  BP: 109/70  Pulse: 97  Temp: 97 F (36.1 C)  Resp: 20  Weight: 171 lb (77.565 kg)    GENERAL APPEARANCE: No acute distress, appropriately groomed, normal body habitus Alert, pleasant, conversant.  SKIN: No diaphoresis, rash, wound  RESPIRATORY: Breathing is even, unlabored Lung sounds are CTA CARDIOVASCULAR: Heart RRR No murmur or extra heart sounds, Bilateral radial and pedal pulses  EDEMA: No peripheral edema  GI: soft, nontender, positive bowel sounds 4 quadrants  MUSCULOSKELETAL: moves rt upper and lower extremity well. Sensation present on left, no movement from previous CVA, nontender  NEUROLOGIC: Oriented to time, place, person. Cranial nerves 2-12 grossly intact, speech clear, no tremor. With dementia  PSYCHIATRIC: Mood and affect appropriate to situation.      Labs reviewed: Basic Metabolic Panel:  Recent Labs  12/06/12 1909 03/26/13 06/27/13  NA 144 142 144  K 3.8 3.9 4.2  CL 108  --   --   CO2 30  --   --   GLUCOSE 150*  --   --   BUN 28* 21 25*  CREATININE 1.25 1.3 1.2  CALCIUM 8.6  --   --    Liver Function Tests: No results found for this basename: AST, ALT, ALKPHOS, BILITOT, PROT, ALBUMIN,  in the last 8760 hours No results found for this basename: LIPASE, AMYLASE,  in the last 8760 hours No results found for this basename: AMMONIA,  in the last 8760 hours CBC:  Recent Labs  12/06/12 1909 12/07/12 0405 03/26/13 06/27/13  WBC 7.6  --  5.4 6.3  NEUTROABS 5.1  --   --   --   HGB  7.7* 8.7* 10.1* 11.5*  HCT 23.4* 25.8* 30* 34*  MCV 95.1  --   --   --   PLT 172  --  194 176   Cardiac Enzymes: No results found for this basename: CKTOTAL, CKMB, CKMBINDEX, TROPONINI,  in the last 8760 hours BNP: No components found with this basename: POCBNP,  CBG:  Recent Labs  12/06/12 2000  GLUCAP 119*   TSH:  Recent Labs  01/17/13 06/27/13  TSH 0.42 1.03  A1C: Lab Results  Component Value Date   HGBA1C 7.1* 06/27/2013   Lipid Panel:  Recent Labs  06/27/13  CHOL 152  HDL 55  LDLCALC 78  TRIG 83     Assessment/Plan 1. Essential hypertension -stable at this time on current medications  2. Mixed Alzheimer's and vascular dementia -stable, has not had any significant changes  -conts on aricept  3. Anxiety state, unspecified -has been stable  4. Unspecified constipation -well controlled on miralax  5. Bipolar disorder, unspecified Seroquel recently reduced to every other day, no changes in behaviors, will cont to have agitation at time. Will cont current dosing and frequency at this time.   6. Type 2 diabetes mellitus with diabetic chronic kidney disease Not currently on any medications, blood sugars in the AM ranging from 148-180, will follow up A1c at this time  7. CKD (chronic kidney disease), stage II -will update labs, cbc and cmp

## 2013-12-05 LAB — CBC AND DIFFERENTIAL
HCT: 34 % — AB (ref 41–53)
HEMOGLOBIN: 11.8 g/dL — AB (ref 13.5–17.5)
PLATELETS: 183 10*3/uL (ref 150–399)
WBC: 5.9 10*3/mL

## 2013-12-05 LAB — BASIC METABOLIC PANEL
BUN: 137 mg/dL — AB (ref 4–21)
Creatinine: 1.3 mg/dL (ref 0.6–1.3)
GLUCOSE: 20 mg/dL
Potassium: 4.1 mmol/L (ref 3.4–5.3)
Sodium: 143 mmol/L (ref 137–147)

## 2013-12-05 LAB — HEMOGLOBIN A1C: Hgb A1c MFr Bld: 7.2 % — AB (ref 4.0–6.0)

## 2014-01-21 ENCOUNTER — Non-Acute Institutional Stay (SKILLED_NURSING_FACILITY): Payer: Medicare Other | Admitting: Nurse Practitioner

## 2014-01-21 DIAGNOSIS — F0280 Dementia in other diseases classified elsewhere without behavioral disturbance: Secondary | ICD-10-CM

## 2014-01-21 DIAGNOSIS — F319 Bipolar disorder, unspecified: Secondary | ICD-10-CM

## 2014-01-21 DIAGNOSIS — D509 Iron deficiency anemia, unspecified: Secondary | ICD-10-CM

## 2014-01-21 DIAGNOSIS — I10 Essential (primary) hypertension: Secondary | ICD-10-CM

## 2014-01-21 DIAGNOSIS — E1122 Type 2 diabetes mellitus with diabetic chronic kidney disease: Secondary | ICD-10-CM

## 2014-01-21 DIAGNOSIS — G309 Alzheimer's disease, unspecified: Secondary | ICD-10-CM

## 2014-01-21 DIAGNOSIS — E1129 Type 2 diabetes mellitus with other diabetic kidney complication: Secondary | ICD-10-CM

## 2014-01-21 DIAGNOSIS — F0150 Vascular dementia without behavioral disturbance: Secondary | ICD-10-CM

## 2014-01-21 DIAGNOSIS — N189 Chronic kidney disease, unspecified: Secondary | ICD-10-CM

## 2014-01-21 DIAGNOSIS — K5900 Constipation, unspecified: Secondary | ICD-10-CM

## 2014-01-21 NOTE — Progress Notes (Signed)
Patient ID: George Kemp, male   DOB: Jun 01, 1926, 80 y.o.   MRN: [id number]    Nursing Home Location:  [place] of Service: SNF (31)  PCP: Estill Dooms, MD  No Known Allergies  Chief Complaint  Patient presents with  . Medical Management of Chronic Issues    HPI:  This is a 80 y.o. male resident of [facility], Skilled Nursing section evaluated today for management of ongoing medical issues. He has a h/o of CVA with left sided hemiparesis and is non-ambulatory. Pt conts to eat well. Does not have any complaints. No changes in cognitive status. Over the last month pt did exhibit some increase in behaviors due to bipolar disorder from attempted dose reduction in Seroquel. This was increased back to nightly and behaviors have improved.  Review of Systems:  DATA OBTAINED: from patient, nurse  GENERAL: Feels well No recent fever, fatigue, change in activity status, appetite, or weight  RESPIRATORY:No wheezing, SOB  CARDIAC: No chest pain, palpitations. No edema  GI: No abdominal pain No Nausea,vomiting,diarrhea or constipation No heartburn or reflux. bowel incontinence with increase in loose stools per nursing  GU: incontinence urine, no changes in urinary habits  MUSCULOSKELETAL: No joint pain, No muscle ache, pain, weakness. No recent falls. Wheelchair, nonambulatory, left hemiparesis, lift for transfers  NEUROLOGIC: No dizziness, fainting, headache, numbness. Dementia unchanged  PSYCHIATRIC: No feelings of anxiety, depression Sleeps well   Past Medical History  Diagnosis Date  . Stroke 08/2009    Left side with residual deficit  . Hypertension   . Bipolar 1 disorder   . Dementia   . Diabetes   . Pressure ulcer of sacrum 12/07/2012  . Anemia, iron deficiency 12/30/2012  . CKD (chronic kidney disease), stage II 07/09/2012    07/16/12:   stage II chronic kidney disease by GFR.  Follow lab at interval    . Mixed Alzheimer's and  vascular dementia 12/01/2012    06/07/12: STML re: CVA- contribute to debility/dependence. Pt. is aware of deficit. Will benefit from [place] environment for assistance, supervision, socialization    07/16/12:  Admission MDS reviewed: BIMS score 13/15,  functional status: Pt requires limited to extensive assist with all ADLs except eating (supervision)    . GI bleed   . Basal cell carcinoma    Past Surgical History  Procedure Laterality Date  . Hip arthroplasty Left 12/01/2012    Procedure: ARTHROPLASTY BIPOLAR HIP;  Surgeon: Johnn Hai, MD;  Location: [facility];  Service: Orthopedics;  Laterality: Left;   Social History:   reports that he has quit smoking. He does not have any smokeless tobacco history on file. He reports that he does not drink alcohol or use illicit drugs.  Family History  Problem Relation Age of Onset  . Stroke Mother   . Congestive Heart Failure Mother   . Stroke Brother      X 2  . Diabetes Brother   . Cancer - Other Father     Unknown  Type  . Thyroid disease Daughter   . Cancer Father     Medications: Patient's Medications  New Prescriptions   No medications on file  Previous Medications   ACETAMINOPHEN (TYLENOL) 325 MG TABLET    Take 650 mg by mouth every 6 (six) hours as needed for pain.   AMLODIPINE (NORVASC) 5 MG TABLET    Take 5 mg by mouth every morning.   CHOLECALCIFEROL (VITAMIN D) 1000 UNITS  TABLET    Take 1,000 Units by mouth every morning.   CLOMIPRAMINE (ANAFRANIL) 25 MG CAPSULE    Take 25 mg by mouth every morning.   DONEPEZIL (ARICEPT) 10 MG TABLET    Take 10 mg by mouth daily.   LACTOSE FREE NUTRITION (BOOST PLUS) LIQD    Take 237 mLs by mouth. Supplement at bedtime   LATANOPROST (XALATAN) 0.005 % OPHTHALMIC SOLUTION    Place 1 drop into both eyes at bedtime.   LORATADINE (CLARITIN) 10 MG TABLET    Take 10 mg by mouth daily as needed. Take one tablet by mouth daily prn  As needed for allergies   METOPROLOL TARTRATE (LOPRESSOR) 25 MG  TABLET    Take 12.5 mg by mouth 2 (two) times daily.   MULTIPLE VITAMIN (MULTIVITAMIN WITH MINERALS) TABS    Take 1 tablet by mouth daily.   POLYETHYLENE GLYCOL (MIRALAX / GLYCOLAX) PACKET    Take 17 g by mouth daily.   QUETIAPINE (SEROQUEL) 25 MG TABLET    Take 25 mg by mouth at bedtime. Every other hs   SENNA (SENOKOT) 8.6 MG TABS    Take 2 tablets (17.2 mg total) by mouth at bedtime.   TAMSULOSIN (FLOMAX) 0.4 MG CAPS    Take 0.4 mg by mouth daily after breakfast.  Modified Medications   No medications on file  Discontinued Medications   No medications on file     Physical Exam:  Filed Vitals:   01/21/14 1259  BP: 121/68  Pulse: 96  Temp: 97.4 F (36.3 C)  Resp: 20  Weight: 174 lb (78.926 kg)    GENERAL APPEARANCE: No acute distress, appropriately groomed, normal body habitus Alert, pleasant, conversant.  SKIN: No diaphoresis, rash, wound  RESPIRATORY: Breathing is even, unlabored Lung sounds are CTA CARDIOVASCULAR: Heart RRR No murmur or extra heart sounds, Bilateral radial and pedal pulses  EDEMA: No peripheral edema  GI: soft, nontender, positive bowel sounds 4 quadrants  MUSCULOSKELETAL: moves rt upper and lower extremity well. Sensation present on left, able to slightly move fingers on left but otherwise no movement from previous CVA, nontender  NEUROLOGIC: Oriented to time, place, person. speech clear, no tremor. With dementia  PSYCHIATRIC: Mood and affect appropriate to situation.      Labs reviewed: Basic Metabolic Panel:  Recent Labs  03/26/13 06/27/13 12/05/13  NA 142 144 143  K 3.9 4.2 4.1  BUN 21 25* 137*  CREATININE 1.3 1.2 1.3   Liver Function Tests: No results found for this basename: AST, ALT, ALKPHOS, BILITOT, PROT, ALBUMIN,  in the last 8760 hours No results found for this basename: LIPASE, AMYLASE,  in the last 8760 hours No results found for this basename: AMMONIA,  in the last 8760 hours CBC:  Recent Labs  03/26/13 06/27/13 12/05/13  WBC  5.4 6.3 5.9  HGB 10.1* 11.5* 11.8*  HCT 30* 34* 34*  PLT 194 176 183   Cardiac Enzymes: No results found for this basename: CKTOTAL, CKMB, CKMBINDEX, TROPONINI,  in the last 8760 hours BNP: No components found with this basename: POCBNP,  CBG: No results found for this basename: GLUCAP,  in the last 8760 hours TSH:  Recent Labs  06/27/13  TSH 1.03   A1C: Lab Results  Component Value Date   HGBA1C 7.2* 12/05/2013   Lipid Panel:  Recent Labs  06/27/13  CHOL 152  HDL 55  LDLCALC 78  TRIG 83     Assessment/Plan  1. Essential hypertension -Patient is  stable; continue current regimen. Will monitor and make changes as necessary.  2. Unspecified constipation -nursing reports increase in loose stools, will decrease senokot s a to 1 tablet qhs  3. Mixed Alzheimer's and vascular dementia -stable, without significant decline in cognitive or functional status in the last month   4. Type 2 diabetes mellitus with diabetic chronic kidney disease -cbgs reviewed range from 155-175 fasting, A1c at 7.2 which is appropriate for age. Will cont to follow  5. Anemia, iron deficiency Cbc reviewed, hgb remains stable  6. Bipolar disorder, unspecified -attempted dose reduction for Seroquel however due to increase in behaviors Seroquel was increased to nightly. Since adjustment mood has stabilized

## 2014-01-24 DIAGNOSIS — C4442 Squamous cell carcinoma of skin of scalp and neck: Secondary | ICD-10-CM

## 2014-01-24 HISTORY — DX: Squamous cell carcinoma of skin of scalp and neck: C44.42

## 2014-01-24 HISTORY — PX: SKIN LESION EXCISION: SHX2412

## 2014-02-25 ENCOUNTER — Non-Acute Institutional Stay (SKILLED_NURSING_FACILITY): Payer: Medicare Other | Admitting: Nurse Practitioner

## 2014-02-25 DIAGNOSIS — IMO0002 Reserved for concepts with insufficient information to code with codable children: Secondary | ICD-10-CM | POA: Insufficient documentation

## 2014-02-25 DIAGNOSIS — I10 Essential (primary) hypertension: Secondary | ICD-10-CM

## 2014-02-25 DIAGNOSIS — K5900 Constipation, unspecified: Secondary | ICD-10-CM

## 2014-02-25 DIAGNOSIS — N189 Chronic kidney disease, unspecified: Secondary | ICD-10-CM

## 2014-02-25 DIAGNOSIS — E1122 Type 2 diabetes mellitus with diabetic chronic kidney disease: Secondary | ICD-10-CM

## 2014-02-25 DIAGNOSIS — E1129 Type 2 diabetes mellitus with other diabetic kidney complication: Secondary | ICD-10-CM

## 2014-02-25 DIAGNOSIS — G309 Alzheimer's disease, unspecified: Secondary | ICD-10-CM

## 2014-02-25 DIAGNOSIS — C801 Malignant (primary) neoplasm, unspecified: Secondary | ICD-10-CM

## 2014-02-25 DIAGNOSIS — F0150 Vascular dementia without behavioral disturbance: Secondary | ICD-10-CM

## 2014-02-25 DIAGNOSIS — F0280 Dementia in other diseases classified elsewhere without behavioral disturbance: Secondary | ICD-10-CM

## 2014-02-25 NOTE — Progress Notes (Signed)
Patient ID: George Kemp, male   DOB: 1927-03-08, 80 y.o.   MRN: [id number]    Nursing Home Location:  [place] of Service: SNF (31)  PCP: Estill Dooms, MD  No Known Allergies  Chief Complaint  Patient presents with  . Medical Management of Chronic Issues    HPI:  This is a 80 y.o. male resident of [facility], Skilled Nursing section being seen today for routine follow up, pt with pmh of CVA with left sided hemiparesis and is non-ambulatory, dementia, DM, HTN, anemia, CKD, bipolar (failed dose reduction).  Pt without any complaints at this time. Staff notes stools are more regular.   Review of Systems:  Review of Systems DATA OBTAINED: from patient, nurse  GENERAL: Feels well No fever, fatigue, no change in activity status, appetite, or weight  RESPIRATORY:No wheezing, SOB  CARDIAC: No chest pain, palpitations. No edema  GI: No abdominal pain No Nausea,vomiting,diarrhea or constipation No heartburn or reflux. bowel incontinence with increase in loose stools per nursing  GU: incontinence urine, no changes in urinary habits  MUSCULOSKELETAL: No joint pain, No muscle ache, pain, weakness. No recent falls. Wheelchair, nonambulatory, left hemiparesis, lift for transfers  NEUROLOGIC: No dizziness, fainting, headache, numbness. memory loss PSYCHIATRIC: No feelings of anxiety, depression Sleeps well  Past Medical History  Diagnosis Date  . Stroke 08/2009    Left side with residual deficit  . Hypertension   . Bipolar 1 disorder   . Dementia   . Diabetes   . Pressure ulcer of sacrum 12/07/2012  . Anemia, iron deficiency 12/30/2012  . CKD (chronic kidney disease), stage II 07/09/2012    07/16/12:   stage II chronic kidney disease by GFR.  Follow lab at interval    . Mixed Alzheimer's and vascular dementia 12/01/2012    06/07/12: STML re: CVA- contribute to debility/dependence. Pt. is aware of deficit. Will benefit from [place]  environment for assistance, supervision, socialization    07/16/12:  Admission MDS reviewed: BIMS score 13/15,  functional status: Pt requires limited to extensive assist with all ADLs except eating (supervision)    . GI bleed   . Basal cell carcinoma   . Squamous cell carcinoma of skin of scalp 01/24/14    anterior and posterior scalp    Past Surgical History  Procedure Laterality Date  . Hip arthroplasty Left 12/01/2012    Procedure: ARTHROPLASTY BIPOLAR HIP;  Surgeon: Johnn Hai, MD;  Location: [facility];  Service: Orthopedics;  Laterality: Left;  . Skin lesion excision  01/24/14    Anterior and posterio scalp Squamous cell [name] PA   Social History:   reports that he has quit smoking. He does not have any smokeless tobacco history on file. He reports that he does not drink alcohol or use illicit drugs.  Family History  Problem Relation Age of Onset  . Stroke Mother   . Congestive Heart Failure Mother   . Stroke Brother      X 2  . Diabetes Brother   . Cancer - Other Father     Unknown  Type  . Thyroid disease Daughter   . Cancer Father     Medications: Patient's Medications  New Prescriptions   No medications on file  Previous Medications   ACETAMINOPHEN (TYLENOL) 325 MG TABLET    Take 650 mg by mouth every 6 (six) hours as needed for pain.   AMLODIPINE (NORVASC) 5 MG TABLET  Take 5 mg by mouth every morning.   CHOLECALCIFEROL (VITAMIN D) 1000 UNITS TABLET    Take 1,000 Units by mouth every morning.   CLOMIPRAMINE (ANAFRANIL) 25 MG CAPSULE    Take 25 mg by mouth every morning.   DONEPEZIL (ARICEPT) 10 MG TABLET    Take 10 mg by mouth daily.   LACTOSE FREE NUTRITION (BOOST PLUS) LIQD    Take 237 mLs by mouth. Supplement at bedtime   LATANOPROST (XALATAN) 0.005 % OPHTHALMIC SOLUTION    Place 1 drop into both eyes at bedtime.   LORATADINE (CLARITIN) 10 MG TABLET    Take 10 mg by mouth daily as needed. Take one tablet by mouth daily prn  As needed for allergies    METOPROLOL TARTRATE (LOPRESSOR) 25 MG TABLET    Take 12.5 mg by mouth 2 (two) times daily.   MULTIPLE VITAMIN (MULTIVITAMIN WITH MINERALS) TABS    Take 1 tablet by mouth daily.   POLYETHYLENE GLYCOL (MIRALAX / GLYCOLAX) PACKET    Take 17 g by mouth daily.   QUETIAPINE (SEROQUEL) 25 MG TABLET    Take 25 mg by mouth at bedtime. Every other hs   TAMSULOSIN (FLOMAX) 0.4 MG CAPS    Take 0.4 mg by mouth daily after breakfast.  Modified Medications   Modified Medication Previous Medication   SENNA (SENOKOT) 8.6 MG TABS TABLET senna (SENOKOT) 8.6 MG TABS      Take 1 tablet by mouth at bedtime.    Take 2 tablets (17.2 mg total) by mouth at bedtime.  Discontinued Medications   No medications on file     Physical Exam: Filed Vitals:   02/25/14 1531  BP: 135/69  Pulse: 99  Temp: 97.9 F (36.6 C)  Resp: 20    Physical Exam GENERAL APPEARANCE: No acute distress, appropriately groomed, normal body habitus Alert, pleasant, conversant.  SKIN: No diaphoresis, rash RESPIRATORY: Breathing is even, unlabored Lung sounds are CTA  CARDIOVASCULAR: Heart RRR No murmur or extra heart sounds, Bilateral radial and pedal pulses  EDEMA: No peripheral edema  GI: soft, nontender, positive bowel sounds 4 quadrants  MUSCULOSKELETAL: moves rt upper and lower extremity well. Sensation present on left, able to slightly move fingers on left but otherwise no movement from previous CVA, nontender  NEUROLOGIC: Oriented to time, place, person. speech clear, no tremor. With dementia  PSYCHIATRIC: Mood and affect appropriate to situation.   Labs reviewed: Basic Metabolic Panel:  Recent Labs  03/26/13 06/27/13 12/05/13  NA 142 144 143  K 3.9 4.2 4.1  BUN 21 25* 137*  CREATININE 1.3 1.2 1.3   Liver Function Tests: No results found for this basename: AST, ALT, ALKPHOS, BILITOT, PROT, ALBUMIN,  in the last 8760 hours No results found for this basename: LIPASE, AMYLASE,  in the last 8760 hours No results found for  this basename: AMMONIA,  in the last 8760 hours CBC:  Recent Labs  03/26/13 06/27/13 12/05/13  WBC 5.4 6.3 5.9  HGB 10.1* 11.5* 11.8*  HCT 30* 34* 34*  PLT 194 176 183   TSH:  Recent Labs  06/27/13  TSH 1.03   A1C: Lab Results  Component Value Date   HGBA1C 7.2* 12/05/2013   Lipid Panel:  Recent Labs  06/27/13  CHOL 152  HDL 55  LDLCALC 78  TRIG 83     Assessment/Plan 1. Unspecified constipation -loose stool have improved with reduction of senna, will cont current medications  2. Essential hypertension -Patients blood pressure is stable;  continue current regimen. Will monitor and make changes as necessary.  3. Mixed Alzheimer's and vascular dementia -without acute changes in functional or cognitive status, does well in current environment   4. Type 2 diabetes mellitus with diabetic chronic kidney disease -cbgs reviewed 160-180  5. Squamous cell carcinoma -following with dermatology, recently with 2 lesions removed.

## 2014-03-14 ENCOUNTER — Encounter: Payer: Self-pay | Admitting: Internal Medicine

## 2014-03-14 ENCOUNTER — Non-Acute Institutional Stay (SKILLED_NURSING_FACILITY): Payer: Medicare Other | Admitting: Internal Medicine

## 2014-03-14 DIAGNOSIS — N189 Chronic kidney disease, unspecified: Secondary | ICD-10-CM

## 2014-03-14 DIAGNOSIS — E1122 Type 2 diabetes mellitus with diabetic chronic kidney disease: Secondary | ICD-10-CM | POA: Diagnosis not present

## 2014-03-14 DIAGNOSIS — I10 Essential (primary) hypertension: Secondary | ICD-10-CM

## 2014-03-14 DIAGNOSIS — N182 Chronic kidney disease, stage 2 (mild): Secondary | ICD-10-CM

## 2014-03-14 DIAGNOSIS — K5900 Constipation, unspecified: Secondary | ICD-10-CM

## 2014-03-14 DIAGNOSIS — D509 Iron deficiency anemia, unspecified: Secondary | ICD-10-CM

## 2014-03-14 NOTE — Progress Notes (Signed)
Patient ID: George Kemp, male   DOB: Mar 03, 1927, 80 y.o.   MRN: [id number]    [facility] Room Number: [id number]  [place] of Service: SNF (31) OFFICE   No Known Allergies  Chief Complaint  Patient presents with  . Medical Management of Chronic Issues    HPI:  Type 2 diabetes mellitus with diabetic chronic kidney disease: Mr Desena is up 3# over the past 2 months. Fasting blood sugars have been ~180 for the last month. Last A1C was 7.2.  CKD (chronic kidney disease), stage II: Stable  Anemia, iron deficiency:Stable  Essential hypertension: Controlled  Constipation, unspecified constipation type: Loose stools resolved, reports daily formed BMs with reduced dose of Senna     Medications: Patient's Medications  New Prescriptions   No medications on file  Previous Medications   ACETAMINOPHEN (TYLENOL) 325 MG TABLET    Take 650 mg by mouth every 6 (six) hours as needed for pain.   AMLODIPINE (NORVASC) 5 MG TABLET    Take 5 mg by mouth every morning.   CHOLECALCIFEROL (VITAMIN D) 1000 UNITS TABLET    Take 1,000 Units by mouth every morning.   CLOMIPRAMINE (ANAFRANIL) 25 MG CAPSULE    Take 25 mg by mouth every morning.   DONEPEZIL (ARICEPT) 10 MG TABLET    Take 10 mg by mouth daily.   LACTOSE FREE NUTRITION (BOOST PLUS) LIQD    Take 237 mLs by mouth. Supplement at bedtime   LATANOPROST (XALATAN) 0.005 % OPHTHALMIC SOLUTION    Place 1 drop into both eyes at bedtime.   LORATADINE (CLARITIN) 10 MG TABLET    Take 10 mg by mouth daily as needed. Take one tablet by mouth daily prn  As needed for allergies   METOPROLOL TARTRATE (LOPRESSOR) 25 MG TABLET    Take 12.5 mg by mouth 2 (two) times daily.   MULTIPLE VITAMIN (MULTIVITAMIN WITH MINERALS) TABS    Take 1 tablet by mouth daily.   POLYETHYLENE GLYCOL (MIRALAX / GLYCOLAX) PACKET    Take 17 g by mouth daily.   QUETIAPINE (SEROQUEL) 25 MG TABLET    Take 25 mg by mouth at bedtime. Every other hs   SENNA (SENOKOT) 8.6 MG TABS TABLET    Take 1 tablet by mouth at bedtime.   TAMSULOSIN (FLOMAX) 0.4 MG CAPS    Take 0.4 mg by mouth daily after breakfast.  Modified Medications   No medications on file  Discontinued Medications   No medications on file     Review of Systems  Constitutional: Negative for appetite change.  HENT: Negative.   Eyes: Negative.   Respiratory: Negative for chest tightness and shortness of breath.   Cardiovascular: Negative for chest pain and leg swelling.  Gastrointestinal: Negative for abdominal pain, diarrhea and constipation.       Bowel incontinence  Endocrine: Negative for polydipsia and polyuria.  Genitourinary:       Urinary incontinence  Musculoskeletal: Negative for arthralgias and back pain.       Wheelchair, nonambulatory, left hemiparesis, lift for transfers  Allergic/Immunologic: Negative.   Neurological: Negative for syncope and light-headedness.  Hematological: Negative.   Psychiatric/Behavioral: Negative for agitation.    Filed Vitals:   03/14/14 1000  BP: 129/75  Pulse: 86  Temp: 97.4 F (36.3 C)   There is no weight on file to calculate BMI.  Physical Exam  Constitutional: He is oriented to person, place, and time. He appears well-developed and well-nourished.  HENT:  Head: Normocephalic and atraumatic.  Eyes: Conjunctivae and EOM are normal.  Neck: Normal range of motion. Neck supple.  Cardiovascular: Normal rate, regular rhythm and normal heart sounds.   Pulmonary/Chest: Effort normal. He has rales.  Abdominal: Soft. Bowel sounds are normal.  Musculoskeletal:       Left hand: Decreased strength noted.  moves rt upper and lower extremity well. Sensation present on left, able to squeeze with left, reduced grip strength, no movement in LLE from previous CVA, nontender  Neurological: He is alert and oriented to person, place, and time.  Dementia  Skin: Skin is warm and dry.  Psychiatric: He has a normal mood and affect. His  behavior is normal. Thought content normal.     Labs reviewed: Nursing Home on 01/21/2014  Component Date Value Ref Range Status  . Hemoglobin 12/05/2013 11.8* 13.5 - 17.5 g/dL Final  . HCT 12/05/2013 34* 41 - 53 % Final  . Platelets 12/05/2013 183  150 - 399 K/L Final  . WBC 12/05/2013 5.9   Final  . Glucose 12/05/2013 20   Final  . BUN 12/05/2013 137* 4 - 21 mg/dL Final  . Creatinine 12/05/2013 1.3  0.6 - 1.3 mg/dL Final  . Potassium 12/05/2013 4.1  3.4 - 5.3 mmol/L Final  . Sodium 12/05/2013 143  137 - 147 mmol/L Final  . Hemoglobin A1C 12/05/2013 7.2* 4.0 - 6.0 % Final     Assessment/Plan  1. Type 2 diabetes mellitus with diabetic chronic kidney disease Stop supplemental Ensure Change to Diabetic diet Start Metformin 500 mg daily  2. CKD (chronic kidney disease), stage II Check CMP  3. Anemia, iron deficiency Stable  4. Essential hypertension Stable Check CMP and Lipids  5. Constipation, unspecified constipation type Controlled with Senna 1 tab at bedtime and Miralax daily

## 2014-03-18 LAB — BASIC METABOLIC PANEL
BUN: 18 mg/dL (ref 4–21)
CREATININE: 1.3 mg/dL (ref 0.6–1.3)
GLUCOSE: 160 mg/dL
POTASSIUM: 4.2 mmol/L (ref 3.4–5.3)
Sodium: 143 mmol/L (ref 137–147)

## 2014-03-18 LAB — HEMOGLOBIN A1C: Hgb A1c MFr Bld: 8 % — AB (ref 4.0–6.0)

## 2014-04-17 ENCOUNTER — Encounter: Payer: Self-pay | Admitting: Adult Health

## 2014-04-17 ENCOUNTER — Non-Acute Institutional Stay (SKILLED_NURSING_FACILITY): Payer: Medicare Other | Admitting: Adult Health

## 2014-04-17 DIAGNOSIS — R000 Tachycardia, unspecified: Secondary | ICD-10-CM

## 2014-04-17 DIAGNOSIS — K5900 Constipation, unspecified: Secondary | ICD-10-CM

## 2014-04-17 DIAGNOSIS — F0150 Vascular dementia without behavioral disturbance: Secondary | ICD-10-CM

## 2014-04-17 DIAGNOSIS — G309 Alzheimer's disease, unspecified: Secondary | ICD-10-CM

## 2014-04-17 DIAGNOSIS — F0280 Dementia in other diseases classified elsewhere without behavioral disturbance: Secondary | ICD-10-CM

## 2014-04-17 DIAGNOSIS — I10 Essential (primary) hypertension: Secondary | ICD-10-CM

## 2014-04-17 DIAGNOSIS — I6990 Unspecified sequelae of unspecified cerebrovascular disease: Secondary | ICD-10-CM

## 2014-04-17 DIAGNOSIS — E1122 Type 2 diabetes mellitus with diabetic chronic kidney disease: Secondary | ICD-10-CM

## 2014-04-17 DIAGNOSIS — R635 Abnormal weight gain: Secondary | ICD-10-CM

## 2014-04-17 DIAGNOSIS — N189 Chronic kidney disease, unspecified: Secondary | ICD-10-CM

## 2014-04-17 NOTE — Assessment & Plan Note (Signed)
Continue metformin 500mg  qd, check CBG QOD, A1C in 2 months.

## 2014-04-17 NOTE — Assessment & Plan Note (Signed)
Last MMSE 25/30 (08/2013). Stable on Aricept without side effects. The current medical regimen is effective;  continue present plan and medications.

## 2014-04-17 NOTE — Assessment & Plan Note (Signed)
He is on Miralax and senna and reports loose stools. Will d/c the senna and monitor stool frequency and consistency

## 2014-04-17 NOTE — Progress Notes (Signed)
Patient ID: George Kemp, male   DOB: 04/28/1927, 80 y.o.   MRN: [id number]  Nursing Home Location:  [place] of Service: SNF [phone])  Chief Complaint  Patient presents with  . Medical Management of Chronic Issues    HPI:  This is a 80 y.o. male residing at [facility], skilled section. I am here to review his chronic medical issues. His HR has been slightly elevated at 112 today, ranging 90-112 over the past few weeks. He has been asymptomatic. He denies any CP, palpitations, or SOB. His blood sugars increased last month and his A1C was 8% so he was placed on a NCS diet and Metformin 500mg  daily. His blood sugars are in the 180's in the morning currently. He is also being treated for lesions on his scalp by derm with Aldava cream. He denies pain or drainage to this area.  He is demented but pleasant and able to answer q's. He propels in the wheelchair due to a hx of CVA with left sided weakness.    Review of Systems:  Review of Systems  Constitutional: Negative for chills, diaphoresis, activity change and appetite change.  HENT: Negative for congestion, rhinorrhea and trouble swallowing.   Eyes: Negative for discharge, itching and visual disturbance.  Respiratory: Negative for cough, chest tightness, shortness of breath and wheezing.   Cardiovascular: Positive for leg swelling. Negative for chest pain and palpitations.  Gastrointestinal: Negative for abdominal pain and abdominal distention.       Occasional loose stools  Endocrine: Negative for cold intolerance, heat intolerance, polydipsia and polyphagia.  Genitourinary:       Incontinent  Musculoskeletal: Negative for back pain and arthralgias.  Neurological: Positive for facial asymmetry and weakness. Negative for dizziness, tremors, speech difficulty, light-headedness and headaches.  Psychiatric/Behavioral: Positive for behavioral problems and confusion. Negative for agitation.     Medications: Patient's Medications  New Prescriptions   No medications on file  Previous Medications   ACETAMINOPHEN (TYLENOL) 325 MG TABLET    Take 650 mg by mouth every 6 (six) hours as needed for pain.   AMLODIPINE (NORVASC) 5 MG TABLET    Take 5 mg by mouth every morning.   ASPIRIN 81 MG TABLET    Take 81 mg by mouth daily.   CHOLECALCIFEROL (VITAMIN D) 1000 UNITS TABLET    Take 1,000 Units by mouth every morning.   CLOMIPRAMINE (ANAFRANIL) 25 MG CAPSULE    Take 25 mg by mouth every morning.   DONEPEZIL (ARICEPT) 10 MG TABLET    Take 10 mg by mouth daily.   LACTOSE FREE NUTRITION (BOOST PLUS) LIQD    Take 237 mLs by mouth. Supplement at bedtime   LATANOPROST (XALATAN) 0.005 % OPHTHALMIC SOLUTION    Place 1 drop into both eyes at bedtime.   LORATADINE (CLARITIN) 10 MG TABLET    Take 10 mg by mouth daily as needed. Take one tablet by mouth daily prn  As needed for allergies   METFORMIN (GLUCOPHAGE) 500 MG TABLET    Take 500 mg by mouth daily with breakfast.   METOPROLOL TARTRATE (LOPRESSOR) 25 MG TABLET    Take 25 mg by mouth 2 (two) times daily.    MULTIPLE VITAMIN (MULTIVITAMIN WITH MINERALS) TABS    Take 1 tablet by mouth daily.   POLYETHYLENE GLYCOL (MIRALAX / GLYCOLAX) PACKET    Take 17 g by mouth daily.   QUETIAPINE (SEROQUEL) 25 MG TABLET    Take  25 mg by mouth at bedtime.   TAMSULOSIN (FLOMAX) 0.4 MG CAPS    Take 0.4 mg by mouth daily after breakfast.  Modified Medications   No medications on file  Discontinued Medications   QUETIAPINE (SEROQUEL) 25 MG TABLET    Take 25 mg by mouth at bedtime. Every other hs   SENNA (SENOKOT) 8.6 MG TABS TABLET    Take 1 tablet by mouth at bedtime.     Physical Exam:  Filed Vitals:   04/17/14 1610  BP: 116/71  Pulse: 112  Temp: 97.1 F (36.2 C)  Resp: 20  Weight: 181 lb 4.8 oz (82.237 kg)    Physical Exam  Constitutional: No distress.  HENT:  Head: Normocephalic and atraumatic.  Eyes: Pupils are equal, round, and reactive  to light. Right eye exhibits no discharge.  Neck: Normal range of motion. No JVD present. No thyromegaly present.  Cardiovascular: Normal rate, regular rhythm and normal heart sounds.   No murmur heard. Edema +2 to left lower ext  Pulmonary/Chest: Effort normal and breath sounds normal. No respiratory distress.  Abdominal: Soft. Bowel sounds are normal. He exhibits no distension. There is no tenderness.  Musculoskeletal:  Despite flaccidity to the left sided he has good ROM to his left arm  Lymphadenopathy:    He has no cervical adenopathy.  Neurological: He is alert.  Oriented x2, left sided weakness  Skin: Skin is warm and dry. He is not diaphoretic. There is erythema.  Erythema to scalp. AKs  Psychiatric: Affect normal.    Labs reviewed/Significant Diagnostic Results:  Basic Metabolic Panel:  Recent Labs  06/27/13 12/05/13  NA 144 143  K 4.2 4.1  BUN 25* 137*  CREATININE 1.2 1.3   Liver Function Tests: No results for input(s): AST, ALT, ALKPHOS, BILITOT, PROT, ALBUMIN in the last 8760 hours. No results for input(s): LIPASE, AMYLASE in the last 8760 hours. No results for input(s): AMMONIA in the last 8760 hours. CBC:  Recent Labs  06/27/13 12/05/13  WBC 6.3 5.9  HGB 11.5* 11.8*  HCT 34* 34*  PLT 176 183   CBG: No results for input(s): GLUCAP in the last 8760 hours. TSH:  Recent Labs  06/27/13  TSH 1.03   A1C: Lab Results  Component Value Date   HGBA1C 7.2* 12/05/2013   Lipid Panel:  Recent Labs  06/27/13  CHOL 152  HDL 55  LDLCALC 78  TRIG 83       Assessment/Plan Diabetes mellitus with renal manifestation Continue metformin 500mg  qd, check CBG QOD, A1C in 2 months.  Constipation He is on Miralax and senna and reports loose stools. Will d/c the senna and monitor stool frequency and consistency  Mixed Alzheimer's and vascular dementia Last MMSE 25/30 (08/2013). Stable on Aricept without side effects. The current medical regimen is effective;   continue present plan and medications.   Tachycardia EKG to evaluate tachycardia/ rule out afib. Increase metoprolol to 25mg  BID. Check TSH next lab draw.  HTN (hypertension) Stable BP, see above.  Late effects of cerebrovascular disease Stable functional status, no new neuro issues. Begin ASA 81 mg qd secondary to hx of CVA and tachycardia today. He was at one point on plavix and  I am not sure why this was discontinued. Last CBC WNL.  Weight gain His blood sugars are elevated as well so this could be due to diet. However, I am going to check a TSH. If the TSH is normal would consider changing the Norvasc due to  some edema in his legs.     Labs/tests ordered TSH, EKG, A1C  Cindi Carbon, ANP [facility] [phone]

## 2014-04-18 DIAGNOSIS — R635 Abnormal weight gain: Secondary | ICD-10-CM | POA: Insufficient documentation

## 2014-04-18 NOTE — Assessment & Plan Note (Signed)
EKG to evaluate tachycardia/ rule out afib. Increase metoprolol to 25mg  BID. Check TSH next lab draw.

## 2014-04-18 NOTE — Assessment & Plan Note (Signed)
Stable BP, see above.

## 2014-04-18 NOTE — Assessment & Plan Note (Signed)
Stable functional status, no new neuro issues. Begin ASA 81 mg qd secondary to hx of CVA and tachycardia today. He was at one point on plavix and  I am not sure why this was discontinued. Last CBC WNL.

## 2014-04-18 NOTE — Assessment & Plan Note (Signed)
His blood sugars are elevated as well so this could be due to diet. However, I am going to check a TSH. If the TSH is normal would consider changing the Norvasc due to some edema in his legs.

## 2014-04-18 NOTE — Progress Notes (Signed)
Patient ID: George Kemp, male   DOB: [birth year], 80 y.o.   MRN: [id number] Add to HPI: On 02/25/14 surgical pathology report of lesions on scalp returned with a dx of squamous cell carcinoma. He is actively being tx by derm.  Cindi Carbon, ANP [facility] [phone]

## 2014-04-22 LAB — TSH: TSH: 1.73 u[IU]/mL (ref <=5.90)

## 2014-05-29 ENCOUNTER — Non-Acute Institutional Stay (SKILLED_NURSING_FACILITY): Payer: Medicare Other | Admitting: Adult Health

## 2014-05-29 DIAGNOSIS — R635 Abnormal weight gain: Secondary | ICD-10-CM

## 2014-05-29 DIAGNOSIS — C801 Malignant (primary) neoplasm, unspecified: Secondary | ICD-10-CM

## 2014-05-29 DIAGNOSIS — N189 Chronic kidney disease, unspecified: Secondary | ICD-10-CM

## 2014-05-29 DIAGNOSIS — R000 Tachycardia, unspecified: Secondary | ICD-10-CM

## 2014-05-29 DIAGNOSIS — IMO0002 Reserved for concepts with insufficient information to code with codable children: Secondary | ICD-10-CM

## 2014-05-29 DIAGNOSIS — I10 Essential (primary) hypertension: Secondary | ICD-10-CM

## 2014-05-29 DIAGNOSIS — N182 Chronic kidney disease, stage 2 (mild): Secondary | ICD-10-CM

## 2014-05-29 DIAGNOSIS — E1122 Type 2 diabetes mellitus with diabetic chronic kidney disease: Secondary | ICD-10-CM

## 2014-05-29 NOTE — Assessment & Plan Note (Signed)
Stable, continue to monitor  ?

## 2014-05-29 NOTE — Assessment & Plan Note (Signed)
Rate continues to be elevated at times 81-110.  EKG showed SR with 1st degree block (dose increased to BB at the time).  TSH ok. Increase Lopressor to 50 mg BID and check a CMP

## 2014-05-29 NOTE — Progress Notes (Signed)
Patient ID: George Kemp, male   DOB: 1927-01-08, 80 y.o.   MRN: [id number]   Nursing Home Location:  [place] of Service: SNF (31)   Code status: DNR  Chief Complaint  Patient presents with  . Medical Management of Chronic Issues    HPI:  This is a 80 y.o. male residing at [facility], skilled section. I am here to review his chronic medical issues. He has a hx of CVA with left hemiparesis, DM2, mixed dementia, HTN, BPH, and constipation. He had an elevated HR last month and an EKG was performed showing NSR. He has gained 9 lbs over the past few months and currently weighs 182 lbs. He is eating candy today during our visit. He is supposed to be on a NCS diet. He denies CP, SOB, or DOE. He reports some edema to the left side of his body.   He propels in the wheelchair due to a hx of CVA with left sided weakness.    Review of Systems:  Review of Systems  Constitutional: Negative for chills, diaphoresis, activity change and appetite change.  HENT: Negative for congestion, rhinorrhea and trouble swallowing.   Eyes: Negative for discharge, itching and visual disturbance.  Respiratory: Negative for cough, chest tightness, shortness of breath and wheezing.   Cardiovascular: Positive for leg swelling. Negative for chest pain and palpitations.  Gastrointestinal: Negative for abdominal pain, constipation and abdominal distention.       Occasional loose stools  Endocrine: Negative for cold intolerance, heat intolerance, polydipsia and polyphagia.  Genitourinary:       Incontinent  Musculoskeletal: Negative for back pain and arthralgias.  Skin: Negative for rash.  Neurological: Positive for facial asymmetry and weakness. Negative for dizziness, tremors, speech difficulty, light-headedness and headaches.  Psychiatric/Behavioral: Positive for confusion. Negative for behavioral problems and agitation.    Medications: Patient's Medications  New  Prescriptions   No medications on file  Previous Medications   ACETAMINOPHEN (TYLENOL) 325 MG TABLET    Take 650 mg by mouth every 6 (six) hours as needed for pain.   AMLODIPINE (NORVASC) 5 MG TABLET    Take 5 mg by mouth every morning.   ASPIRIN 81 MG TABLET    Take 81 mg by mouth daily.   CHOLECALCIFEROL (VITAMIN D) 1000 UNITS TABLET    Take 1,000 Units by mouth every morning.   CLOMIPRAMINE (ANAFRANIL) 25 MG CAPSULE    Take 25 mg by mouth every morning.   DONEPEZIL (ARICEPT) 10 MG TABLET    Take 10 mg by mouth daily.   IMIQUIMOD (ALDARA) 5 % CREAM    Apply topically 2 (two) times a week.   LACTOSE FREE NUTRITION (BOOST PLUS) LIQD    Take 237 mLs by mouth. Supplement at bedtime   LATANOPROST (XALATAN) 0.005 % OPHTHALMIC SOLUTION    Place 1 drop into both eyes at bedtime.   LORATADINE (CLARITIN) 10 MG TABLET    Take 10 mg by mouth daily as needed. Take one tablet by mouth daily prn  As needed for allergies   METFORMIN (GLUCOPHAGE) 500 MG TABLET    Take 500 mg by mouth daily with breakfast.   METOPROLOL TARTRATE (LOPRESSOR) 25 MG TABLET    Take 25 mg by mouth 2 (two) times daily.    MULTIPLE VITAMIN (MULTIVITAMIN WITH MINERALS) TABS    Take 1 tablet by mouth daily.   POLYETHYLENE GLYCOL (MIRALAX / GLYCOLAX) PACKET    Take 17 g by  mouth daily.   QUETIAPINE (SEROQUEL) 25 MG TABLET    Take 25 mg by mouth at bedtime.   TAMSULOSIN (FLOMAX) 0.4 MG CAPS    Take 0.4 mg by mouth daily after breakfast.  Modified Medications   No medications on file  Discontinued Medications   No medications on file     Physical Exam:  Filed Vitals:   05/29/14 1140  BP: 126/74  Pulse: 99  Temp: 98.3 F (36.8 C)  Resp: 20  Weight: 182 lb (82.555 kg)    Physical Exam  Constitutional: No distress.  HENT:  Head: Normocephalic and atraumatic.  Eyes: Pupils are equal, round, and reactive to light. Right eye exhibits no discharge.  Neck: Normal range of motion. No JVD present. No thyromegaly present.    Cardiovascular: Normal rate, regular rhythm and normal heart sounds.   No murmur heard. Edema +2 to left lower ext  Pulmonary/Chest: Effort normal and breath sounds normal. No respiratory distress.  Abdominal: Soft. Bowel sounds are normal. He exhibits no distension. There is no tenderness.  Musculoskeletal:  Despite flaccidity to the left sided he has good ROM to his left arm  Lymphadenopathy:    He has no cervical adenopathy.  Neurological: He is alert.  Oriented x2, left sided weakness  Skin: Skin is warm and dry. He is not diaphoretic. No erythema.  Improved scalp  Psychiatric: Affect normal.    Labs reviewed/Significant Diagnostic Results:  Basic Metabolic Panel:  Recent Labs  06/27/13 12/05/13 03/18/14  NA 144 143 143  K 4.2 4.1 4.2  BUN 25* 137* 18  CREATININE 1.2 1.3 1.3   Liver Function Tests: No results for input(s): AST, ALT, ALKPHOS, BILITOT, PROT, ALBUMIN in the last 8760 hours. No results for input(s): LIPASE, AMYLASE in the last 8760 hours. No results for input(s): AMMONIA in the last 8760 hours. CBC:  Recent Labs  06/27/13 12/05/13  WBC 6.3 5.9  HGB 11.5* 11.8*  HCT 34* 34*  PLT 176 183   CBG: No results for input(s): GLUCAP in the last 8760 hours. TSH:  Recent Labs  06/27/13 04/22/14  TSH 1.03 1.73   A1C: Lab Results  Component Value Date   HGBA1C 8.0* 03/18/2014   Lipid Panel:  Recent Labs  06/27/13  CHOL 152  HDL 55  LDLCALC 78  TRIG 83       Assessment/Plan Tachycardia Rate continues to be elevated at times 81-110.  EKG showed SR with 1st degree block (dose increased to BB at the time).  TSH ok. Increase Lopressor to 50 mg BID and check a CMP  Weight gain Weight has continued to trend upward. Will D/C Norvasc and monitor weight. Check CMP  Diabetes mellitus with renal manifestation Stable on Glucophage. CBGs range 150-182. A1C due next month.   Squamous cell carcinoma Scalped improved with Aldara cream per derm.  HTN  (hypertension) BP at goal. Labs ordered. See above plan  CKD (chronic kidney disease), stage II Stable, continue to monitor.     Cindi Carbon, ANP [facility] [phone]

## 2014-05-29 NOTE — Assessment & Plan Note (Signed)
Scalped improved with Aldara cream per derm.

## 2014-05-29 NOTE — Assessment & Plan Note (Signed)
Stable on Glucophage. CBGs range 150-182. A1C due next month.

## 2014-05-29 NOTE — Assessment & Plan Note (Signed)
BP at goal. Labs ordered. See above plan

## 2014-05-29 NOTE — Assessment & Plan Note (Signed)
Weight has continued to trend upward. Will D/C Norvasc and monitor weight. Check CMP

## 2014-06-03 LAB — CBC AND DIFFERENTIAL
HEMATOCRIT: 37 % — AB (ref 41–53)
Hemoglobin: 12.1 g/dL — AB (ref 13.5–17.5)
PLATELETS: 222 10*3/uL (ref 150–399)
WBC: 6.7 10^3/mL

## 2014-06-03 LAB — HEPATIC FUNCTION PANEL
ALK PHOS: 67 U/L (ref 25–125)
ALT: 21 U/L (ref 10–40)
AST: 19 U/L (ref 14–40)

## 2014-06-03 LAB — BASIC METABOLIC PANEL
BUN: 15 mg/dL (ref 4–21)
Creatinine: 1.2 mg/dL (ref 0.6–1.3)
Glucose: 141 mg/dL
Potassium: 4.3 mmol/L (ref 3.4–5.3)
Sodium: 142 mmol/L (ref 137–147)

## 2014-06-12 ENCOUNTER — Encounter (HOSPITAL_COMMUNITY): Payer: Self-pay | Admitting: Specialist

## 2014-06-24 LAB — HEMOGLOBIN A1C: Hgb A1c MFr Bld: 7.3 % — AB (ref 4.0–6.0)

## 2014-06-30 ENCOUNTER — Non-Acute Institutional Stay (SKILLED_NURSING_FACILITY): Payer: Medicare Other | Admitting: Adult Health

## 2014-06-30 DIAGNOSIS — I10 Essential (primary) hypertension: Secondary | ICD-10-CM

## 2014-06-30 DIAGNOSIS — F3178 Bipolar disorder, in full remission, most recent episode mixed: Secondary | ICD-10-CM

## 2014-06-30 DIAGNOSIS — G309 Alzheimer's disease, unspecified: Secondary | ICD-10-CM

## 2014-06-30 DIAGNOSIS — N400 Enlarged prostate without lower urinary tract symptoms: Secondary | ICD-10-CM

## 2014-06-30 DIAGNOSIS — K5900 Constipation, unspecified: Secondary | ICD-10-CM

## 2014-06-30 DIAGNOSIS — E1122 Type 2 diabetes mellitus with diabetic chronic kidney disease: Secondary | ICD-10-CM

## 2014-06-30 DIAGNOSIS — N189 Chronic kidney disease, unspecified: Secondary | ICD-10-CM

## 2014-06-30 DIAGNOSIS — R635 Abnormal weight gain: Secondary | ICD-10-CM

## 2014-06-30 DIAGNOSIS — F0150 Vascular dementia without behavioral disturbance: Secondary | ICD-10-CM

## 2014-06-30 DIAGNOSIS — R000 Tachycardia, unspecified: Secondary | ICD-10-CM

## 2014-06-30 DIAGNOSIS — I6990 Unspecified sequelae of unspecified cerebrovascular disease: Secondary | ICD-10-CM

## 2014-06-30 DIAGNOSIS — F0280 Dementia in other diseases classified elsewhere without behavioral disturbance: Secondary | ICD-10-CM

## 2014-06-30 NOTE — Progress Notes (Signed)
Patient ID: George Kemp, male   DOB: 1926/10/05, 81 y.o.   MRN: [id number]   Nursing Home Location:  [place] of Service: SNF (31)   Code status: DNR  Chief Complaint  Patient presents with  . Medical Management of Chronic Issues    HPI:  This is a 81 y.o. male residing at [facility], skilled section. I am here to review his chronic medical issues. He has a hx of CVA with left hemiparesis, DM2, mixed dementia, HTN, BPH, and constipation. He has squamous cell carcinoma to his scalp and is followed by Dermatology. The staff is applying Aldara cream to his scalp and this seems to help. He has no complaints today. His norvasc was discontinued due to fluid retention and his edema has improved and he has lost 3 lbs, currently at 179 lbs.   Review of Systems:  Review of Systems  Constitutional: Negative for chills, diaphoresis, activity change and appetite change.  HENT: Negative for congestion, rhinorrhea and trouble swallowing.   Eyes: Negative for discharge, itching and visual disturbance.  Respiratory: Negative for cough, chest tightness, shortness of breath and wheezing.   Cardiovascular: Positive for leg swelling. Negative for chest pain and palpitations.  Gastrointestinal: Negative for abdominal pain, constipation and abdominal distention.       Occasional loose stools  Endocrine: Negative for cold intolerance, heat intolerance, polydipsia and polyphagia.  Genitourinary:       Incontinent  Musculoskeletal: Negative for back pain and arthralgias.  Skin: Negative for rash.  Neurological: Positive for facial asymmetry and weakness. Negative for dizziness, tremors, speech difficulty, light-headedness and headaches.  Psychiatric/Behavioral: Positive for confusion. Negative for behavioral problems and agitation.    Medications: Patient's Medications  New Prescriptions   No medications on file  Previous Medications   ACETAMINOPHEN  (TYLENOL) 325 MG TABLET    Take 650 mg by mouth every 6 (six) hours as needed for pain.   ASPIRIN 81 MG TABLET    Take 81 mg by mouth daily.   CHOLECALCIFEROL (VITAMIN D) 1000 UNITS TABLET    Take 1,000 Units by mouth every morning.   CLOMIPRAMINE (ANAFRANIL) 25 MG CAPSULE    Take 25 mg by mouth every morning.   DONEPEZIL (ARICEPT) 10 MG TABLET    Take 10 mg by mouth daily.   IMIQUIMOD (ALDARA) 5 % CREAM    Apply topically 2 (two) times a week.   LACTOSE FREE NUTRITION (BOOST PLUS) LIQD    Take 237 mLs by mouth. Supplement at bedtime   LATANOPROST (XALATAN) 0.005 % OPHTHALMIC SOLUTION    Place 1 drop into both eyes at bedtime.   LORATADINE (CLARITIN) 10 MG TABLET    Take 10 mg by mouth daily as needed. Take one tablet by mouth daily prn  As needed for allergies   METFORMIN (GLUCOPHAGE) 500 MG TABLET    Take 500 mg by mouth daily with breakfast.   METOPROLOL TARTRATE (LOPRESSOR) 25 MG TABLET    Take 25 mg by mouth 2 (two) times daily.    MULTIPLE VITAMIN (MULTIVITAMIN WITH MINERALS) TABS    Take 1 tablet by mouth daily.   POLYETHYLENE GLYCOL (MIRALAX / GLYCOLAX) PACKET    Take 17 g by mouth daily.   QUETIAPINE (SEROQUEL) 25 MG TABLET    Take 25 mg by mouth at bedtime.   TAMSULOSIN (FLOMAX) 0.4 MG CAPS    Take 0.4 mg by mouth daily after breakfast.  Modified Medications   No  medications on file  Discontinued Medications   AMLODIPINE (NORVASC) 5 MG TABLET    Take 5 mg by mouth every morning.     Physical Exam:  Filed Vitals:   06/30/14 1548  BP: 124/79  Pulse: 103  Temp: 98.1 F (36.7 C)  Resp: 22  Weight: 179 lb 6.4 oz (81.375 kg)  SpO2: 100%    Physical Exam  Constitutional: No distress.  HENT:  Head: Normocephalic and atraumatic.  Eyes: Pupils are equal, round, and reactive to light. Right eye exhibits no discharge.  Neck: Normal range of motion. No JVD present. No thyromegaly present.  Cardiovascular: Normal rate, regular rhythm and normal heart sounds.   No murmur  heard. Edema +1 to left lower ext  Pulmonary/Chest: Effort normal and breath sounds normal. No respiratory distress.  Abdominal: Soft. Bowel sounds are normal. He exhibits no distension. There is no tenderness.  Musculoskeletal:  Despite flaccidity to the left sided he has good ROM to his left arm  Lymphadenopathy:    He has no cervical adenopathy.  Neurological: He is alert.  Oriented x2, left sided weakness  Skin: Skin is warm and dry. He is not diaphoretic. No erythema.  Improved scalp  Psychiatric: Affect normal.    Labs reviewed/Significant Diagnostic Results:  Basic Metabolic Panel:  Recent Labs  12/05/13 03/18/14 06/03/14  NA 143 143 142  K 4.1 4.2 4.3  BUN 137* 18 15  CREATININE 1.3 1.3 1.2   Liver Function Tests:  Recent Labs  06/03/14  AST 19  ALT 21  ALKPHOS 67   No results for input(s): LIPASE, AMYLASE in the last 8760 hours. No results for input(s): AMMONIA in the last 8760 hours. CBC:  Recent Labs  12/05/13 06/03/14  WBC 5.9 6.7  HGB 11.8* 12.1*  HCT 34* 37*  PLT 183 222   CBG: No results for input(s): GLUCAP in the last 8760 hours. TSH:  Recent Labs  04/22/14  TSH 1.73   A1C: Lab Results  Component Value Date   HGBA1C 7.3* 06/24/2014     Assessment/Plan  1. Late effects of cerebrovascular disease  Tolerating ASA, hx of IDA with GI bleed. Will monitor CBC regularly and staff to monitor BMs  2. Tachycardia Still slightly above goal, Increase Lopressor to 75 mg BID and hold for HR <60  3. Weight gain Improved with discontinuing Norvasc  4. Bipolar disorder, in full remission, most recent episode mixed Stable, continue current meds.  5. Mixed Alzheimer's and vascular dementia Continue Aricept, has maintained current level of function.   6. Type 2 diabetes mellitus with diabetic chronic kidney disease A1C at goal <8%, continue Metformin  7. Essential hypertension Stable, see above  8. Constipation, unspecified constipation  type Miralax changed to prn due to loose stools, no complaints today  9. BPH (benign prostatic hyperplasia) Continue Flomax, no UTIs in recent hx  Cindi Carbon, [place] [phone]

## 2014-07-10 ENCOUNTER — Encounter: Payer: Self-pay | Admitting: Adult Health

## 2014-07-10 ENCOUNTER — Non-Acute Institutional Stay (SKILLED_NURSING_FACILITY): Payer: Medicare Other | Admitting: Adult Health

## 2014-07-10 DIAGNOSIS — F0150 Vascular dementia without behavioral disturbance: Secondary | ICD-10-CM

## 2014-07-10 DIAGNOSIS — G309 Alzheimer's disease, unspecified: Secondary | ICD-10-CM

## 2014-07-10 DIAGNOSIS — N189 Chronic kidney disease, unspecified: Secondary | ICD-10-CM

## 2014-07-10 DIAGNOSIS — E1122 Type 2 diabetes mellitus with diabetic chronic kidney disease: Secondary | ICD-10-CM

## 2014-07-10 DIAGNOSIS — F0280 Dementia in other diseases classified elsewhere without behavioral disturbance: Secondary | ICD-10-CM

## 2014-07-10 NOTE — Progress Notes (Signed)
Patient ID: George Kemp, male   DOB: [birth year], 81 y.o.   MRN: [id number] Patient ID: George Kemp, male   DOB: 02/02/1927, 81 y.o.   MRN: [id number]   Nursing Home Location:  [place] of Service: SNF (31)   Code status: DNR  Chief Complaint  Patient presents with  . Acute Visit    confusion, elevated CBG    HPI:  This is a 81 y.o. male residing at [facility], skilled section. I was asked to see him today due to increased confusion and a CBG of 278. He has a hx of CVA with left hemiparesis, DM2, mixed dementia, HTN, BPH, and constipation. He apparently was on the phone with his daughter and confused his location. The staff has not noticed any other changes. His daughter reports that when his glucose increases he can become more confused. He is alert and afebrile today and at baseline for our visit. In regards to his CBGs they are recorded weekly, ranging 130-160, but they are only checked in the morning.   Review of Systems:  Review of Systems  Constitutional: Negative for chills, diaphoresis, activity change and appetite change.  HENT: Negative for congestion, rhinorrhea and trouble swallowing.   Eyes: Negative for discharge, itching and visual disturbance.  Respiratory: Negative for cough, chest tightness, shortness of breath and wheezing.   Cardiovascular: Positive for leg swelling. Negative for chest pain and palpitations.  Gastrointestinal: Negative for abdominal pain, constipation and abdominal distention.       Occasional loose stools  Endocrine: Negative for cold intolerance, heat intolerance, polydipsia and polyphagia.  Genitourinary:       Incontinent  Musculoskeletal: Negative for back pain and arthralgias.  Skin: Negative for rash.  Neurological: Positive for facial asymmetry and weakness. Negative for dizziness, tremors, speech difficulty, light-headedness and headaches.  Psychiatric/Behavioral: Positive for confusion.  Negative for behavioral problems and agitation.    Medications: Patient's Medications  New Prescriptions   No medications on file  Previous Medications   ACETAMINOPHEN (TYLENOL) 325 MG TABLET    Take 650 mg by mouth every 6 (six) hours as needed for pain.   ASPIRIN 81 MG TABLET    Take 81 mg by mouth daily.   CHOLECALCIFEROL (VITAMIN D) 1000 UNITS TABLET    Take 1,000 Units by mouth every morning.   CLOMIPRAMINE (ANAFRANIL) 25 MG CAPSULE    Take 25 mg by mouth every morning.   DONEPEZIL (ARICEPT) 10 MG TABLET    Take 10 mg by mouth daily.   IMIQUIMOD (ALDARA) 5 % CREAM    Apply topically 2 (two) times a week.   LACTOSE FREE NUTRITION (BOOST PLUS) LIQD    Take 237 mLs by mouth. Supplement at bedtime   LATANOPROST (XALATAN) 0.005 % OPHTHALMIC SOLUTION    Place 1 drop into both eyes at bedtime.   LORATADINE (CLARITIN) 10 MG TABLET    Take 10 mg by mouth daily as needed. Take one tablet by mouth daily prn  As needed for allergies   METFORMIN (GLUCOPHAGE) 500 MG TABLET    Take 500 mg by mouth 2 (two) times daily with a meal.    METOPROLOL TARTRATE (LOPRESSOR) 25 MG TABLET    Take 25 mg by mouth 2 (two) times daily.    MULTIPLE VITAMIN (MULTIVITAMIN WITH MINERALS) TABS    Take 1 tablet by mouth daily.   POLYETHYLENE GLYCOL (MIRALAX / GLYCOLAX) PACKET    Take 17 g by mouth daily.  QUETIAPINE (SEROQUEL) 25 MG TABLET    Take 25 mg by mouth at bedtime.   TAMSULOSIN (FLOMAX) 0.4 MG CAPS    Take 0.4 mg by mouth daily after breakfast.  Modified Medications   No medications on file  Discontinued Medications   No medications on file     Physical Exam:  Filed Vitals:   07/10/14 1240  BP: 144/64  Pulse: 83  Temp: 97.4 F (36.3 C)  Resp: 14    Physical Exam  Constitutional: No distress.  HENT:  Head: Normocephalic and atraumatic.  Eyes: Pupils are equal, round, and reactive to light. Right eye exhibits no discharge.  Neck: Normal range of motion. No JVD present. No thyromegaly present.    Cardiovascular: Normal rate, regular rhythm and normal heart sounds.   No murmur heard. Edema +1 to left lower ext  Pulmonary/Chest: Effort normal and breath sounds normal. No respiratory distress.  Abdominal: Soft. Bowel sounds are normal. He exhibits no distension. There is no tenderness.  Musculoskeletal:  Despite flaccidity to the left sided he has good ROM to his left arm  Lymphadenopathy:    He has no cervical adenopathy.  Neurological: He is alert.  Oriented x2, left sided weakness  Skin: Skin is warm and dry. He is not diaphoretic. No erythema.  Improved scalp  Psychiatric: Affect normal.    Labs reviewed/Significant Diagnostic Results:  Basic Metabolic Panel:  Recent Labs  12/05/13 03/18/14 06/03/14  NA 143 143 142  K 4.1 4.2 4.3  BUN 137* 18 15  CREATININE 1.3 1.3 1.2   Liver Function Tests:  Recent Labs  06/03/14  AST 19  ALT 21  ALKPHOS 67   No results for input(s): LIPASE, AMYLASE in the last 8760 hours. No results for input(s): AMMONIA in the last 8760 hours. CBC:  Recent Labs  12/05/13 06/03/14  WBC 5.9 6.7  HGB 11.8* 12.1*  HCT 34* 37*  PLT 183 222   CBG: No results for input(s): GLUCAP in the last 8760 hours. TSH:  Recent Labs  04/22/14  TSH 1.73   A1C: Lab Results  Component Value Date   HGBA1C 7.3* 06/24/2014     Assessment/Plan  1. Mixed Alzheimer's and vascular dementia Stable, only one episode of increased, confusion. Continue to monitor VS qshift for 72 hrs.   2. Type 2 diabetes mellitus with diabetic chronic kidney disease Increase Metformin to 500mg  BID and monitor CBGs BID   Cindi Carbon, ANP [facility] [phone]

## 2014-07-16 ENCOUNTER — Non-Acute Institutional Stay (SKILLED_NURSING_FACILITY): Payer: Medicare Other | Admitting: Internal Medicine

## 2014-07-16 ENCOUNTER — Encounter: Payer: Self-pay | Admitting: Internal Medicine

## 2014-07-16 DIAGNOSIS — F0150 Vascular dementia without behavioral disturbance: Secondary | ICD-10-CM | POA: Diagnosis not present

## 2014-07-16 DIAGNOSIS — G309 Alzheimer's disease, unspecified: Secondary | ICD-10-CM | POA: Diagnosis not present

## 2014-07-16 DIAGNOSIS — E1122 Type 2 diabetes mellitus with diabetic chronic kidney disease: Secondary | ICD-10-CM | POA: Diagnosis not present

## 2014-07-16 DIAGNOSIS — F3178 Bipolar disorder, in full remission, most recent episode mixed: Secondary | ICD-10-CM

## 2014-07-16 DIAGNOSIS — N189 Chronic kidney disease, unspecified: Secondary | ICD-10-CM

## 2014-07-16 DIAGNOSIS — F0280 Dementia in other diseases classified elsewhere without behavioral disturbance: Secondary | ICD-10-CM

## 2014-07-16 NOTE — Progress Notes (Signed)
Patient ID: George Kemp, male   DOB: 08-21-1926, 81 y.o.   MRN: [id number]  Location: [facility] Provider:  Sahith Nurse L. Mariea Clonts, D.O., C.M.D.  Code Status:  DNR Advanced Directive information Does patient have an advance directive?: Yes, Type of Advance Directive: [place];Living will, Does patient want to make changes to advanced directive?: No - Patient declined  Chief Complaint  Patient presents with  . Acute Visit    increased confusion, paranoid about wallet being stolen (did not have one) and won't give urine sample to his regular nurse--did not recognize her    HPI:  81 yo white male long term care resident of SNF with h/o mixed AD and vascular dementia, "bipolar disorder", prior stroke with left hemiplegia, DMII with renal manifestations, prior left hip fracture, urinary incontinence (functional), BPH, constipation and allergies was seen for an acute visit.  2/11, seen by NP Wert and noted that his confusion had some correlation with increased CBGs.  Metformin 500mg  was increased to bid as were cbgs.    He was noted to be confused on the early shift not recognizing his regular nurse.  He has been fine during this shift and provided a urine sample that he was eager for me to give to his nurse who noted he has been his usual self so fart this afternoon.  He has notable short term memory loss on my assessment, but did not currently display any paranoid delusions or hallucinations.  Of note, he is on seroquel (antipsychotic) for bipolar disorder along with aricept but no namenda for his dementia.  His urine sample was dipped and entirely clear.  He has no complaints whatsoever.  Review of his CBGs does show persistently elevated levels in the upper 100s with 1 in the lower 200s since the increase in his metformin.  He eats very well.      Review of Systems:  Review of Systems  Constitutional: Negative for fever, chills and malaise/fatigue.  HENT: Negative for  congestion.   Respiratory: Negative for cough and shortness of breath.   Cardiovascular: Negative for chest pain.  Gastrointestinal: Negative for abdominal pain.  Genitourinary: Negative for dysuria, urgency and frequency.  Musculoskeletal: Negative for falls.  Neurological: Negative for dizziness and weakness.  Endo/Heme/Allergies:       Diabetes  Psychiatric/Behavioral: Positive for memory loss. Negative for hallucinations.    Medications: Patient's Medications  New Prescriptions   No medications on file  Previous Medications   ACETAMINOPHEN (TYLENOL) 325 MG TABLET    Take 650 mg by mouth every 6 (six) hours as needed for pain.   ASPIRIN 81 MG TABLET    Take 81 mg by mouth daily.   CHOLECALCIFEROL (VITAMIN D) 1000 UNITS TABLET    Take 1,000 Units by mouth every morning.   CLOMIPRAMINE (ANAFRANIL) 25 MG CAPSULE    Take 25 mg by mouth every morning.   DONEPEZIL (ARICEPT) 10 MG TABLET    Take 10 mg by mouth daily.   IMIQUIMOD (ALDARA) 5 % CREAM    Apply topically 2 (two) times a week.   LACTOSE FREE NUTRITION (BOOST PLUS) LIQD    Take 237 mLs by mouth. Supplement at bedtime   LATANOPROST (XALATAN) 0.005 % OPHTHALMIC SOLUTION    Place 1 drop into both eyes at bedtime.   LORATADINE (CLARITIN) 10 MG TABLET    Take 10 mg by mouth daily as needed. Take one tablet by mouth daily prn  As needed for allergies  METFORMIN (GLUCOPHAGE) 500 MG TABLET    Take 500 mg by mouth 2 (two) times daily with a meal.    METOPROLOL TARTRATE (LOPRESSOR) 25 MG TABLET    Take 25 mg by mouth 2 (two) times daily.    MULTIPLE VITAMIN (MULTIVITAMIN WITH MINERALS) TABS    Take 1 tablet by mouth daily.   POLYETHYLENE GLYCOL (MIRALAX / GLYCOLAX) PACKET    Take 17 g by mouth daily.   QUETIAPINE (SEROQUEL) 25 MG TABLET    Take 25 mg by mouth at bedtime.   TAMSULOSIN (FLOMAX) 0.4 MG CAPS    Take 0.4 mg by mouth daily after breakfast.  Modified Medications   No medications on file  Discontinued Medications   No  medications on file    Physical Exam: Filed Vitals:   07/16/14 1513  BP: 124/75  Pulse: 86  Temp: 97.5 F (36.4 C)  Resp: 20  Weight: 178 lb 3.2 oz (80.831 kg)  SpO2: 97%   Physical Exam  Constitutional: He appears well-developed and well-nourished. No distress.  HENT:  Head: Normocephalic and atraumatic.  Cardiovascular: Normal rate, regular rhythm, normal heart sounds and intact distal pulses.   Pulmonary/Chest: Effort normal and breath sounds normal. No respiratory distress. He has no wheezes. He has no rales.  Abdominal: Bowel sounds are normal. He exhibits no distension.  Musculoskeletal:  Left hemiplegia, seated in wheelchair  Neurological: He is alert.  Oriented to person and place, not precise time  Skin: Skin is warm and dry.  Psychiatric:  Very anxious about providing urine sample    Labs reviewed: Basic Metabolic Panel:  Recent Labs  12/05/13 03/18/14 06/03/14  NA 143 143 142  K 4.1 4.2 4.3  BUN 137* 18 15  CREATININE 1.3 1.3 1.2    Liver Function Tests:  Recent Labs  06/03/14  AST 19  ALT 21  ALKPHOS 67    CBC:  Recent Labs  12/05/13 06/03/14  WBC 5.9 6.7  HGB 11.8* 12.1*  HCT 34* 37*  PLT 183 222   Lab Results  Component Value Date   HGBA1C 7.3* 06/24/2014  Urine dipstick 07/16/14:  Negative  Assessment/Plan 1. Mixed Alzheimer's and vascular dementia -suspect his paranoid delusions and hallucinations earlier were related to this vs. His "bipolar"--he is on seroquel -cont aricept -if he has ongoing difficulty with this, namenda XR may be helpful, plus the combination could benefit him long term  2. Type 2 diabetes mellitus with diabetic chronic kidney disease -glucose levels have not been high enough to cause paranoid delusions and hallucinations (all but one less than 200 since metformin increased to 500mg  po bid) -increase metformin to 850mg  po bid at this time to see if it does make a difference, but I think his psychosis is  dementia related -needs urine microalbumin at next routine, foot exam, ?eye exam last done -not on ace/arb  3. Bipolar disorder, in full remission, most recent episode mixed -continues on seroquel for this -also on clomipramine tca antidepressant--I wonder if he would do better on a newer agent (SSRI) w/o anticholinergic effects  Family/ staff Communication: discussed with his nurse, supervisor  Goals of care: long term care snf resident, has living will and hcpoa on file, DNR code status  Labs/tests ordered:  No new ordered today

## 2014-07-17 LAB — BASIC METABOLIC PANEL
BUN: 18 mg/dL (ref 4–21)
Creatinine: 1.2 mg/dL (ref 0.6–1.3)
Glucose: 139 mg/dL
Potassium: 4.1 mmol/L (ref 3.4–5.3)
Sodium: 140 mmol/L (ref 137–147)

## 2014-07-31 ENCOUNTER — Non-Acute Institutional Stay (SKILLED_NURSING_FACILITY): Payer: Medicare Other | Admitting: Adult Health

## 2014-07-31 DIAGNOSIS — E1122 Type 2 diabetes mellitus with diabetic chronic kidney disease: Secondary | ICD-10-CM | POA: Diagnosis not present

## 2014-07-31 DIAGNOSIS — F0150 Vascular dementia without behavioral disturbance: Secondary | ICD-10-CM

## 2014-07-31 DIAGNOSIS — N189 Chronic kidney disease, unspecified: Secondary | ICD-10-CM

## 2014-07-31 DIAGNOSIS — F0280 Dementia in other diseases classified elsewhere without behavioral disturbance: Secondary | ICD-10-CM

## 2014-07-31 DIAGNOSIS — F3178 Bipolar disorder, in full remission, most recent episode mixed: Secondary | ICD-10-CM | POA: Diagnosis not present

## 2014-07-31 DIAGNOSIS — G309 Alzheimer's disease, unspecified: Secondary | ICD-10-CM

## 2014-07-31 DIAGNOSIS — I10 Essential (primary) hypertension: Secondary | ICD-10-CM | POA: Diagnosis not present

## 2014-08-04 NOTE — Progress Notes (Signed)
Patient ID: George Kemp, male   DOB: Apr 12, 1927, 81 y.o.   MRN: [id number]   Nursing Home Location:  [place] of Service: SNF (31)   Code status: DNR  Chief Complaint  Patient presents with  . Medical Management of Chronic Issues    HPI:  This is a 81 y.o. male residing at [facility], skilled section.I am here to review his chronic medical issues.  He has a hx of CVA with left hemiparesis, DM2, mixed dementia, HTN, BPH, and constipation. Recently his blood sugars have been elevated and his Metformin was increased to 850 mg BID. He seems to be tolerating this well. He does report some loose stools occasionally but this is not a new issues. His blood sugars have improved, ranging 120-167. He is on clomipramine for a hx of bipolar, but possibly related to dementia with hallucinations, although the hx is not clear. Pharmacy suggested a trial dose reduction but this was tried in the past per the staff and failed with the resident having more hallucinations. There were some reports of increased confusion at our last visit but this has resolved.  Review of Systems:  Review of Systems  Constitutional: Negative for chills, diaphoresis, activity change and appetite change.  HENT: Negative for congestion, rhinorrhea and trouble swallowing.   Eyes: Negative for discharge, itching and visual disturbance.  Respiratory: Negative for cough, chest tightness, shortness of breath and wheezing.   Cardiovascular: Positive for leg swelling. Negative for chest pain and palpitations.  Gastrointestinal: Negative for abdominal pain, constipation and abdominal distention.       Occasional loose stools  Endocrine: Negative for cold intolerance, heat intolerance, polydipsia and polyphagia.  Genitourinary:       Incontinent  Musculoskeletal: Negative for back pain and arthralgias.  Skin: Negative for rash.  Neurological: Positive for facial asymmetry and weakness.  Negative for dizziness, tremors, speech difficulty, light-headedness and headaches.  Psychiatric/Behavioral: Positive for confusion. Negative for behavioral problems and agitation.    Medications: Patient's Medications  New Prescriptions   No medications on file  Previous Medications   ACETAMINOPHEN (TYLENOL) 325 MG TABLET    Take 650 mg by mouth every 6 (six) hours as needed for pain.   ASPIRIN 81 MG TABLET    Take 81 mg by mouth daily.   CHOLECALCIFEROL (VITAMIN D) 1000 UNITS TABLET    Take 1,000 Units by mouth every morning.   CLOMIPRAMINE (ANAFRANIL) 25 MG CAPSULE    Take 25 mg by mouth every morning.   DONEPEZIL (ARICEPT) 10 MG TABLET    Take 10 mg by mouth daily.   IMIQUIMOD (ALDARA) 5 % CREAM    Apply topically 2 (two) times a week.   LACTOSE FREE NUTRITION (BOOST PLUS) LIQD    Take 237 mLs by mouth. Supplement at bedtime   LATANOPROST (XALATAN) 0.005 % OPHTHALMIC SOLUTION    Place 1 drop into both eyes at bedtime.   LORATADINE (CLARITIN) 10 MG TABLET    Take 10 mg by mouth daily as needed. Take one tablet by mouth daily prn  As needed for allergies   METFORMIN (GLUCOPHAGE) 850 MG TABLET    Take 850 mg by mouth 2 (two) times daily with a meal.   METOPROLOL TARTRATE (LOPRESSOR) 25 MG TABLET    Take 25 mg by mouth 2 (two) times daily.    MULTIPLE VITAMIN (MULTIVITAMIN WITH MINERALS) TABS    Take 1 tablet by mouth daily.   POLYETHYLENE GLYCOL (MIRALAX /  GLYCOLAX) PACKET    Take 17 g by mouth daily.   QUETIAPINE (SEROQUEL) 25 MG TABLET    Take 25 mg by mouth at bedtime.   TAMSULOSIN (FLOMAX) 0.4 MG CAPS    Take 0.4 mg by mouth daily after breakfast.  Modified Medications   No medications on file  Discontinued Medications   METFORMIN (GLUCOPHAGE) 500 MG TABLET    Take 850 mg by mouth 2 (two) times daily with a meal.     Physical Exam:  Filed Vitals:   07/31/14 1652  BP: 137/78  Pulse: 80  Temp: 97.3 F (36.3 C)  Resp: 22  Weight: 176 lb 9.6 oz (80.105 kg)  SpO2: 100%     Physical Exam  Constitutional: No distress.  HENT:  Head: Normocephalic and atraumatic.  Eyes: Pupils are equal, round, and reactive to light. Right eye exhibits no discharge.  Neck: Normal range of motion. No JVD present. No thyromegaly present.  Cardiovascular: Normal rate, regular rhythm and normal heart sounds.   No murmur heard. Edema +1 to left lower ext  Pulmonary/Chest: Effort normal and breath sounds normal. No respiratory distress.  Abdominal: Soft. Bowel sounds are normal. He exhibits no distension. There is no tenderness.  Musculoskeletal:  Left arm weakness  Lymphadenopathy:    He has no cervical adenopathy.  Neurological: He is alert.  Oriented x2, left sided weakness  Skin: Skin is warm and dry. He is not diaphoretic. No erythema.  Improved scalp  Psychiatric: Affect normal.    Labs reviewed/Significant Diagnostic Results:  Basic Metabolic Panel:  Recent Labs  03/18/14 06/03/14 07/17/14  NA 143 142 140  K 4.2 4.3 4.1  BUN 18 15 18   CREATININE 1.3 1.2 1.2   Liver Function Tests:  Recent Labs  06/03/14  AST 19  ALT 21  ALKPHOS 67   No results for input(s): LIPASE, AMYLASE in the last 8760 hours. No results for input(s): AMMONIA in the last 8760 hours. CBC:  Recent Labs  12/05/13 06/03/14  WBC 5.9 6.7  HGB 11.8* 12.1*  HCT 34* 37*  PLT 183 222   CBG: No results for input(s): GLUCAP in the last 8760 hours. TSH:  Recent Labs  04/22/14  TSH 1.73   A1C: Lab Results  Component Value Date   HGBA1C 7.3* 06/24/2014     Assessment/Plan  1. Type 2 diabetes mellitus with diabetic chronic kidney disease Improved. Continue Metformin at 850mg  BID  2. Mixed Alzheimer's and vascular dementia Stable, he continues to be alert, f/c, answer q's. Currently on an ASA with no side effects. Continue current regimen.   3. Essential hypertension Controlled, periodically monitor BMP. BUN/Cr WNL.  4. Bipolar disorder, in full remission, most recent  episode mixed No change in meds, see HPI    Cindi Carbon, ANP [facility] [phone]

## 2014-08-04 NOTE — Progress Notes (Signed)
Patient ID: George Kemp, male DOB: 09-17-1926, 81 y.o. MRN: [id number]       [facility] SNF [phone])  Code Status: DNR  Contact Information   Name Relation Home Work Mobile   [place] Daughter [phone]  [phone]   [name], [name] [phone]  [phone]       Chief Complaint  Patient presents with  . Medical Management of Chronic Issues    HPI: This is a 81 y.o. male resident of [facility], Skilled Nursing section evaluated today for management of ongoing medical issues. He has a h/o of CVA with left sided hemiparesis and is non-ambulatory. Last month he was treated with 4 weeks of loratadine for allergic rhinitis related to pollen.   Last Visit  CKD (chronic kidney disease), stage II CKD stable. Slight bump in BUN from 21-25. 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 Hgb A1C 7.1 on 06/27/13. Currently he is not taking meds for this problem.   Dementia Stable. Currently on Aricept and tolerating well. Current MDS completed 08/02/13. BIMS score 10/15. PHQ-9 score is 0.   Anemia, iron deficiency hbg and hct are improved with Jan labs (H/H-11.5/34.2)  HTN Currently on Amlodipine and metoprolol. BP stable. Tolerating meds well. BMP acceptable.   Rhinitis Trial of loratadine last month. Pt denies rhinorrhea, sneezing, or cough    Since last visit patient has had not had any acute medical issues.   No Known Allergies  MEDICATIONS - Reviewed.   DATA REVIEWED  Laboratory Studies: Lab Results  Component Value Date   WBC 6.3 06/27/2013   HGB 11.5* 06/27/2013   HCT 34* 06/27/2013   MCV 95.1 12/06/2012   PLT 176 06/27/2013    Lab Results  Component Value Date   NA 144 06/27/2013   K 4.2 06/27/2013   GLU 137 06/27/2013   BUN 25* 06/27/2013   CREATININE 1.2 06/27/2013    Lab  Results  Component Value Date   HGBA1C 7.1* 06/27/2013   Lab Results  Component Value Date   CHOL 152 06/27/2013   HDL 55 06/27/2013   LDLCALC 78 06/27/2013   TRIG 83 06/27/2013   Lab Results  Component Value Date   TSH 1.03 06/27/2013    REVIEW OF SYSTEMS DATA OBTAINED: from patient, nurse, medical record GENERAL: Feels well No recent fever, fatigue, change in activity status, appetite, or weight  EYES: RESPIRATORY: Dry cough, stuffy nose No wheezing, SOB CARDIAC: No chest pain, palpitations. No edema GI: No abdominal pain No Nausea,vomiting,diarrhea or constipation No heartburn or reflux. Frequent bowel incontinence GU: Frequent incontinence urine  MUSCULOSKELETAL: No joint pain, No muscle ache, pain, weakness. No recent falls. Wheelchair, nonambulatory, left hemiparesis, lift for transfers NEUROLOGIC: No dizziness, fainting, headache, numbness. Dementia unchanged PSYCHIATRIC: No feelings of anxiety, depression Sleeps well.   PHYSICAL EXAM Filed Vitals:   10/18/13 1023  BP: 113/70  Pulse: 96  Temp: 97.4 F (36.3 C)  Resp: 24  Weight: 170 lb (77.111 kg)  SpO2: 98%   Body mass index is 23.72 kg/(m^2).  GENERAL APPEARANCE: No acute distress, appropriately groomed, normal body habitus Alert, pleasant, conversant. SKIN: No diaphoresis, rash, wound HEAD: Normocephalic, atraumatic EYES: Conjunctiva/lids clear, IOL bilateral  RESPIRATORY: Breathing is even, unlabored Lung sounds are clear and full. Dry cough during exam, voice is raspy CARDIOVASCULAR: Heart RRR No murmur or extra heart sounds, Bilateral radial and pedal pulses  EDEMA: No peripheral edema  GI: soft, nontender, positive bowel sounds 4 quadrants  MUSCULOSKELETAL: moves rt upper and lower extremity well. Sensation present on left, no movement from previous CVA, nontender NEUROLOGIC: Oriented to time, place,  person. Cranial nerves 2-12 grossly intact, speech clear, no tremor. Repeats himself  PSYCHIATRIC: Mood and affect appropriate to situation.   ASSESSMENT/PLAN  HTN (hypertension) BP stable on current meds. Continue and follow BMP in July.  Mixed Alzheimer's and vascular dementia Most recent MDS review 07/2013: BIMS 10/15, PHQ-9 0/27. No mood or behavior issues identified. Functional status: Extensive assist with all ADLs except eating, frequently incontinent of bowel and bladder. Verbal skills intact, participates in activities, visits with spouse daily, family remains involved. Continue medication.    CKD (chronic kidney disease), stage II Stable. Will continue to monitor at regular intervals.  Anemia, iron deficiency Stable. Will recheck CBC in July  Diabetes mellitus with renal manifestation A1C stable without medications. Will continue to monitor.   Basal cell carcinoma of other specified sites of skin Previous hx on left preauricular area. He currently has a suspicious lesion on the top of his head. Will refer to facility Dermatologist.  Rhinitis due to pollen Resolved.    Follow up: Routine follow up or as needed  [name], NP-C/Christina Melvyn Novas RN, MSN [facility] [phone]  10/18/2013              HTN (hypertension) - [name] at 10/18/2013 2:54 PM     Status: Written Related Problem: HTN (hypertension)   Expand All Collapse All   BP stable on current meds. Continue and follow BMP in July.            Mixed Alzheimer's and vascular dementia - [name] at 10/18/2013 2:56 PM     Status: Written Related Problem: Mixed Alzheimer's and vascular dementia   Expand All Collapse All   Most recent MDS review 07/2013: BIMS 10/15, PHQ-9 0/27. No mood or behavior issues identified. Functional status: Extensive assist with all ADLs except eating, frequently incontinent of bowel and bladder. Verbal skills intact,  participates in activities, visits with spouse daily, family remains involved. Continue medication.              CKD (chronic kidney disease), stage II - [name] at 10/18/2013 2:57 PM     Status: Written Related Problem: CKD (chronic kidney disease), stage II   Expand All Collapse All   Stable. Will continue to monitor at regular intervals.            Anemia, iron deficiency - [name] at 10/18/2013 2:58 PM     Status: Written Related Problem: Anemia, iron deficiency   Expand All Collapse All   Stable. Will recheck CBC in July            Diabetes mellitus with renal manifestation - [name] at 10/18/2013 2:59 PM     Status: Written Related Problem: Diabetes mellitus with renal manifestation   Expand All Collapse All   A1C stable without medications. Will continue to monitor.             Basal cell carcinoma of other specified sites of skin - [name] at 10/18/2013 3:28 PM     Status: Written Related Problem: Basal cell carcinoma of other specified sites of skin   Expand All Collapse All   Previous hx on left preauricular area. He currently has a suspicious lesion on the top of his head. Will refer to facility Dermatologist.  Rhinitis due to pollen - [name] at 10/18/2013 3:29 PM     Status: Written Related Problem: Rhinitis due to pollen   Expand All Collapse All   Resolved.

## 2014-08-05 IMAGING — CR DG HIP 1V PORT*L*
1 series · 1 of 1 positions shown · non-contrast
Comparison: Radiographs dated 11/30/2012

CLINICAL DATA: Left hip fracture.

PORTABLE LEFT HIP - 1 VIEW

[AP]
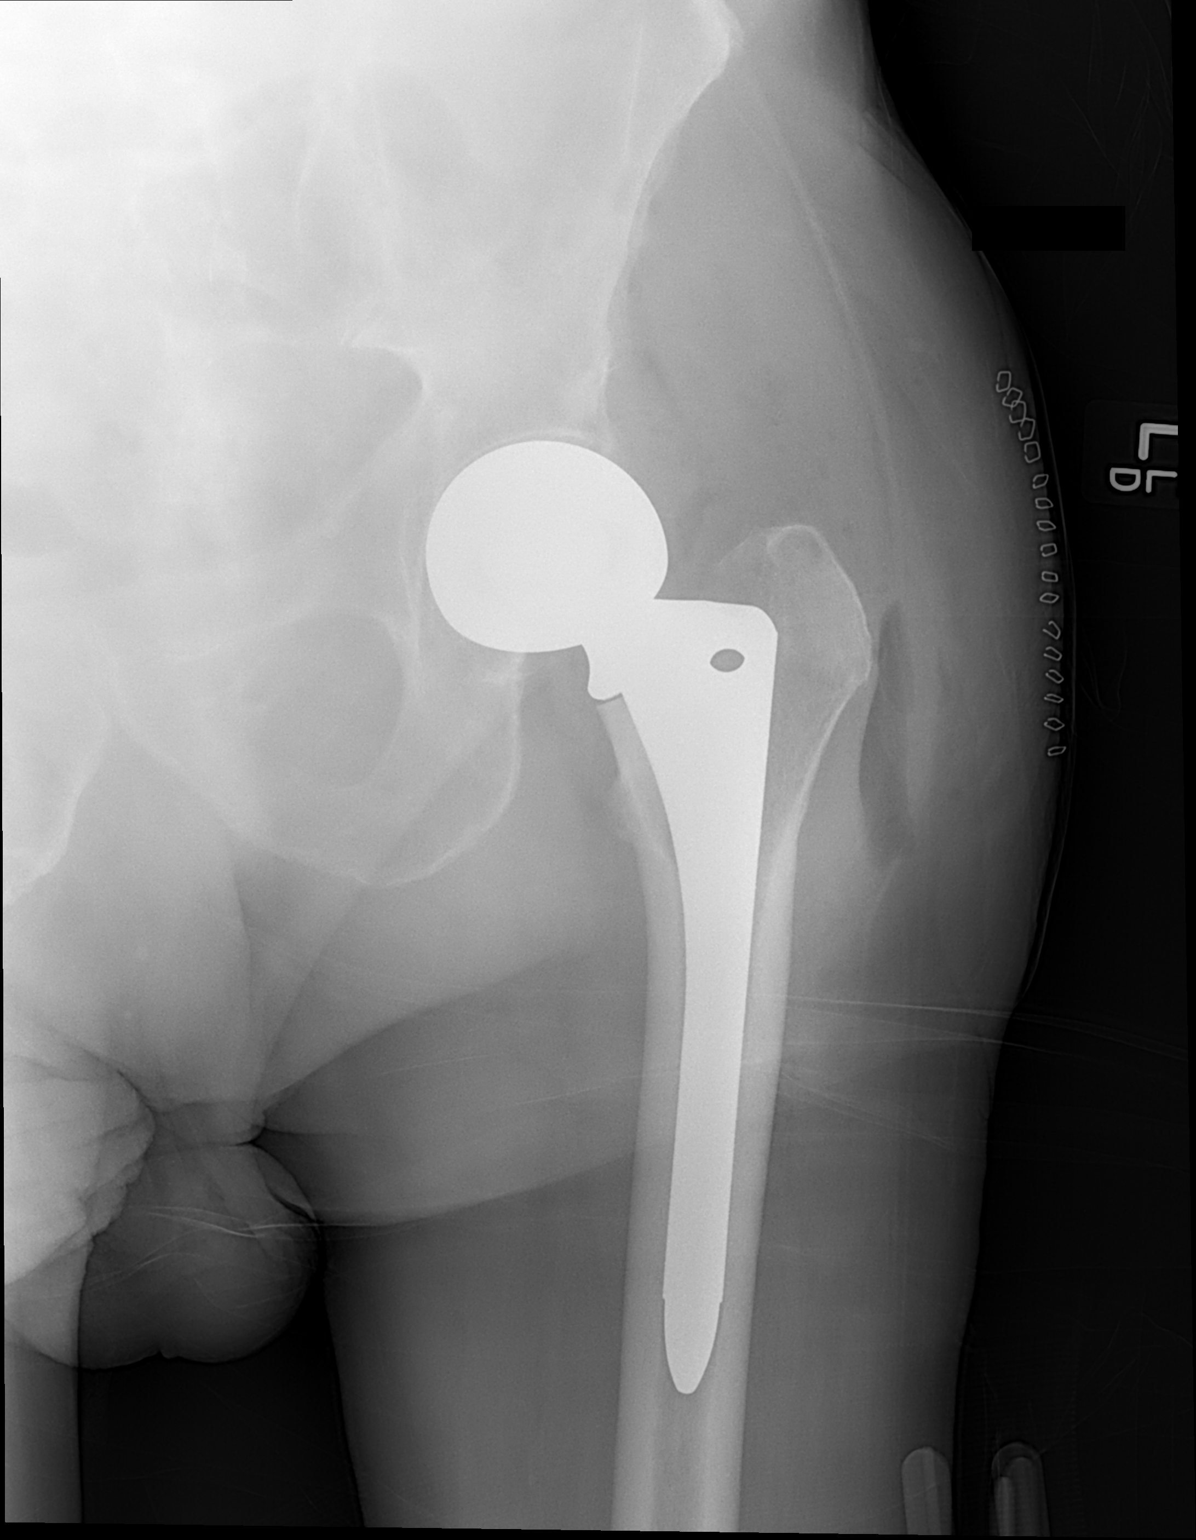

[1 of 1 positions shown; findings below may reference images not displayed]

FINDINGS: The patient has undergone left hemiarthroplasty.  The
proximal left femoral prosthesis appears in excellent position in
the AP projection.  No fracture.
IMPRESSION: Satisfactory appearance of the left hip in the AP projection after
left hemiarthroplasty.

## 2014-08-14 ENCOUNTER — Encounter: Payer: Self-pay | Admitting: Adult Health

## 2014-08-14 ENCOUNTER — Non-Acute Institutional Stay (SKILLED_NURSING_FACILITY): Payer: Medicare Other | Admitting: Adult Health

## 2014-08-14 DIAGNOSIS — F3170 Bipolar disorder, currently in remission, most recent episode unspecified: Secondary | ICD-10-CM | POA: Diagnosis not present

## 2014-08-14 DIAGNOSIS — F411 Generalized anxiety disorder: Secondary | ICD-10-CM | POA: Diagnosis not present

## 2014-08-14 NOTE — Progress Notes (Signed)
Patient ID: NAVEED HUMPHRES, male   DOB: 10-05-1926, 81 y.o.   MRN: [id number]   Nursing Home Location:  [place] of Service: SNF (31)   Code status: DNR  Chief Complaint  Patient presents with  . Acute Visit    anxiety    HPI:  This is a 81 y.o. male residing at [facility], skilled section.  He has a hx of CVA with left hemiparesis, DM2, mixed dementia, HTN, BPH, and constipation.  He is on clomipramine for a hx of bipolar and also mixed dementia. He reports that he has had Bipolar "all of his life".  He has recently experienced anxiety in the evening.  His wife usually visit in the evening but she has not been able to and this has created anxiety. He reports that he is sleeping well and denies depression or mood swings.  Review of Systems:  Review of Systems  Constitutional: Negative for chills, diaphoresis, activity change and appetite change.  HENT: Negative for congestion, rhinorrhea and trouble swallowing.   Eyes: Negative for discharge, itching and visual disturbance.  Respiratory: Negative for cough, chest tightness, shortness of breath and wheezing.   Cardiovascular: Positive for leg swelling. Negative for chest pain and palpitations.  Gastrointestinal: Negative for abdominal pain, constipation and abdominal distention.       Occasional loose stools  Endocrine: Negative for cold intolerance, heat intolerance, polydipsia and polyphagia.  Genitourinary:       Incontinent  Musculoskeletal: Negative for back pain and arthralgias.  Skin: Negative for rash.  Neurological: Positive for facial asymmetry and weakness. Negative for dizziness, tremors, speech difficulty, light-headedness and headaches.  Psychiatric/Behavioral: Positive for confusion. Negative for behavioral problems and agitation.    Medications: Patient's Medications  New Prescriptions   No medications on file  Previous Medications   ACETAMINOPHEN (TYLENOL) 325  MG TABLET    Take 650 mg by mouth every 6 (six) hours as needed for pain.   ASPIRIN 81 MG TABLET    Take 81 mg by mouth daily.   CHOLECALCIFEROL (VITAMIN D) 1000 UNITS TABLET    Take 1,000 Units by mouth every morning.   CLOMIPRAMINE (ANAFRANIL) 25 MG CAPSULE    Take 25 mg by mouth every morning.   DONEPEZIL (ARICEPT) 10 MG TABLET    Take 10 mg by mouth daily.   IMIQUIMOD (ALDARA) 5 % CREAM    Apply topically 2 (two) times a week.   LACTOSE FREE NUTRITION (BOOST PLUS) LIQD    Take 237 mLs by mouth. Supplement at bedtime   LATANOPROST (XALATAN) 0.005 % OPHTHALMIC SOLUTION    Place 1 drop into both eyes at bedtime.   LORATADINE (CLARITIN) 10 MG TABLET    Take 10 mg by mouth daily as needed. Take one tablet by mouth daily prn  As needed for allergies   METFORMIN (GLUCOPHAGE) 850 MG TABLET    Take 850 mg by mouth 2 (two) times daily with a meal.   METOPROLOL TARTRATE (LOPRESSOR) 25 MG TABLET    Take 25 mg by mouth 2 (two) times daily.    MULTIPLE VITAMIN (MULTIVITAMIN WITH MINERALS) TABS    Take 1 tablet by mouth daily.   POLYETHYLENE GLYCOL (MIRALAX / GLYCOLAX) PACKET    Take 17 g by mouth daily.   QUETIAPINE (SEROQUEL) 25 MG TABLET    Take 25 mg by mouth at bedtime.   TAMSULOSIN (FLOMAX) 0.4 MG CAPS    Take 0.4 mg by mouth daily  after breakfast.  Modified Medications   No medications on file  Discontinued Medications   No medications on file     Physical Exam:  Filed Vitals:   08/14/14 1517  BP: 125/75  Pulse: 84  Temp: 97.2 F (36.2 C)  Resp: 18  Weight: 176 lb 9.6 oz (80.105 kg)  SpO2: 99%    Physical Exam  Constitutional: No distress.  HENT:  Head: Normocephalic and atraumatic.  Eyes: Pupils are equal, round, and reactive to light. Right eye exhibits no discharge.  Neck: Normal range of motion. No JVD present. No thyromegaly present.  Cardiovascular: Normal rate, regular rhythm and normal heart sounds.   No murmur heard. Edema +1 to left lower ext  Pulmonary/Chest: Effort  normal and breath sounds normal. No respiratory distress.  Abdominal: Soft. Bowel sounds are normal. He exhibits no distension. There is no tenderness.  Musculoskeletal:  Left arm weakness  Lymphadenopathy:    He has no cervical adenopathy.  Neurological: He is alert.  Oriented x2, left sided weakness  Skin: Skin is warm and dry. He is not diaphoretic. No erythema.  Psychiatric: Affect normal.    Labs reviewed/Significant Diagnostic Results:  Basic Metabolic Panel:  Recent Labs  03/18/14 06/03/14 07/17/14  NA 143 142 140  K 4.2 4.3 4.1  BUN 18 15 18   CREATININE 1.3 1.2 1.2   Liver Function Tests:  Recent Labs  06/03/14  AST 19  ALT 21  ALKPHOS 67   No results for input(s): LIPASE, AMYLASE in the last 8760 hours. No results for input(s): AMMONIA in the last 8760 hours. CBC:  Recent Labs  12/05/13 06/03/14  WBC 5.9 6.7  HGB 11.8* 12.1*  HCT 34* 37*  PLT 183 222   CBG: No results for input(s): GLUCAP in the last 8760 hours. TSH:  Recent Labs  04/22/14  TSH 1.73   A1C: Lab Results  Component Value Date   HGBA1C 7.3* 06/24/2014     Assessment/Plan  1. Bipolar disorder Continue current meds, no depressive or manic symptoms  2) Anxiety Noted in the evenings. The staff is attempting to schedule time with Ms. Igou, which seems to be the root of his anxiety. If this continues will consider prn Ativan    Cindi Carbon, [place] [phone]

## 2014-08-25 NOTE — Progress Notes (Signed)
This encounter was created in error - please disregard.

## 2014-08-25 NOTE — Addendum Note (Signed)
Addended by: Estill Dooms on: 08/25/2014 02:06 PM   Modules accepted: Level of Service, SmartSet

## 2014-09-17 ENCOUNTER — Encounter: Payer: Self-pay | Admitting: Internal Medicine

## 2014-09-17 ENCOUNTER — Non-Acute Institutional Stay (SKILLED_NURSING_FACILITY): Payer: Medicare Other | Admitting: Internal Medicine

## 2014-09-17 DIAGNOSIS — E1122 Type 2 diabetes mellitus with diabetic chronic kidney disease: Secondary | ICD-10-CM

## 2014-09-17 DIAGNOSIS — G309 Alzheimer's disease, unspecified: Secondary | ICD-10-CM

## 2014-09-17 DIAGNOSIS — K5900 Constipation, unspecified: Secondary | ICD-10-CM | POA: Diagnosis not present

## 2014-09-17 DIAGNOSIS — N182 Chronic kidney disease, stage 2 (mild): Secondary | ICD-10-CM

## 2014-09-17 DIAGNOSIS — F3170 Bipolar disorder, currently in remission, most recent episode unspecified: Secondary | ICD-10-CM

## 2014-09-17 DIAGNOSIS — F0150 Vascular dementia without behavioral disturbance: Secondary | ICD-10-CM

## 2014-09-17 DIAGNOSIS — F0280 Dementia in other diseases classified elsewhere without behavioral disturbance: Secondary | ICD-10-CM | POA: Diagnosis not present

## 2014-09-17 DIAGNOSIS — I10 Essential (primary) hypertension: Secondary | ICD-10-CM

## 2014-09-17 DIAGNOSIS — N189 Chronic kidney disease, unspecified: Secondary | ICD-10-CM

## 2014-09-17 NOTE — Progress Notes (Signed)
Patient ID: George Kemp, male   DOB: 20-Oct-1926, 81 y.o.   MRN: [id number]  Location:  [facility] Provider:  Jonelle Sidle L. Mariea Clonts, D.O., C.M.D.  Code Status:  DNR Goals of care:Advanced Directive information Does patient have an advance directive?: Yes, Type of Advance Directive: Living will;Out of facility DNR (pink MOST or yellow form), Pre-existing out of facility DNR order (yellow form or pink MOST form): Yellow form placed in chart (order not valid for inpatient use), Does patient want to make changes to advanced directive?: No - Patient declined   Chief Complaint  Patient presents with  . Medical Management of Chronic Issues    HPI:  81 yo white male with h/o stroke with left hemiparesis, DMII with CKD, iron deficiency anemia, dementia with psychosis, iron deficiency anemia, hyperlipidemia, erectile dysfunction, BPH, depression, hyperlipidemia, glaucoma, constipation seen for med mgt of chronic diseases.  He is doing well.  He loves being here.  He says there is plenty to keep him busy.  Tells me his wife just visited, but I had seen his daughter leaving, not his wife.  He's continued to have periods of confusion adn visual hallucinations of others in the room with him and has had several negative urine dipsticks as a result.  He was looking for his parents and sister one time.  He was having some loose stools 3/26 so stool softeners were held, but not problematic since.  AM CBGs are running 100-110, PM 260s-270s.  He is eating well.  He admits to being annoyed that his left arm does not work ever since his stroke and his left leg is "useless".  Uses wheelchair to get around.  His vision is not as good as it used to be due to his glaucoma.  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    Review of Systems:  Review of Systems  Constitutional:  Negative for fever, chills and malaise/fatigue.  HENT: Positive for hearing loss. Negative for congestion.   Eyes: Positive for blurred vision.  Respiratory: Negative for shortness of breath.   Cardiovascular: Negative for chest pain.  Gastrointestinal: Negative for abdominal pain and constipation.  Genitourinary: Negative for dysuria.  Musculoskeletal: Negative for falls.  Neurological: Positive for sensory change and focal weakness. Negative for dizziness.  Psychiatric/Behavioral: Positive for hallucinations and memory loss.    Medications: Patient's Medications  New Prescriptions   No medications on file  Previous Medications   ACETAMINOPHEN (TYLENOL) 325 MG TABLET    Take 650 mg by mouth every 6 (six) hours as needed for pain.   ASPIRIN 81 MG TABLET    Take 81 mg by mouth daily.   CHOLECALCIFEROL (VITAMIN D) 1000 UNITS TABLET    Take 1,000 Units by mouth every morning.   CLOMIPRAMINE (ANAFRANIL) 25 MG CAPSULE    Take 25 mg by mouth every morning.   DONEPEZIL (ARICEPT) 10 MG TABLET    Take 10 mg by mouth daily.   IMIQUIMOD (ALDARA) 5 % CREAM    Apply topically 2 (two) times a week.   LACTOSE FREE NUTRITION (BOOST PLUS) LIQD    Take 237 mLs by mouth. Supplement at bedtime   LATANOPROST (XALATAN) 0.005 % OPHTHALMIC SOLUTION    Place 1 drop into both eyes at bedtime.   LORATADINE (CLARITIN) 10 MG TABLET  Take 10 mg by mouth daily as needed. Take one tablet by mouth daily prn  As needed for allergies   METFORMIN (GLUCOPHAGE) 850 MG TABLET    Take 850 mg by mouth 2 (two) times daily with a meal.   METOPROLOL TARTRATE (LOPRESSOR) 25 MG TABLET    Take 25 mg by mouth 2 (two) times daily.    MULTIPLE VITAMIN (MULTIVITAMIN WITH MINERALS) TABS    Take 1 tablet by mouth daily.   POLYETHYLENE GLYCOL (MIRALAX / GLYCOLAX) PACKET    Take 17 g by mouth daily.   QUETIAPINE (SEROQUEL) 25 MG TABLET    Take 25 mg by mouth at bedtime.   TAMSULOSIN (FLOMAX) 0.4 MG CAPS    Take 0.4 mg by mouth daily  after breakfast.  Modified Medications   No medications on file  Discontinued Medications   No medications on file    Physical Exam: Filed Vitals:   09/17/14 1529  BP: 109/69  Pulse: 93  Temp: 97 F (36.1 C)  Resp: 18  Weight: 172 lb 6.4 oz (78.2 kg)  SpO2: 99%  Physical Exam  Constitutional: He is oriented to person, place, and time. He appears well-developed and well-nourished. No distress.  Thin white male seated in his high backed wheelchair  Cardiovascular: Normal rate, regular rhythm, normal heart sounds and intact distal pulses.   Pulmonary/Chest: Effort normal and breath sounds normal. No respiratory distress.  Abdominal: Soft. Bowel sounds are normal.  Musculoskeletal: He exhibits no edema or tenderness.  Left hemiparesis  Neurological: He is alert and oriented to person, place, and time.  Skin: Skin is warm and dry.  Psychiatric: He has a normal mood and affect.    Labs reviewed: Basic Metabolic Panel:  Recent Labs  03/18/14 06/03/14 07/17/14  NA 143 142 140  K 4.2 4.3 4.1  BUN 18 15 18   CREATININE 1.3 1.2 1.2    Liver Function Tests:  Recent Labs  06/03/14  AST 19  ALT 21  ALKPHOS 67    CBC:  Recent Labs  12/05/13 06/03/14  WBC 5.9 6.7  HGB 11.8* 12.1*  HCT 34* 37*  PLT 183 222    Assessment/Plan 1. Type 2 diabetes mellitus with diabetic chronic kidney disease -cont current diabetes regimen which has shown some improvement especially with fasting glucose; evening glucose remains slightly elevated despite increased metformin  2. Mixed Alzheimer's and vascular dementia -cont aricept, not on namenda--could possibly benefit from this if he has not had difficulty tolerating it before   3. Bipolar affective disorder in remission -moods are good, but is having some hallucinations, cont on seroquel for this  4. Essential hypertension -bp at goal with lopressor  5. Constipation, unspecified constipation type -cont current bowel regimen which  is effective  6. CKD (chronic kidney disease), stage II -due to diabetes, vascular disease -is not on ace or arb--?why not  Family/ staff Communication: discussed with nursing  Labs/tests ordered: no new  Jahmire Ruffins L. Bertrice Leder, D.O. [facility] [address],  [address] Cell Phone (Mon-Fri 8am-5pm):  [phone] On Call:  [phone] & follow prompts after 5pm & weekends Office Phone:  [phone] Office Fax:  [phone]

## 2014-09-22 ENCOUNTER — Non-Acute Institutional Stay (SKILLED_NURSING_FACILITY): Payer: Medicare Other | Admitting: Adult Health

## 2014-09-22 ENCOUNTER — Encounter: Payer: Self-pay | Admitting: Adult Health

## 2014-09-22 DIAGNOSIS — H00013 Hordeolum externum right eye, unspecified eyelid: Secondary | ICD-10-CM | POA: Diagnosis not present

## 2014-09-22 NOTE — Progress Notes (Signed)
Patient ID: George Kemp, male   DOB: [birth year], 81 y.o.   MRN: [id number]    Nursing Home Location:  [facility]   Code Status: DNR   Place of Service: SNF (31)  Chief Complaint  Patient presents with  . Acute Visit    right eye erythema    HPI:  81 y.o. male residing at [facility], skilled section. I was asked to see him today due to erythema and swelling of the right eye. The symptoms have been present for 48 hrs. There is pain to the surrounding eye but not the the eye itself. There has been no change in vision or drainage.  He is afebrile.   Review of Systems:  Review of Systems  Constitutional: Negative for fever, chills, activity change, appetite change and unexpected weight change.  Eyes: Positive for redness. Negative for photophobia, pain, discharge, itching and visual disturbance.    Medications: Patient's Medications  New Prescriptions   No medications on file  Previous Medications   ACETAMINOPHEN (TYLENOL) 325 MG TABLET    Take 650 mg by mouth every 6 (six) hours as needed for pain.   ASPIRIN 81 MG TABLET    Take 81 mg by mouth daily.   CHOLECALCIFEROL (VITAMIN D) 1000 UNITS TABLET    Take 1,000 Units by mouth every morning.   CLOMIPRAMINE (ANAFRANIL) 25 MG CAPSULE    Take 25 mg by mouth every morning.   DONEPEZIL (ARICEPT) 10 MG TABLET    Take 10 mg by mouth daily.   IMIQUIMOD (ALDARA) 5 % CREAM    Apply topically 2 (two) times a week.   LACTOSE FREE NUTRITION (BOOST PLUS) LIQD    Take 237 mLs by mouth. Supplement at bedtime   LATANOPROST (XALATAN) 0.005 % OPHTHALMIC SOLUTION    Place 1 drop into both eyes at bedtime.   LORATADINE (CLARITIN) 10 MG TABLET    Take 10 mg by mouth daily as needed. Take one tablet by mouth daily prn  As needed for allergies   METFORMIN (GLUCOPHAGE) 850 MG TABLET    Take 850 mg by mouth 2 (two) times daily with a meal.   METOPROLOL TARTRATE (LOPRESSOR) 25 MG TABLET    Take 25 mg by mouth 2  (two) times daily.    MULTIPLE VITAMIN (MULTIVITAMIN WITH MINERALS) TABS    Take 1 tablet by mouth daily.   POLYETHYLENE GLYCOL (MIRALAX / GLYCOLAX) PACKET    Take 17 g by mouth daily.   QUETIAPINE (SEROQUEL) 25 MG TABLET    Take 25 mg by mouth at bedtime.   TAMSULOSIN (FLOMAX) 0.4 MG CAPS    Take 0.4 mg by mouth daily after breakfast.  Modified Medications   No medications on file  Discontinued Medications   No medications on file     Physical Exam:  Filed Vitals:   09/22/14 1503  BP: 117/74  Pulse: 97  Temp: 97.5 F (36.4 C)  Resp: 19    Physical Exam  Constitutional: No distress.  HENT:  Head: Normocephalic.  Eyes: Conjunctivae are normal. Pupils are equal, round, and reactive to light. Right eye exhibits no discharge. Left eye exhibits no discharge.  Right lower lid hordeolum noted. No drainage.  Slightly tender.    Lymphadenopathy:    He has no cervical adenopathy.  Neurological: He is alert.  Skin: Skin is warm and dry. He is not diaphoretic.  Psychiatric: Affect normal.    Labs reviewed/Significant Diagnostic Results:  Basic Metabolic Panel:  Recent Labs  03/18/14 06/03/14 07/17/14  NA 143 142 140  K 4.2 4.3 4.1  BUN 18 15 18   CREATININE 1.3 1.2 1.2   Liver Function Tests:  Recent Labs  06/03/14  AST 19  ALT 21  ALKPHOS 67   No results for input(s): LIPASE, AMYLASE in the last 8760 hours. No results for input(s): AMMONIA in the last 8760 hours. CBC:  Recent Labs  12/05/13 06/03/14  WBC 5.9 6.7  HGB 11.8* 12.1*  HCT 34* 37*  PLT 183 222   CBG: No results for input(s): GLUCAP in the last 8760 hours. TSH:  Recent Labs  04/22/14  TSH 1.73   A1C: Lab Results  Component Value Date   HGBA1C 7.3* 06/24/2014   Lipid Panel: No results for input(s): CHOL, HDL, LDLCALC, TRIG, CHOLHDL, LDLDIRECT in the last 8760 hours.     Assessment/Plan  1) Hordeolum  Warm compresses to the right eye 15 min TID for 48 hrs, if worsens with increased  swelling or drainage report to [place], [place] [phone]

## 2014-10-20 ENCOUNTER — Non-Acute Institutional Stay (SKILLED_NURSING_FACILITY): Payer: Medicare Other | Admitting: Adult Health

## 2014-10-20 ENCOUNTER — Encounter: Payer: Self-pay | Admitting: Adult Health

## 2014-10-20 DIAGNOSIS — N189 Chronic kidney disease, unspecified: Secondary | ICD-10-CM

## 2014-10-20 DIAGNOSIS — E1122 Type 2 diabetes mellitus with diabetic chronic kidney disease: Secondary | ICD-10-CM

## 2014-10-20 DIAGNOSIS — R634 Abnormal weight loss: Secondary | ICD-10-CM | POA: Diagnosis not present

## 2014-10-20 DIAGNOSIS — F0280 Dementia in other diseases classified elsewhere without behavioral disturbance: Secondary | ICD-10-CM

## 2014-10-20 DIAGNOSIS — I10 Essential (primary) hypertension: Secondary | ICD-10-CM | POA: Diagnosis not present

## 2014-10-20 DIAGNOSIS — F0150 Vascular dementia without behavioral disturbance: Secondary | ICD-10-CM

## 2014-10-20 DIAGNOSIS — G309 Alzheimer's disease, unspecified: Secondary | ICD-10-CM

## 2014-10-20 NOTE — Progress Notes (Signed)
Patient ID: George Kemp, male   DOB: 05-24-1927, 81 y.o.   MRN: [id number]   Nursing Home Location:  [facility] of Service: SNF (31)   Code status: DNR  Chief Complaint  Patient presents with  . Acute Visit    weight loss, decline in memory    HPI:  This is a 81 y.o. male residing at [facility], skilled section.  He has a hx of CVA with left hemiparesis, DM2, mixed dementia, HTN, BPH, and constipation.  The staff reports that he has declined functionally over the past two months. He has become more forgetful and not able to keep up with his schedule. He is wheelchair bound and propels himself through the facility. He has lost 12 lbs in the past 3 months. He has had more difficulty chewing food and is slower to feed himself. He is currently working with [facility] and is on a mech soft diet at supper but regular food other days. His last BIM score was 7/15 on 08/01/14.  There is no recent MMSE for review. He has difficulty with recall and remembering the date.  He is able to communicate his needs and understand his current situation. The staff thinks he seems sleepy at times and wonders if we can d/c the seroquel. He has failed attempts in the past to d/c chlomaprine.   He denies depression or anxiety but has a hx of bipolar. He scored a 0 on the Hartman Q9 His CBGs ranged 116-137 over the past month, currently on metformin  Review of Systems:  Review of Systems  Constitutional: Positive for activity change and appetite change. Negative for chills and diaphoresis.  HENT: Negative for congestion, rhinorrhea and trouble swallowing.        Trouble chewing food  Eyes: Negative for discharge, itching and visual disturbance.  Respiratory: Negative for cough, chest tightness, shortness of breath and wheezing.   Cardiovascular: Positive for leg swelling. Negative for chest pain and palpitations.  Gastrointestinal: Negative for abdominal pain, constipation and  abdominal distention.       Occasional loose stools  Endocrine: Negative for cold intolerance, heat intolerance, polydipsia and polyphagia.  Genitourinary: Negative for dysuria.       Incontinent  Musculoskeletal: Negative for back pain and arthralgias.  Skin: Negative for rash.  Neurological: Positive for facial asymmetry and weakness. Negative for dizziness, tremors, speech difficulty, light-headedness and headaches.  Psychiatric/Behavioral: Positive for confusion. Negative for behavioral problems and agitation.    Medications: Patient's Medications  New Prescriptions   No medications on file  Previous Medications   ACETAMINOPHEN (TYLENOL) 325 MG TABLET    Take 650 mg by mouth every 6 (six) hours as needed for pain.   ANTISEPTIC ORAL RINSE (BIOTENE) LIQD    15 mLs by Mouth Rinse route 4 (four) times daily.   ASPIRIN 81 MG TABLET    Take 81 mg by mouth daily.   CHOLECALCIFEROL (VITAMIN D) 1000 UNITS TABLET    Take 1,000 Units by mouth every morning.   CLOMIPRAMINE (ANAFRANIL) 25 MG CAPSULE    Take 25 mg by mouth every morning.   DONEPEZIL (ARICEPT) 10 MG TABLET    Take 10 mg by mouth daily.   IMIQUIMOD (ALDARA) 5 % CREAM    Apply topically 2 (two) times a week.   LACTOSE FREE NUTRITION (BOOST PLUS) LIQD    Take 237 mLs by mouth. Supplement at bedtime   LATANOPROST (XALATAN) 0.005 % OPHTHALMIC SOLUTION  Place 1 drop into both eyes at bedtime.   LORATADINE (CLARITIN) 10 MG TABLET    Take 10 mg by mouth daily as needed. Take one tablet by mouth daily prn  As needed for allergies   METFORMIN (GLUCOPHAGE) 850 MG TABLET    Take 850 mg by mouth 2 (two) times daily with a meal.   METOPROLOL TARTRATE (LOPRESSOR) 25 MG TABLET    Take 25 mg by mouth 2 (two) times daily.    MULTIPLE VITAMIN (MULTIVITAMIN WITH MINERALS) TABS    Take 1 tablet by mouth daily.   POLYETHYLENE GLYCOL (MIRALAX / GLYCOLAX) PACKET    Take 17 g by mouth daily.   SENNOSIDES-DOCUSATE SODIUM (SENOKOT-S) 8.6-50 MG TABLET     Take 1 tablet by mouth daily.   TAMSULOSIN (FLOMAX) 0.4 MG CAPS    Take 0.4 mg by mouth daily after breakfast.  Modified Medications   No medications on file  Discontinued Medications   QUETIAPINE (SEROQUEL) 25 MG TABLET    Take 25 mg by mouth at bedtime.     Physical Exam:  Filed Vitals:   10/20/14 1139  BP: 122/71  Pulse: 99  Temp: 98 F (36.7 C)  Resp: 18  Weight: 166 lb 3.2 oz (75.388 kg)  SpO2: 98%    Physical Exam  Constitutional: No distress.  frail  HENT:  Head: Normocephalic and atraumatic.  Eyes: Pupils are equal, round, and reactive to light. Right eye exhibits no discharge.  Neck: Normal range of motion. No JVD present. No thyromegaly present.  Cardiovascular: Normal rate, regular rhythm and normal heart sounds.   No murmur heard. Edema +1 to left lower ext  Pulmonary/Chest: Effort normal and breath sounds normal. No respiratory distress.  Abdominal: Soft. Bowel sounds are normal. He exhibits no distension. There is no tenderness.  Musculoskeletal:  Left arm weakness  Lymphadenopathy:    He has no cervical adenopathy.  Neurological: He is alert.  Oriented x2, left sided weakness. Able to f/c  Skin: Skin is warm and dry. He is not diaphoretic. No erythema.  Psychiatric: Affect normal.    Labs reviewed/Significant Diagnostic Results:  Basic Metabolic Panel:  Recent Labs  03/18/14 06/03/14 07/17/14  NA 143 142 140  K 4.2 4.3 4.1  BUN 18 15 18   CREATININE 1.3 1.2 1.2   Liver Function Tests:  Recent Labs  06/03/14  AST 19  ALT 21  ALKPHOS 67   No results for input(s): LIPASE, AMYLASE in the last 8760 hours. No results for input(s): AMMONIA in the last 8760 hours. CBC:  Recent Labs  12/05/13 06/03/14  WBC 5.9 6.7  HGB 11.8* 12.1*  HCT 34* 37*  PLT 183 222   CBG: No results for input(s): GLUCAP in the last 8760 hours. TSH:  Recent Labs  04/22/14  TSH 1.73   A1C: Lab Results  Component Value Date   HGBA1C 7.3* 06/24/2014      Assessment/Plan  1. Mixed Alzheimer's and vascular dementia Functional decline. Currently on Aricept. Consider adding Namenda. Will check for underlying cause of his functional decline. CBC, CMP, TSH. Discontinue seroquel and monitor for agitation  2. Type 2 diabetes mellitus with diabetic chronic kidney disease Stable, check A1C  3. Essential hypertension Stable on current meds  4. Loss of weight Change diet to mech soft with all meals. Continue to work with [place].  Check TSH due to weight loss.     Cindi Carbon, ANP [facility] [phone]

## 2014-10-21 LAB — TSH: TSH: 1.18 u[IU]/mL (ref 0.41–5.90)

## 2014-10-21 LAB — BASIC METABOLIC PANEL
BUN: 21 mg/dL (ref 4–21)
Creatinine: 1.4 mg/dL — AB (ref 0.6–1.3)
GLUCOSE: 114 mg/dL
Potassium: 4.5 mmol/L (ref 3.4–5.3)
Sodium: 140 mmol/L (ref 137–147)

## 2014-11-17 ENCOUNTER — Encounter: Payer: Self-pay | Admitting: Adult Health

## 2014-11-20 ENCOUNTER — Encounter: Payer: Self-pay | Admitting: Adult Health

## 2014-11-20 ENCOUNTER — Non-Acute Institutional Stay (SKILLED_NURSING_FACILITY): Payer: Medicare Other | Admitting: Adult Health

## 2014-11-20 DIAGNOSIS — N189 Chronic kidney disease, unspecified: Secondary | ICD-10-CM

## 2014-11-20 DIAGNOSIS — R634 Abnormal weight loss: Secondary | ICD-10-CM | POA: Diagnosis not present

## 2014-11-20 DIAGNOSIS — E1122 Type 2 diabetes mellitus with diabetic chronic kidney disease: Secondary | ICD-10-CM | POA: Diagnosis not present

## 2014-11-20 NOTE — Progress Notes (Signed)
Patient ID: George Kemp, male   DOB: [birth year], 81 y.o.   MRN: [id number]     Patient Care Team: Gayland Curry, DO as PCP - General (Geriatric Medicine) [facility] [place] Location:  [place]   Code Status: DNR    N Place of Service: SNF (31)  Chief Complaint  Patient presents with  . Acute Visit    elevated blood sugars    HPI: 81 y.o. male residing at [facility], skilled care section. I am here to review his blood sugars. Metformin was decreased to 250 mg BID due to weight loss on 11/03/14.  Since that time he has not gained more weight but the staff reports that he is eating 75% of his meals most of the time. He denies nausea, vomiting, diarrhea, or abd pain. His blood sugars have been 125-150 in the morning and over 200 several times in the evening. He is not compliant with a diabetic diet.   Review of Systems:  Review of Systems  Constitutional: Positive for unexpected weight change. Negative for fever, activity change, appetite change and fatigue.  Respiratory: Negative for cough and shortness of breath.   Cardiovascular: Positive for leg swelling. Negative for chest pain and palpitations.  Gastrointestinal: Negative for abdominal pain, constipation and abdominal distention.  Endocrine: Negative for polydipsia, polyphagia and polyuria.  Genitourinary: Negative for dysuria and difficulty urinating.  Psychiatric/Behavioral: Positive for confusion. Negative for behavioral problems and agitation.    Medications: Patient's Medications  New Prescriptions   No medications on file  Previous Medications   ACETAMINOPHEN (TYLENOL) 325 MG TABLET    Take 650 mg by mouth every 6 (six) hours as needed for pain.   ANTISEPTIC ORAL RINSE (BIOTENE) LIQD    15 mLs by Mouth Rinse route 4 (four) times daily.   ASPIRIN 81 MG TABLET    Take 81 mg by mouth daily.   CHOLECALCIFEROL (VITAMIN D) 1000 UNITS TABLET    Take  1,000 Units by mouth every morning.   CLOMIPRAMINE (ANAFRANIL) 25 MG CAPSULE    Take 25 mg by mouth every morning.   DONEPEZIL (ARICEPT) 10 MG TABLET    Take 10 mg by mouth daily.   IMIQUIMOD (ALDARA) 5 % CREAM    Apply topically 2 (two) times a week.   LACTOSE FREE NUTRITION (BOOST PLUS) LIQD    Take 237 mLs by mouth. Supplement at bedtime   LATANOPROST (XALATAN) 0.005 % OPHTHALMIC SOLUTION    Place 1 drop into both eyes at bedtime.   LORATADINE (CLARITIN) 10 MG TABLET    Take 10 mg by mouth daily as needed. Take one tablet by mouth daily prn  As needed for allergies   METOPROLOL TARTRATE (LOPRESSOR) 25 MG TABLET    Take 25 mg by mouth 2 (two) times daily.    MULTIPLE VITAMIN (MULTIVITAMIN WITH MINERALS) TABS    Take 1 tablet by mouth daily.   POLYETHYLENE GLYCOL (MIRALAX / GLYCOLAX) PACKET    Take 17 g by mouth daily.   SENNOSIDES-DOCUSATE SODIUM (SENOKOT-S) 8.6-50 MG TABLET    Take 1 tablet by mouth daily.   TAMSULOSIN (FLOMAX) 0.4 MG CAPS    Take 0.4 mg by mouth daily after breakfast.  Modified Medications   No medications on file  Discontinued Medications   METFORMIN (GLUCOPHAGE) 850 MG TABLET    Take 850 mg by mouth 2 (two) times daily with a meal.     Physical Exam:  There  were no vitals filed for this visit.  Physical Exam  Constitutional: No distress.  Cardiovascular: Normal rate and regular rhythm.   No murmur heard. Pulmonary/Chest: Effort normal and breath sounds normal. No respiratory distress. He has no wheezes.  Abdominal: Soft. Bowel sounds are normal. He exhibits no distension. There is no tenderness.  Neurological: He is alert.  Oriented x 2  Skin: Skin is warm and dry. He is not diaphoretic. No erythema.  Psychiatric: Affect normal.    Labs reviewed/Significant Diagnostic Results:  Basic Metabolic Panel:  Recent Labs  06/03/14 07/17/14 10/21/14  NA 142 140 140  K 4.3 4.1 4.5  BUN 15 18 21   CREATININE 1.2 1.2 1.4*   Liver Function Tests:  Recent  Labs  06/03/14  AST 19  ALT 21  ALKPHOS 67   No results for input(s): LIPASE, AMYLASE in the last 8760 hours. No results for input(s): AMMONIA in the last 8760 hours. CBC:  Recent Labs  12/05/13 06/03/14  WBC 5.9 6.7  HGB 11.8* 12.1*  HCT 34* 37*  PLT 183 222   CBG: No results for input(s): GLUCAP in the last 8760 hours. TSH:  Recent Labs  04/22/14 10/21/14  TSH 1.73 1.18   A1C: Lab Results  Component Value Date   HGBA1C 7.3* 06/24/2014   Lipid Panel: No results for input(s): CHOL, HDL, LDLCALC, TRIG, CHOLHDL, LDLDIRECT in the last 8760 hours.     Assessment/Plan   1. Type 2 diabetes mellitus with diabetic chronic kidney disease D/C Metformin Begin tradgenta 5 mg daily due to the fact that it is weight neutral  2. Loss of weight No improvement. Continue to monitor. Consider Remeron if weight loss continues Discussed this with his daughter, Ms. Windell Norfolk, [facility] [phone]

## 2014-11-20 NOTE — Progress Notes (Signed)
This encounter was created in error - please disregard.

## 2014-12-15 ENCOUNTER — Non-Acute Institutional Stay (SKILLED_NURSING_FACILITY): Payer: Medicare Other | Admitting: Adult Health

## 2014-12-15 DIAGNOSIS — F0280 Dementia in other diseases classified elsewhere without behavioral disturbance: Secondary | ICD-10-CM | POA: Diagnosis not present

## 2014-12-15 DIAGNOSIS — F0150 Vascular dementia without behavioral disturbance: Secondary | ICD-10-CM | POA: Diagnosis not present

## 2014-12-15 DIAGNOSIS — R634 Abnormal weight loss: Secondary | ICD-10-CM | POA: Diagnosis not present

## 2014-12-15 DIAGNOSIS — E1122 Type 2 diabetes mellitus with diabetic chronic kidney disease: Secondary | ICD-10-CM | POA: Diagnosis not present

## 2014-12-15 DIAGNOSIS — G309 Alzheimer's disease, unspecified: Secondary | ICD-10-CM | POA: Diagnosis not present

## 2014-12-15 DIAGNOSIS — N189 Chronic kidney disease, unspecified: Secondary | ICD-10-CM

## 2014-12-15 DIAGNOSIS — R05 Cough: Secondary | ICD-10-CM

## 2014-12-15 DIAGNOSIS — R059 Cough, unspecified: Secondary | ICD-10-CM

## 2014-12-15 DIAGNOSIS — D649 Anemia, unspecified: Secondary | ICD-10-CM | POA: Diagnosis not present

## 2014-12-16 LAB — BASIC METABOLIC PANEL
BUN: 28 mg/dL — AB (ref 4–21)
CREATININE: 1.5 mg/dL — AB (ref 0.6–1.3)
GLUCOSE: 87 mg/dL
Potassium: 4.2 mmol/L (ref 3.4–5.3)
Sodium: 143 mmol/L (ref 137–147)

## 2014-12-16 LAB — HEMOGLOBIN A1C: Hgb A1c MFr Bld: 6.7 % — AB (ref 4.0–6.0)

## 2014-12-16 LAB — CBC AND DIFFERENTIAL
HCT: 30 % — AB (ref 41–53)
Hemoglobin: 10 g/dL — AB (ref 13.5–17.5)
Platelets: 180 10*3/uL (ref 150–399)
WBC: 7.2 10*3/mL

## 2014-12-22 DIAGNOSIS — R634 Abnormal weight loss: Secondary | ICD-10-CM | POA: Insufficient documentation

## 2014-12-22 NOTE — Progress Notes (Signed)
Patient ID: George Kemp, male   DOB: [birth year], 81 y.o.   MRN: [id number]     Patient Care Team: Gayland Curry, DO as PCP - General (Geriatric Medicine) [facility] Location:  [facility]   Code Status: DNR    N Place of Service: SNF (31)  Chief Complaint  Patient presents with  . Acute Visit    f/u DM, weight loss    HPI: 81 y.o. male residing at [facility], skilled care section. I am here to review his blood sugars and weight loss. He was originally on metformin but this was discontinued due to weight loss. His insurance would not cover tradjenta so he was started glucotrol and seems to be tolerating it well. He denies any stomach upset and his blood sugars have ranged 84-152.  He continues to lose weight, currently down 4 lbs in the past month. He continues to participate in all activities but overall the staff has noticed that he is more confused over time.    Review of Systems:  Review of Systems  Constitutional: Positive for unexpected weight change. Negative for fever, activity change, appetite change and fatigue.  HENT: Negative for congestion and rhinorrhea.   Respiratory: Positive for cough. Negative for shortness of breath.   Cardiovascular: Positive for leg swelling. Negative for chest pain and palpitations.  Gastrointestinal: Negative for abdominal pain, constipation and abdominal distention.  Endocrine: Negative for polydipsia, polyphagia and polyuria.  Genitourinary: Negative for dysuria and difficulty urinating.  Psychiatric/Behavioral: Positive for confusion. Negative for behavioral problems and agitation.    Medications: Patient's Medications  New Prescriptions   No medications on file  Previous Medications   ACETAMINOPHEN (TYLENOL) 325 MG TABLET    Take 650 mg by mouth every 6 (six) hours as needed for pain.   ANTISEPTIC ORAL RINSE (BIOTENE) LIQD    15 mLs by Mouth Rinse route 4  (four) times daily.   ASPIRIN 81 MG TABLET    Take 81 mg by mouth daily.   CHOLECALCIFEROL (VITAMIN D) 1000 UNITS TABLET    Take 1,000 Units by mouth every morning.   CLOMIPRAMINE (ANAFRANIL) 25 MG CAPSULE    Take 25 mg by mouth every morning.   DONEPEZIL (ARICEPT) 10 MG TABLET    Take 10 mg by mouth daily.   GLIPIZIDE (GLUCOTROL) 5 MG TABLET    Take 2.5 mg by mouth daily before breakfast.   IMIQUIMOD (ALDARA) 5 % CREAM    Apply topically 2 (two) times a week.   LACTOSE FREE NUTRITION (BOOST PLUS) LIQD    Take 237 mLs by mouth. Supplement at bedtime   LATANOPROST (XALATAN) 0.005 % OPHTHALMIC SOLUTION    Place 1 drop into both eyes at bedtime.   LORATADINE (CLARITIN) 10 MG TABLET    Take 10 mg by mouth daily as needed. Take one tablet by mouth daily prn  As needed for allergies   METOPROLOL TARTRATE (LOPRESSOR) 25 MG TABLET    Take 25 mg by mouth 2 (two) times daily.    MULTIPLE VITAMIN (MULTIVITAMIN WITH MINERALS) TABS    Take 1 tablet by mouth daily.   POLYETHYLENE GLYCOL (MIRALAX / GLYCOLAX) PACKET    Take 17 g by mouth daily.   SENNOSIDES-DOCUSATE SODIUM (SENOKOT-S) 8.6-50 MG TABLET    Take 1 tablet by mouth daily.   TAMSULOSIN (FLOMAX) 0.4 MG CAPS    Take 0.4 mg by mouth daily after breakfast.  Modified Medications  No medications on file  Discontinued Medications   No medications on file     Physical Exam:  Filed Vitals:   12/22/14 1619  BP: 127/75  Pulse: 80  Temp: 98.6 F (37 C)  Resp: 20  Weight: 158 lb (71.668 kg)  SpO2: 97%    Physical Exam  Constitutional: No distress.  Neck: Neck supple. No JVD present. No tracheal deviation present. No thyromegaly present.  Cardiovascular: Normal rate and regular rhythm.   No murmur heard. Pulmonary/Chest: Effort normal. No respiratory distress.  Crackles noted to the right base  Abdominal: Soft. Bowel sounds are normal. He exhibits no distension. There is no tenderness.  Lymphadenopathy:    He has no cervical adenopathy.    Neurological: He is alert.  Oriented x 2, able to f/c  Skin: Skin is warm and dry. He is not diaphoretic. No erythema. There is pallor.  Psychiatric: Affect normal.    Labs reviewed/Significant Diagnostic Results:  Basic Metabolic Panel:  Recent Labs  06/03/14 07/17/14 10/21/14  NA 142 140 140  K 4.3 4.1 4.5  BUN 15 18 21   CREATININE 1.2 1.2 1.4*   Liver Function Tests:  Recent Labs  06/03/14  AST 19  ALT 21  ALKPHOS 67   No results for input(s): LIPASE, AMYLASE in the last 8760 hours. No results for input(s): AMMONIA in the last 8760 hours. CBC:  Recent Labs  06/03/14  WBC 6.7  HGB 12.1*  HCT 37*  PLT 222   CBG: No results for input(s): GLUCAP in the last 8760 hours. TSH:  Recent Labs  04/22/14 10/21/14  TSH 1.73 1.18   A1C: Lab Results  Component Value Date   HGBA1C 7.3* 06/24/2014   Lipid Panel: No results for input(s): CHOL, HDL, LDLCALC, TRIG, CHOLHDL, LDLDIRECT in the last 8760 hours.     Assessment/Plan   1. Type 2 diabetes mellitus with diabetic chronic kidney disease -improved with some numbers in the 80's in the morning -will decrease Glucotrol to 2.5 mg daily  2. Loss of weight No improvement. Continue to monitor. Consider Remeron if weight loss continues Discussed this with his daughter, Ms. Raul Del.  She agreed that we will not perform any extensive workup for this issue given his age.  3. Anemia -Check CBC, currently on ASA -Heme stool  4.  Cough -noted during our visit today with crackles to the right base -given his weight loss and over all general decline will check PCXR  5. Mixed Dementia (Alz and Vascular) -progressive decline in memory but maintains fxnl status by going to activities -continue current regimen   Cindi Carbon, [place] [phone]

## 2015-01-30 ENCOUNTER — Non-Acute Institutional Stay (SKILLED_NURSING_FACILITY): Payer: Medicare Other | Admitting: Adult Health

## 2015-01-30 DIAGNOSIS — N189 Chronic kidney disease, unspecified: Secondary | ICD-10-CM | POA: Diagnosis not present

## 2015-01-30 DIAGNOSIS — R634 Abnormal weight loss: Secondary | ICD-10-CM

## 2015-01-30 DIAGNOSIS — E1122 Type 2 diabetes mellitus with diabetic chronic kidney disease: Secondary | ICD-10-CM | POA: Diagnosis not present

## 2015-01-30 DIAGNOSIS — N182 Chronic kidney disease, stage 2 (mild): Secondary | ICD-10-CM

## 2015-01-30 DIAGNOSIS — D638 Anemia in other chronic diseases classified elsewhere: Secondary | ICD-10-CM | POA: Diagnosis not present

## 2015-01-30 DIAGNOSIS — I10 Essential (primary) hypertension: Secondary | ICD-10-CM | POA: Diagnosis not present

## 2015-02-05 ENCOUNTER — Encounter: Payer: Self-pay | Admitting: Adult Health

## 2015-02-05 DIAGNOSIS — D638 Anemia in other chronic diseases classified elsewhere: Secondary | ICD-10-CM | POA: Insufficient documentation

## 2015-02-05 NOTE — Progress Notes (Signed)
Patient ID: George Kemp, male   DOB: Oct 24, 1926, 81 y.o.   MRN: [id number]     Patient Care Team: Gayland Curry, DO as PCP - General (Geriatric Medicine) [facility] Location:  [place]   Code Status: DNR    N Place of Service: SNF (31)  Chief Complaint  Patient presents with  . Medical Management of Chronic Issues    HPI: 81 y.o. male residing at [facility], skilled care section. I am here to review his chronic medical issues. He has a hx of CVA with left sided hemiparesis, vascular dementia, DM2, Bipolar, CKD, BPH, HTN, and anemia. He has become more forgetful over time and has been losing weight, however, he is up 3 lbs from last month. He continues to participate in activities and remain pleasant with some confusion.   He has become more anemic over time with normal iron studies and negative hemoccults. He remains on aspirin at this point.  His blood sugars range 89-139 in the am and 106-217 in the pm.    Functional status: non ambulatory, uses WC, incontinent  Review of Systems:  Review of Systems  Constitutional: Negative for fever, activity change, appetite change and fatigue.  HENT: Negative for congestion and rhinorrhea.   Respiratory: Negative for cough and shortness of breath.   Cardiovascular: Positive for leg swelling. Negative for chest pain and palpitations.  Gastrointestinal: Negative for abdominal pain, constipation and abdominal distention.  Endocrine: Negative for polydipsia, polyphagia and polyuria.  Genitourinary: Negative for dysuria and difficulty urinating.  Musculoskeletal: Negative for arthralgias.  Skin: Negative for rash.  Psychiatric/Behavioral: Positive for confusion. Negative for behavioral problems and agitation.    Medications: Patient's Medications  New Prescriptions   No medications on file  Previous Medications   ACETAMINOPHEN (TYLENOL) 325 MG TABLET     Take 650 mg by mouth every 6 (six) hours as needed for pain.   ANTISEPTIC ORAL RINSE (BIOTENE) LIQD    15 mLs by Mouth Rinse route 4 (four) times daily.   ASPIRIN 81 MG TABLET    Take 81 mg by mouth daily.   CHOLECALCIFEROL (VITAMIN D) 1000 UNITS TABLET    Take 1,000 Units by mouth every morning.   CLOMIPRAMINE (ANAFRANIL) 25 MG CAPSULE    Take 25 mg by mouth every morning.   DONEPEZIL (ARICEPT) 10 MG TABLET    Take 10 mg by mouth daily.   GLIPIZIDE (GLUCOTROL) 5 MG TABLET    Take 2.5 mg by mouth daily before breakfast.   IMIQUIMOD (ALDARA) 5 % CREAM    Apply topically 2 (two) times a week.   LACTOSE FREE NUTRITION (BOOST PLUS) LIQD    Take 237 mLs by mouth. Supplement at bedtime   LATANOPROST (XALATAN) 0.005 % OPHTHALMIC SOLUTION    Place 1 drop into both eyes at bedtime.   LORATADINE (CLARITIN) 10 MG TABLET    Take 10 mg by mouth daily as needed. Take one tablet by mouth daily prn  As needed for allergies   METOPROLOL TARTRATE (LOPRESSOR) 25 MG TABLET    Take 25 mg by mouth 2 (two) times daily.    MULTIPLE VITAMIN (MULTIVITAMIN WITH MINERALS) TABS    Take 1 tablet by mouth daily.   POLYETHYLENE GLYCOL (MIRALAX / GLYCOLAX) PACKET    Take 17 g by mouth daily.   SENNOSIDES-DOCUSATE SODIUM (SENOKOT-S) 8.6-50 MG TABLET    Take 1 tablet by mouth daily.   TAMSULOSIN (FLOMAX) 0.4  MG CAPS    Take 0.4 mg by mouth daily after breakfast.  Modified Medications   No medications on file  Discontinued Medications   No medications on file     Physical Exam:  Filed Vitals:   02/05/15 1207  BP: 102/62  Pulse: 79  Temp: 98.2 F (36.8 C)  Resp: 18  Weight: 161 lb (73.029 kg)  SpO2: 93%   Wt Readings from Last 3 Encounters:  02/05/15 161 lb (73.029 kg)  12/22/14 158 lb (71.668 kg)  10/20/14 166 lb 3.2 oz (75.388 kg)    Physical Exam  Constitutional: No distress.  Neck: Neck supple. No JVD present. No tracheal deviation present. No thyromegaly present.  Cardiovascular: Normal rate and regular  rhythm.   No murmur heard. Edema noted to the left arm and leg +1  Pulmonary/Chest: Effort normal and breath sounds normal. No respiratory distress.  Abdominal: Soft. Bowel sounds are normal. He exhibits no distension. There is no tenderness.  Lymphadenopathy:    He has no cervical adenopathy.  Neurological: He is alert.  Oriented x 2, able to f/c  Skin: Skin is warm and dry. He is not diaphoretic. No erythema.  Psychiatric: Affect normal.    Labs reviewed/Significant Diagnostic Results:  Basic Metabolic Panel:  Recent Labs  07/17/14 10/21/14 12/16/14  NA 140 140 143  K 4.1 4.5 4.2  BUN 18 21 28*  CREATININE 1.2 1.4* 1.5*   Liver Function Tests:  Recent Labs  06/03/14  AST 19  ALT 21  ALKPHOS 67   No results for input(s): LIPASE, AMYLASE in the last 8760 hours. No results for input(s): AMMONIA in the last 8760 hours. CBC:  Recent Labs  06/03/14 12/16/14  WBC 6.7 7.2  HGB 12.1* 10.0*  HCT 37* 30*  PLT 222 180   CBG: No results for input(s): GLUCAP in the last 8760 hours. TSH:  Recent Labs  04/22/14 10/21/14  TSH 1.73 1.18   A1C: Lab Results  Component Value Date   HGBA1C 6.7* 12/16/2014   Lipid Panel: No results for input(s): CHOL, HDL, LDLCALC, TRIG, CHOLHDL, LDLDIRECT in the last 8760 hours.     Assessment/Plan  1. Type 2 diabetes mellitus with diabetic chronic kidney disease -due to numbers in the 80's will d/c glucotrol 2.5mg   -change cbg checks to qam M, W, F -A1C goal is <8%, he is well below this  2. Loss of weight -weight stabilization from last month -continue supplement and weight monitoring, no signs of depression  3. Anemia of chronic disease -declining over time, may be due to CKD which is worsening over time -continue to monitor CBC, check B12  4. Essential hypertension -BP controlled on lopressor -also had tachycardia at one point which has resolved  6. CKD (chronic kidney disease), stage II -worsening over time, continue  to monitor BMP periodically -no further screening test due to age/debility      Cindi Carbon, [place] [phone]

## 2015-02-10 ENCOUNTER — Non-Acute Institutional Stay (SKILLED_NURSING_FACILITY): Payer: Medicare Other | Admitting: Internal Medicine

## 2015-02-10 DIAGNOSIS — B029 Zoster without complications: Secondary | ICD-10-CM | POA: Diagnosis not present

## 2015-02-10 NOTE — Progress Notes (Signed)
Patient ID: George Kemp, male   DOB: 1926/12/22, 81 y.o.   MRN: [id number]  Location:  [facility] Provider:  Jaslynne Dahan L. Mariea Clonts, D.O., C.M.D.  Code Status:  DNR Goals of care: Advanced Directive information Does patient have an advance directive?: Yes, Type of Advance Directive: [place];Living will;Out of facility DNR (pink MOST or yellow form), Pre-existing out of facility DNR order (yellow form or pink MOST form): Yellow form placed in chart (order not valid for inpatient use), Does patient want to make changes to advanced directive?: No - Patient declined  Chief Complaint  Patient presents with  . 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  He also has DMII, bipolar disorder, CKDII and mixed AD and vascular dementia.   He was seen after staff noted he had several vesicle on his right posterior shoulder region in a cluster that also were present on his right upper chest (upper thoracic dermatome).  He denies any pain in these areas, but admits to a little bit of itching.  Staff have covered the lesions with telfa dressings due to drainage from the vesicles and he's been in isolation.     Review of Systems:  Review of Systems  Constitutional: Negative for fever and chills.  Eyes:       Glasses  Respiratory: Negative for shortness of breath.   Cardiovascular: Negative for chest pain.  Gastrointestinal: Negative for abdominal pain.  Genitourinary: Negative for dysuria.  Musculoskeletal: Negative for back pain and falls.  Skin: Positive for itching and rash.  Neurological: Positive for sensory change and focal weakness. Negative for headaches.       Hemiparesis  Endo/Heme/Allergies:       Diabetes  Psychiatric/Behavioral: Positive for depression and memory loss. The patient is nervous/anxious and has insomnia.     Past Medical History  Diagnosis Date  . Stroke  [facility]) 08/2009    Left side with residual deficit  . Hypertension   . Bipolar 1 disorder ([place])   . Dementia   . Diabetes ([place])   . Pressure ulcer of sacrum 12/07/2012  . Anemia, iron deficiency 12/30/2012  . CKD (chronic kidney disease), stage II 07/09/2012    07/16/12:   stage II chronic kidney disease by GFR.  Follow lab at interval    . Mixed Alzheimer's and vascular dementia 12/01/2012    06/07/12: STML re: CVA- contribute to debility/dependence. Pt. is aware of deficit. Will benefit from [facility] environment for assistance, supervision, socialization    07/16/12:  Admission MDS reviewed: BIMS score 13/15,  functional status: Pt requires limited to extensive assist with all ADLs except eating (supervision)    . GI bleed   . Basal cell carcinoma   . Squamous cell carcinoma of skin of scalp 01/24/14    anterior and posterior scalp     Patient Active Problem List   Diagnosis Date Noted  . Anemia of chronic disease 02/05/2015  . Loss of weight 12/22/2014  . BPH (benign prostatic hyperplasia) 06/30/2014  . Tachycardia 04/17/2014  . Squamous cell carcinoma 02/25/2014  . Rhinitis due to pollen 09/09/2013  . Cataract cortical, senile 07/28/2013  . Anxiety state, unspecified 12/30/2012  . Urinary incontinence due to immobility 12/10/2012  . Fungal dermatitis 12/07/2012  . Bipolar disorder 12/06/2012  . Late effects of cerebrovascular disease 12/06/2012  . Basal cell carcinoma of left  ear 12/06/2012  . Constipation 12/06/2012  . Closed left hip fracture 12/01/2012  . HTN (hypertension) 12/01/2012  . Diabetes mellitus with renal manifestation 12/01/2012  . Mixed Alzheimer's and vascular dementia 12/01/2012  . CKD (chronic kidney disease), stage II 07/09/2012    No Known Allergies  Medications: Patient's Medications  New Prescriptions   No medications on file  Previous Medications   ACETAMINOPHEN (TYLENOL) 325 MG TABLET    Take 650 mg by mouth every 6 (six) hours as needed for pain.    ANTISEPTIC ORAL RINSE (BIOTENE) LIQD    15 mLs by Mouth Rinse route 4 (four) times daily.   ASPIRIN 81 MG TABLET    Take 81 mg by mouth daily.   CHOLECALCIFEROL (VITAMIN D) 1000 UNITS TABLET    Take 1,000 Units by mouth every morning.   CLOMIPRAMINE (ANAFRANIL) 25 MG CAPSULE    Take 25 mg by mouth every morning.   DONEPEZIL (ARICEPT) 10 MG TABLET    Take 10 mg by mouth daily.   GLIPIZIDE (GLUCOTROL) 5 MG TABLET    Take 2.5 mg by mouth daily before breakfast.   IMIQUIMOD (ALDARA) 5 % CREAM    Apply topically 2 (two) times a week.   LACTOSE FREE NUTRITION (BOOST PLUS) LIQD    Take 237 mLs by mouth. Supplement at bedtime   LATANOPROST (XALATAN) 0.005 % OPHTHALMIC SOLUTION    Place 1 drop into both eyes at bedtime.   LORATADINE (CLARITIN) 10 MG TABLET    Take 10 mg by mouth daily as needed. Take one tablet by mouth daily prn  As needed for allergies   METOPROLOL TARTRATE (LOPRESSOR) 25 MG TABLET    Take 25 mg by mouth 2 (two) times daily.    MULTIPLE VITAMIN (MULTIVITAMIN WITH MINERALS) TABS    Take 1 tablet by mouth daily.   POLYETHYLENE GLYCOL (MIRALAX / GLYCOLAX) PACKET    Take 17 g by mouth daily.   SENNOSIDES-DOCUSATE SODIUM (SENOKOT-S) 8.6-50 MG TABLET    Take 1 tablet by mouth daily.   TAMSULOSIN (FLOMAX) 0.4 MG CAPS    Take 0.4 mg by mouth daily after breakfast.  Modified Medications   No medications on file  Discontinued Medications   No medications on file    Physical Exam: Filed Vitals:   02/10/15 1258  BP: 98/61  Pulse: 76  Temp: 97.2 F (36.2 C)  Resp: 18  SpO2: 99%   There is no weight on file to calculate BMI.  Physical Exam  Constitutional: He appears well-developed and well-nourished. No distress.  Cardiovascular: Normal rate, regular rhythm, normal heart sounds and intact distal pulses.   Pulmonary/Chest: Effort normal and breath sounds normal.  Abdominal: Soft. Bowel sounds are normal.  Skin:  Vesicular rash of right upper thoracic region of chest and  posterior shoulder--some are draining, some are new, none yet crusted over--has one cluster anteriorly, one cluster posteriorly    Labs reviewed: Basic Metabolic Panel:  Recent Labs  07/17/14 10/21/14 12/16/14  NA 140 140 143  K 4.1 4.5 4.2  BUN 18 21 28*  CREATININE 1.2 1.4* 1.5*    Liver Function Tests:  Recent Labs  06/03/14  AST 19  ALT 21  ALKPHOS 67    CBC:  Recent Labs  06/03/14 12/16/14  WBC 6.7 7.2  HGB 12.1* 10.0*  HCT 37* 30*  PLT 222 180    Lab Results  Component Value Date   TSH 1.18 10/21/2014   Lab Results  Component Value  Date   HGBA1C 6.7* 12/16/2014   Lab Results  Component Value Date   CHOL 152 06/27/2013   HDL 55 06/27/2013   LDLCALC 78 06/27/2013   TRIG 83 06/27/2013   Patient Care Team: Gayland Curry, DO as PCP - General (Geriatric Medicine) [facility]  Assessment/Plan 1. Herpes zoster infection of thoracic region -right chest and back -will treat with continued contact isolation, cover draining vesicles -treat with valacylovir 1000mg  po tid for 7 days -monitor for pain in the area (only has some minor itching at present) -no pregnant staff to be in contact with him while he has open lesions or anyone who's never had chickenpox -also mentioned to staff that the antivirals can increase confusion/cause some delirium in patients taking them especially if they have baseline dementia like he does  Family/ staff Communication: discussed with SNF nurse and nurse manager  Labs/tests ordered:  No new  Tasnim Balentine L. Chasiti Waddington, D.O. [facility] [address], [place] [address] Cell Phone (Mon-Fri 8am-5pm):  [phone] On Call:  [phone] & follow prompts after 5pm & weekends Office Phone:  [phone] Office Fax:  [phone]

## 2015-02-28 ENCOUNTER — Encounter: Payer: Self-pay | Admitting: Internal Medicine

## 2015-03-13 ENCOUNTER — Non-Acute Institutional Stay (SKILLED_NURSING_FACILITY): Payer: Medicare Other | Admitting: Adult Health

## 2015-03-13 DIAGNOSIS — F3178 Bipolar disorder, in full remission, most recent episode mixed: Secondary | ICD-10-CM

## 2015-03-13 DIAGNOSIS — G309 Alzheimer's disease, unspecified: Secondary | ICD-10-CM

## 2015-03-13 DIAGNOSIS — I10 Essential (primary) hypertension: Secondary | ICD-10-CM | POA: Diagnosis not present

## 2015-03-13 DIAGNOSIS — N63 Unspecified lump in unspecified breast: Secondary | ICD-10-CM

## 2015-03-13 DIAGNOSIS — F0280 Dementia in other diseases classified elsewhere without behavioral disturbance: Secondary | ICD-10-CM | POA: Diagnosis not present

## 2015-03-13 DIAGNOSIS — F0150 Vascular dementia without behavioral disturbance: Secondary | ICD-10-CM

## 2015-03-13 DIAGNOSIS — E1122 Type 2 diabetes mellitus with diabetic chronic kidney disease: Secondary | ICD-10-CM

## 2015-03-13 DIAGNOSIS — N182 Chronic kidney disease, stage 2 (mild): Secondary | ICD-10-CM

## 2015-03-16 ENCOUNTER — Encounter: Payer: Self-pay | Admitting: Adult Health

## 2015-03-16 NOTE — Progress Notes (Signed)
Patient ID: George Kemp, male   DOB: [birth year], 81 y.o.   MRN: [id number]     Patient Care Team: Gayland Curry, DO as PCP - General (Geriatric Medicine) [facility] Location:  [place]   Code Status: DNR    N Place of Service: SNF (31)  Chief Complaint  Patient presents with  . Acute Visit    breast lump  . Medical Management of Chronic Issues    HPI: 81 y.o. male residing at [facility], skilled care section. I am here to review his chronic medical issues. He has a hx of CVA with left sided hemiparesis, vascular dementia, DM2, Bipolar, CKD, BPH, HTN, and anemia. He reports a painful lump to his left chest area under in nipple. He is poor historian and can not provide further hx.  He has shown signs of decline over the past few months with decreasing weight and appetite, with increasing forgetfulness. His goals of care are comfort based. His blood sugars have ranged 125-150. He was recently taken off glucotrol and has tolerated this well.     Functional status: non ambulatory, uses WC, incontinent  Review of Systems:  Review of Systems  Constitutional: Negative for fever, activity change, appetite change and fatigue.  HENT: Negative for congestion and rhinorrhea.   Respiratory: Negative for cough and shortness of breath.   Cardiovascular: Positive for leg swelling. Negative for chest pain and palpitations.  Gastrointestinal: Negative for abdominal pain, constipation and abdominal distention.  Endocrine: Negative for polydipsia, polyphagia and polyuria.  Genitourinary: Negative for dysuria and difficulty urinating.  Musculoskeletal: Negative for arthralgias.  Skin: Negative for rash.       Left chest lump  Psychiatric/Behavioral: Positive for confusion. Negative for behavioral problems and agitation.    Medications: Patient's Medications  New Prescriptions   No medications on file  Previous  Medications   ACETAMINOPHEN (TYLENOL) 325 MG TABLET    Take 650 mg by mouth every 6 (six) hours as needed for pain.   ANTISEPTIC ORAL RINSE (BIOTENE) LIQD    15 mLs by Mouth Rinse route 4 (four) times daily.   ASPIRIN 81 MG TABLET    Take 81 mg by mouth daily.   CHOLECALCIFEROL (VITAMIN D) 1000 UNITS TABLET    Take 1,000 Units by mouth every morning.   CLOMIPRAMINE (ANAFRANIL) 25 MG CAPSULE    Take 25 mg by mouth every morning.   DONEPEZIL (ARICEPT) 10 MG TABLET    Take 10 mg by mouth daily.   IMIQUIMOD (ALDARA) 5 % CREAM    Apply topically 2 (two) times a week.   LACTOSE FREE NUTRITION (BOOST PLUS) LIQD    Take 237 mLs by mouth. Supplement at bedtime   LATANOPROST (XALATAN) 0.005 % OPHTHALMIC SOLUTION    Place 1 drop into both eyes at bedtime.   LORATADINE (CLARITIN) 10 MG TABLET    Take 10 mg by mouth daily as needed. Take one tablet by mouth daily prn  As needed for allergies   METOPROLOL TARTRATE (LOPRESSOR) 25 MG TABLET    Take 25 mg by mouth 2 (two) times daily.    MULTIPLE VITAMIN (MULTIVITAMIN WITH MINERALS) TABS    Take 1 tablet by mouth daily.   POLYETHYLENE GLYCOL (MIRALAX / GLYCOLAX) PACKET    Take 17 g by mouth daily.   SENNOSIDES-DOCUSATE SODIUM (SENOKOT-S) 8.6-50 MG TABLET    Take 1 tablet by mouth daily.   TAMSULOSIN (FLOMAX) 0.4 MG CAPS  Take 0.4 mg by mouth daily after breakfast.  Modified Medications   No medications on file  Discontinued Medications   GLIPIZIDE (GLUCOTROL) 5 MG TABLET    Take 2.5 mg by mouth daily before breakfast.     Physical Exam:  Filed Vitals:   03/16/15 1204  Weight: 160 lb (72.576 kg)   Wt Readings from Last 3 Encounters:  03/16/15 160 lb (72.576 kg)  02/05/15 161 lb (73.029 kg)  12/22/14 158 lb (71.668 kg)    Physical Exam  Constitutional: No distress.  Neck: Neck supple. No JVD present. No tracheal deviation present. No thyromegaly present.  Cardiovascular: Normal rate and regular rhythm.   No murmur heard. Edema noted to the  left arm and leg +1  Pulmonary/Chest: Effort normal and breath sounds normal. No respiratory distress.    Abdominal: Soft. Bowel sounds are normal. He exhibits no distension. There is no tenderness.  Lymphadenopathy:    He has no cervical adenopathy.  Neurological: He is alert.  Oriented x 2, able to f/c. Repetitive head nodding noted during visit.  Skin: Skin is warm and dry. He is not diaphoretic. No erythema.  Psychiatric: Affect normal.    Labs reviewed/Significant Diagnostic Results:  Basic Metabolic Panel:  Recent Labs  07/17/14 10/21/14 12/16/14  NA 140 140 143  K 4.1 4.5 4.2  BUN 18 21 28*  CREATININE 1.2 1.4* 1.5*   Liver Function Tests:  Recent Labs  06/03/14  AST 19  ALT 21  ALKPHOS 67   No results for input(s): LIPASE, AMYLASE in the last 8760 hours. No results for input(s): AMMONIA in the last 8760 hours. CBC:  Recent Labs  06/03/14 12/16/14  WBC 6.7 7.2  HGB 12.1* 10.0*  HCT 37* 30*  PLT 222 180   CBG: No results for input(s): GLUCAP in the last 8760 hours. TSH:  Recent Labs  04/22/14 10/21/14  TSH 1.73 1.18   A1C: Lab Results  Component Value Date   HGBA1C 6.7* 12/16/2014   Lipid Panel: No results for input(s): CHOL, HDL, LDLCALC, TRIG, CHOLHDL, LDLDIRECT in the last 8760 hours.     Assessment/Plan   1. Breast lump -I discussed with Malva Limes my findings. I recommended no further work up due to his debilitated state. He will discuss this with his wife (pts daughter), Lelon Frohlich, and report back to me. We could certainly perform an ultrasound to evaluate the area but with no plan for intervention, this may not be necessary.  2. Essential hypertension -controlled  3. Type 2 diabetes mellitus with stage 2 chronic kidney disease, without long-term current use of insulin (HCC) -stable continue to monitor -change cbg checks to qam M, W, F -A1C goal is <8%, he is well below this  4. Mixed Alzheimer's and vascular dementia -cognitive  decline, continue current meds at this time since he is still able to participate in activities  5. Bipolar disorder, in full remission, most recent episode mixed ([place]) -controlled -consider dose reduction of anafranil due to head nodding (tic) noted on exam -Family will get back with me on this issue as well.     Cindi Carbon, ANP [facility] [phone]

## 2015-04-20 ENCOUNTER — Non-Acute Institutional Stay (SKILLED_NURSING_FACILITY): Payer: Medicare Other | Admitting: Adult Health

## 2015-04-20 ENCOUNTER — Encounter: Payer: Self-pay | Admitting: Adult Health

## 2015-04-20 DIAGNOSIS — I10 Essential (primary) hypertension: Secondary | ICD-10-CM | POA: Diagnosis not present

## 2015-04-20 DIAGNOSIS — G459 Transient cerebral ischemic attack, unspecified: Secondary | ICD-10-CM

## 2015-04-20 DIAGNOSIS — I6990 Unspecified sequelae of unspecified cerebrovascular disease: Secondary | ICD-10-CM

## 2015-04-20 DIAGNOSIS — E1122 Type 2 diabetes mellitus with diabetic chronic kidney disease: Secondary | ICD-10-CM | POA: Diagnosis not present

## 2015-04-20 DIAGNOSIS — N182 Chronic kidney disease, stage 2 (mild): Secondary | ICD-10-CM

## 2015-04-20 LAB — CBC AND DIFFERENTIAL
HEMATOCRIT: 34 % — AB (ref 41–53)
HEMOGLOBIN: 11.1 g/dL — AB (ref 13.5–17.5)
Platelets: 170 10*3/uL (ref 150–399)
WBC: 6.8 10^3/mL

## 2015-04-20 LAB — BASIC METABOLIC PANEL
BUN: 28 mg/dL — AB (ref 4–21)
Creatinine: 1.5 mg/dL — AB (ref 0.6–1.3)
Glucose: 204 mg/dL
POTASSIUM: 4.5 mmol/L (ref 3.4–5.3)
SODIUM: 141 mmol/L (ref 137–147)

## 2015-04-20 NOTE — Progress Notes (Signed)
Patient ID: George Kemp, male   DOB: November 28, 1926, 81 y.o.   MRN: [id number]     Nursing Home Location:  [place]   Code Status: DNR  Patient Care Team: Gayland Curry, DO as PCP - General (Geriatric Medicine) [facility] [place] of Service: SNF [phone])  Chief Complaint  Patient presents with  . Acute Visit    difficulty with speech    HPI:  81 y.o.  Male residing at [facility], skilled care section. I was asked to see him today for difficulty with speech and lethargy, resent for 5 minutes and then spontaneously resolving. He has a hx of CVA with residual left hemiplegia. He also has a hx of DM Type 2 and his blood sugar at the time was 211.  He had no fever and his other VS were within normal limits. For my visit his speech is clear. He denies any vision changes and speech problems now. He reports that he is thinking clearly but a little "dizzy".  He is able to follow commands and articulates himself well.       Review of Systems:  Review of Systems  Constitutional: Negative for fever, chills, diaphoresis, activity change, appetite change and fatigue.  HENT: Negative for congestion.   Respiratory: Negative for cough, shortness of breath and wheezing.   Cardiovascular: Negative for chest pain, palpitations and leg swelling.  Gastrointestinal: Negative for nausea, vomiting, constipation and abdominal distention.  Genitourinary: Negative for dysuria.  Musculoskeletal: Positive for arthralgias and gait problem. Negative for back pain.  Skin: Negative for color change, pallor and rash.  Neurological: Positive for dizziness. Negative for tremors, seizures, syncope, facial asymmetry, speech difficulty, light-headedness, numbness and headaches.       Left hemiplegia  Psychiatric/Behavioral: Positive for confusion. Negative for behavioral problems and agitation.    Medications: Patient's Medications  New Prescriptions   No medications on file  Previous Medications   ACETAMINOPHEN (TYLENOL) 325 MG TABLET    Take 650 mg by mouth every 6 (six) hours as needed for pain.   ANTISEPTIC ORAL RINSE (BIOTENE) LIQD    15 mLs by Mouth Rinse route 4 (four) times daily.   ASPIRIN 81 MG TABLET    Take 81 mg by mouth daily.   CHOLECALCIFEROL (VITAMIN D) 1000 UNITS TABLET    Take 1,000 Units by mouth every morning.   CLOMIPRAMINE (ANAFRANIL) 25 MG CAPSULE    Take 25 mg by mouth every morning.   DONEPEZIL (ARICEPT) 10 MG TABLET    Take 10 mg by mouth daily.   IMIQUIMOD (ALDARA) 5 % CREAM    Apply topically 2 (two) times a week.   LACTOSE FREE NUTRITION (BOOST PLUS) LIQD    Take 237 mLs by mouth. Supplement at bedtime   LATANOPROST (XALATAN) 0.005 % OPHTHALMIC SOLUTION    Place 1 drop into both eyes at bedtime.   LORATADINE (CLARITIN) 10 MG TABLET    Take 10 mg by mouth daily as needed. Take one tablet by mouth daily prn  As needed for allergies   METOPROLOL TARTRATE (LOPRESSOR) 25 MG TABLET    Take 25 mg by mouth 2 (two) times daily.    MULTIPLE VITAMIN (MULTIVITAMIN WITH MINERALS) TABS    Take 1 tablet by mouth daily.   POLYETHYLENE GLYCOL (MIRALAX / GLYCOLAX) PACKET    Take 17 g by mouth daily.   SENNOSIDES-DOCUSATE SODIUM (SENOKOT-S) 8.6-50 MG TABLET    Take 1 tablet by mouth  daily.   TAMSULOSIN (FLOMAX) 0.4 MG CAPS    Take 0.4 mg by mouth daily after breakfast.  Modified Medications   No medications on file  Discontinued Medications   No medications on file     Physical Exam:  Filed Vitals:   04/20/15 1240  BP: 118/69  Pulse: 68  Resp: 18  SpO2: 100%    Physical Exam  Constitutional: No distress.  HENT:  Head: Normocephalic and atraumatic.  Eyes: Conjunctivae and EOM are normal. Pupils are equal, round, and reactive to light. Right eye exhibits no discharge. Left eye exhibits no discharge.  Neck: Normal range of motion. Neck supple. No JVD present.  Cardiovascular: Normal rate and regular rhythm.   No  murmur heard. Pulmonary/Chest: Effort normal and breath sounds normal. No respiratory distress.  Abdominal: Soft. Bowel sounds are normal. He exhibits no distension. There is no tenderness.  Neurological: He is alert. No cranial nerve deficit.  Oriented to self, situation, and place but not time. Able to follow commands and answer q's.  Skin: Skin is warm and dry. He is not diaphoretic.  Psychiatric: Affect normal.    Wt Readings from Last 3 Encounters:  03/16/15 160 lb (72.576 kg)  02/05/15 161 lb (73.029 kg)  12/22/14 158 lb (71.668 kg)     Labs reviewed/Significant Diagnostic Results:  Basic Metabolic Panel:  Recent Labs  07/17/14 10/21/14 12/16/14  NA 140 140 143  K 4.1 4.5 4.2  BUN 18 21 28*  CREATININE 1.2 1.4* 1.5*   Liver Function Tests:  Recent Labs  06/03/14  AST 19  ALT 21  ALKPHOS 67   No results for input(s): LIPASE, AMYLASE in the last 8760 hours. No results for input(s): AMMONIA in the last 8760 hours. CBC:  Recent Labs  06/03/14 12/16/14  WBC 6.7 7.2  HGB 12.1* 10.0*  HCT 37* 30*  PLT 222 180   CBG: No results for input(s): GLUCAP in the last 8760 hours. TSH:  Recent Labs  04/22/14 10/21/14  TSH 1.73 1.18   A1C: Lab Results  Component Value Date   HGBA1C 6.7* 12/16/2014   Lipid Panel: No results for input(s): CHOL, HDL, LDLCALC, TRIG, CHOLHDL, LDLDIRECT in the last 8760 hours.     Assessment/Plan  1. Transient cerebral ischemia, unspecified transient cerebral ischemia type -most likely the cause of these symptoms which spontaneous resolved -he is already on 81 mg of asa daily, would not increase due to hx of GIB, and anemia -goals of care are comfort based  2. Essential hypertension -controlled, continue lopressor  3. Type 2 diabetes mellitus with stage 2 chronic kidney disease, without long-term current use of insulin (HCC) -goal A1C <8% due to age and debility -current CBGs running 150-170 -not currently on meds as he was  not eating well and has been losing weight with subsequent borderline cbgs on glucotrol  4. CKD (chronic kidney disease), stage II -stable, monitor BUN/Cr  5. Late effects of cerebrovascular disease -continues to work with Physiological scientist but remains WC bound with significant deficit -see above  His symptoms have spontaneously resolved. The staff will continue to monitor his VS and report any changes. Generally speaking Mr. Bajaj has declined over time with less oral intake and weight loss. He is more forgetful but still able to participate in activities and communicate his needs. I have had conversations with Mr. Raul Del (his son in law) and they wish to avoid aggressive care but continue to address medical concerns that are easily reversible  here at [facility]  Labs/tests ordered CBC CMP   Cindi Carbon, ANP [facility] [phone]

## 2015-04-30 ENCOUNTER — Non-Acute Institutional Stay (SKILLED_NURSING_FACILITY): Payer: Medicare Other | Admitting: Adult Health

## 2015-04-30 DIAGNOSIS — G459 Transient cerebral ischemic attack, unspecified: Secondary | ICD-10-CM

## 2015-05-04 ENCOUNTER — Encounter: Payer: Self-pay | Admitting: Adult Health

## 2015-05-04 DIAGNOSIS — G459 Transient cerebral ischemic attack, unspecified: Secondary | ICD-10-CM | POA: Insufficient documentation

## 2015-05-04 NOTE — Progress Notes (Signed)
Patient ID: George Kemp, male   DOB: 07-30-1926, 81 y.o.   MRN: [id number]     Nursing Home Location:  [place]   Code Status: DNR  Patient Care Team: George Curry, DO as PCP - General (Geriatric Medicine) [facility] [place] of Service: SNF [phone])  Chief Complaint  Patient presents with  . Acute Visit    slurred speech    HPI:  81 y.o.  Male residing at [facility], skilled care section. I was asked to see him today for difficulty with speech and lethargy, present for 20 minutes and then resolving with fatigue afterwards. These same symptoms were found on 11/21 but only lasted 5 minutes. His blood sugar today was 242, and his VS were WNL. He has no other associated symptoms.  He has a hx of CVA with residual left hemiplegia.  He was on ASA and plavix for stroke prevention in 2014 but had acute blood loss anemia after a hip surgery and so they were stopped. Later aspirin was added back but not plavix. There is a remote hx of GIB but we do not have any further info on this issue. He has not had any acute bleeding episodes. His Hgb has remained stable at 11.1 on 11/21.  Due to his overall poor health and debility his family has not wanted aggressive care.      Review of Systems:  Review of Systems  Constitutional: Positive for fatigue. Negative for fever, chills, diaphoresis, activity change and appetite change.  HENT: Negative for congestion.   Respiratory: Negative for cough, shortness of breath and wheezing.   Cardiovascular: Negative for chest pain, palpitations and leg swelling.  Gastrointestinal: Negative for nausea, vomiting, constipation and abdominal distention.  Genitourinary: Negative for dysuria.  Musculoskeletal: Positive for arthralgias and gait problem. Negative for back pain.  Skin: Negative for color change, pallor and rash.  Neurological: Positive for facial asymmetry and speech difficulty.  Negative for dizziness, tremors, seizures, syncope, light-headedness, numbness and headaches.       Left hemiplegia  Psychiatric/Behavioral: Positive for confusion. Negative for behavioral problems and agitation.    Medications: Patient's Medications  New Prescriptions   No medications on file  Previous Medications   ACETAMINOPHEN (TYLENOL) 325 MG TABLET    Take 650 mg by mouth every 6 (six) hours as needed for pain.   ANTISEPTIC ORAL RINSE (BIOTENE) LIQD    15 mLs by Mouth Rinse route 4 (four) times daily.   ASPIRIN 81 MG TABLET    Take 81 mg by mouth daily.   CHOLECALCIFEROL (VITAMIN D) 1000 UNITS TABLET    Take 1,000 Units by mouth every morning.   CLOMIPRAMINE (ANAFRANIL) 25 MG CAPSULE    Take 25 mg by mouth every morning.   CLOPIDOGREL (PLAVIX) 75 MG TABLET    Take 75 mg by mouth daily.   DONEPEZIL (ARICEPT) 10 MG TABLET    Take 10 mg by mouth daily.   IMIQUIMOD (ALDARA) 5 % CREAM    Apply topically 2 (two) times a week.   LACTOSE FREE NUTRITION (BOOST PLUS) LIQD    Take 237 mLs by mouth. Supplement at bedtime   LATANOPROST (XALATAN) 0.005 % OPHTHALMIC SOLUTION    Place 1 drop into both eyes at bedtime.   LORATADINE (CLARITIN) 10 MG TABLET    Take 10 mg by mouth daily as needed. Take one tablet by mouth daily prn  As needed for allergies   METOPROLOL TARTRATE (  LOPRESSOR) 25 MG TABLET    Take 25 mg by mouth 2 (two) times daily.    MULTIPLE VITAMIN (MULTIVITAMIN WITH MINERALS) TABS    Take 1 tablet by mouth daily.   POLYETHYLENE GLYCOL (MIRALAX / GLYCOLAX) PACKET    Take 17 g by mouth daily.   SENNOSIDES-DOCUSATE SODIUM (SENOKOT-S) 8.6-50 MG TABLET    Take 1 tablet by mouth daily.   TAMSULOSIN (FLOMAX) 0.4 MG CAPS    Take 0.4 mg by mouth daily after breakfast.  Modified Medications   No medications on file  Discontinued Medications   No medications on file     Physical Exam:  Filed Vitals:   05/04/15 1148  BP: 131/74  Pulse: 74  Temp: 97.5 F (36.4 C)  Resp: 14  SpO2:  100%    Physical Exam  Constitutional: No distress.  HENT:  Head: Normocephalic and atraumatic.  Eyes: Conjunctivae and EOM are normal. Pupils are equal, round, and reactive to light. Right eye exhibits no discharge. Left eye exhibits no discharge.  Neck: Normal range of motion. Neck supple. No JVD present.  Cardiovascular: Normal rate and regular rhythm.   No murmur heard. Pulmonary/Chest: Effort normal and breath sounds normal. No respiratory distress.  Abdominal: Soft. Bowel sounds are normal. He exhibits no distension. There is no tenderness.  Neurological: He is alert. No cranial nerve deficit.  Oriented to self, situation, and place but not time. Able to follow commands and answer q's.  Skin: Skin is warm and dry. He is not diaphoretic.  Psychiatric: Affect normal.    Wt Readings from Last 3 Encounters:  03/16/15 160 lb (72.576 kg)  02/05/15 161 lb (73.029 kg)  12/22/14 158 lb (71.668 kg)     Labs reviewed/Significant Diagnostic Results:  Basic Metabolic Panel:  Recent Labs  10/21/14 12/16/14 04/20/15  NA 140 143 141  K 4.5 4.2 4.5  BUN 21 28* 28*  CREATININE 1.4* 1.5* 1.5*   Liver Function Tests:  Recent Labs  06/03/14  AST 19  ALT 21  ALKPHOS 67   No results for input(s): LIPASE, AMYLASE in the last 8760 hours. No results for input(s): AMMONIA in the last 8760 hours. CBC:  Recent Labs  06/03/14 12/16/14 04/20/15  WBC 6.7 7.2 6.8  HGB 12.1* 10.0* 11.1*  HCT 37* 30* 34*  PLT 222 180 170   CBG: No results for input(s): GLUCAP in the last 8760 hours. TSH:  Recent Labs  10/21/14  TSH 1.18   A1C: Lab Results  Component Value Date   HGBA1C 6.7* 12/16/2014   Lipid Panel: No results for input(s): CHOL, HDL, LDLCALC, TRIG, CHOLHDL, LDLDIRECT in the last 8760 hours.     Assessment/Plan  1. Transient cerebral ischemia, unspecified transient cerebral ischemia type -After discussion with Dr. Mariea Clonts and the resident's family, we have decided to  begin Plavix in conjunction with aspirin to prevent further events -he will need CBC monthly -His family does not wish to send him to the hospital, they are only interested in interventions that will improve his quality of life. These two most recent events have been distressing to him and his wife. Mr. Capito currently is able to participate in activities at the facility despite an overall weight loss and cognitive decline. Our goal here is only to attempt to prevent these events, but if he develops anemia while on plavix we will need to d/c it. His family is in agreement with the above plan.    Cindi Carbon, ANP [facility] [phone]

## 2015-05-20 LAB — CBC AND DIFFERENTIAL
HEMATOCRIT: 31 % — AB (ref 41–53)
HEMOGLOBIN: 10.4 g/dL — AB (ref 13.5–17.5)
PLATELETS: 159 10*3/uL (ref 150–399)
WBC: 7.1 10*3/mL

## 2015-05-29 ENCOUNTER — Encounter: Payer: Self-pay | Admitting: Adult Health

## 2015-05-29 ENCOUNTER — Non-Acute Institutional Stay (SKILLED_NURSING_FACILITY): Payer: Medicare Other | Admitting: Adult Health

## 2015-05-29 DIAGNOSIS — K5900 Constipation, unspecified: Secondary | ICD-10-CM

## 2015-05-29 DIAGNOSIS — I10 Essential (primary) hypertension: Secondary | ICD-10-CM | POA: Diagnosis not present

## 2015-05-29 DIAGNOSIS — E1122 Type 2 diabetes mellitus with diabetic chronic kidney disease: Secondary | ICD-10-CM | POA: Diagnosis not present

## 2015-05-29 DIAGNOSIS — N400 Enlarged prostate without lower urinary tract symptoms: Secondary | ICD-10-CM | POA: Diagnosis not present

## 2015-05-29 DIAGNOSIS — N182 Chronic kidney disease, stage 2 (mild): Secondary | ICD-10-CM | POA: Diagnosis not present

## 2015-05-29 DIAGNOSIS — G459 Transient cerebral ischemic attack, unspecified: Secondary | ICD-10-CM

## 2015-05-29 NOTE — Progress Notes (Addendum)
Patient ID: George Kemp, male   DOB: 1926/06/27, 81 y.o.   MRN: [id number]     Nursing Home Location:  [place]   Code Status: DNR  Patient Care Team: Gayland Curry, DO as PCP - General (Geriatric Medicine) [facility] [place] of Service: SNF [phone])  Chief Complaint  Patient presents with  . Medical Management of Chronic Issues    HPI:  81 y.o.  Male residing at [facility], skilled care section. He has a hx of CVA with left hemiparesis. Has had three episodes of slurred speech and lethargy that resolve within 5-20 minutes, possibly due to TIA.  Currently on aspirin, plavix was added on 12/5 but discontinued on 12/6 because the Hgb dropped from 11.1 to 10.4 over one month. No overt signs of bleeding, negative hemoccults. BP has been wnl. CBGs have ranged 126-171.  Weight has remained stable over the past month at 161 lbs, but has lost weight over all in the past year. Reports regular BMs, currently on miralax. Denies difficulty urinating, incontinent most of the time, currently on flomax. Due to his overall poor health and debility his family has not wanted aggressive care.      Review of Systems:  Review of Systems  Constitutional: Negative for fever, chills, diaphoresis, activity change, appetite change and fatigue.  HENT: Negative for congestion.   Respiratory: Negative for cough, shortness of breath and wheezing.   Cardiovascular: Negative for chest pain, palpitations and leg swelling.  Gastrointestinal: Negative for nausea, vomiting, constipation and abdominal distention.  Genitourinary: Negative for dysuria and difficulty urinating.  Musculoskeletal: Positive for arthralgias and gait problem. Negative for back pain.  Skin: Negative for color change, pallor and rash.  Neurological: Positive for facial asymmetry and speech difficulty. Negative for dizziness, tremors, seizures, syncope, light-headedness,  numbness and headaches.       Left hemiplegia  Psychiatric/Behavioral: Positive for confusion. Negative for behavioral problems and agitation.    Medications: Patient's Medications  New Prescriptions   No medications on file  Previous Medications   ACETAMINOPHEN (TYLENOL) 325 MG TABLET    Take 650 mg by mouth every 6 (six) hours as needed for pain.   ANTISEPTIC ORAL RINSE (BIOTENE) LIQD    15 mLs by Mouth Rinse route 4 (four) times daily.   ASPIRIN 81 MG TABLET    Take 81 mg by mouth daily.   CHOLECALCIFEROL (VITAMIN D) 1000 UNITS TABLET    Take 1,000 Units by mouth every morning.   CLOMIPRAMINE (ANAFRANIL) 25 MG CAPSULE    Take 25 mg by mouth every morning.   DONEPEZIL (ARICEPT) 10 MG TABLET    Take 10 mg by mouth daily.   IMIQUIMOD (ALDARA) 5 % CREAM    Apply topically 2 (two) times a week.   LACTOSE FREE NUTRITION (BOOST PLUS) LIQD    Take 237 mLs by mouth. Supplement at bedtime   LATANOPROST (XALATAN) 0.005 % OPHTHALMIC SOLUTION    Place 1 drop into both eyes at bedtime.   LORATADINE (CLARITIN) 10 MG TABLET    Take 10 mg by mouth daily as needed. Take one tablet by mouth daily prn  As needed for allergies   METOPROLOL TARTRATE (LOPRESSOR) 25 MG TABLET    Take 25 mg by mouth 2 (two) times daily.    MULTIPLE VITAMIN (MULTIVITAMIN WITH MINERALS) TABS    Take 1 tablet by mouth daily.   POLYETHYLENE GLYCOL (MIRALAX / GLYCOLAX) PACKET    Take  17 g by mouth daily.   SENNOSIDES-DOCUSATE SODIUM (SENOKOT-S) 8.6-50 MG TABLET    Take 1 tablet by mouth daily.   TAMSULOSIN (FLOMAX) 0.4 MG CAPS    Take 0.4 mg by mouth daily after breakfast.  Modified Medications   No medications on file  Discontinued Medications   CLOPIDOGREL (PLAVIX) 75 MG TABLET    Take 75 mg by mouth daily.     Physical Exam:  Filed Vitals:   05/29/15 1144  BP: 148/78  Pulse: 80  Temp: 97.3 F (36.3 C)  Resp: 16  Weight: 161 lb (73.029 kg)  SpO2: 95%    Physical Exam  Constitutional: No distress.  HENT:  Head:  Normocephalic and atraumatic.  Eyes: Conjunctivae and EOM are normal. Pupils are equal, round, and reactive to light. Right eye exhibits no discharge. Left eye exhibits no discharge.  Neck: Normal range of motion. Neck supple. No JVD present.  Cardiovascular: Normal rate and regular rhythm.   No murmur heard. Pulmonary/Chest: Effort normal and breath sounds normal. No respiratory distress.  Abdominal: Soft. Bowel sounds are normal. He exhibits no distension. There is no tenderness.  Neurological: He is alert. No cranial nerve deficit.  Oriented to self, situation, and place but not time. Able to follow commands and answer q's.  Skin: Skin is warm and dry. He is not diaphoretic.  Psychiatric: Affect normal.    Wt Readings from Last 3 Encounters:  05/29/15 161 lb (73.029 kg)  03/16/15 160 lb (72.576 kg)  02/05/15 161 lb (73.029 kg)     Labs reviewed/Significant Diagnostic Results:  Basic Metabolic Panel:  Recent Labs  10/21/14 12/16/14 04/20/15  NA 140 143 141  K 4.5 4.2 4.5  BUN 21 28* 28*  CREATININE 1.4* 1.5* 1.5*   Liver Function Tests:  Recent Labs  06/03/14  AST 19  ALT 21  ALKPHOS 67   No results for input(s): LIPASE, AMYLASE in the last 8760 hours. No results for input(s): AMMONIA in the last 8760 hours. CBC:  Recent Labs  12/16/14 04/20/15 05/20/15  WBC 7.2 6.8 7.1  HGB 10.0* 11.1* 10.4*  HCT 30* 34* 31*  PLT 180 170 159   CBG: No results for input(s): GLUCAP in the last 8760 hours. TSH:  Recent Labs  10/21/14  TSH 1.18   A1C: Lab Results  Component Value Date   HGBA1C 6.7* 12/16/2014   Lipid Panel: No results for input(s): CHOL, HDL, LDLCALC, TRIG, CHOLHDL, LDLDIRECT in the last 8760 hours.     Assessment/Plan  1. Essential hypertension -controlled, continue lopressor -has DM2 with CKD -would not implement an ace due to goals of care  2. Transient cerebral ischemia, unspecified transient cerebral ischemia type -three episodes noted  in the past month -not in afib per EKG on 12/1, regular today -continue aspirin, no further intervention per family/goals of care  3. Constipation, unspecified constipation type -controlled with miralax  4. Type 2 diabetes mellitus with stage 2 chronic kidney disease, without long-term current use of insulin (HCC) -controlled, without meds -non compliant at times with diet -goal A1C <8%  5. BPH (benign prostatic hyperplasia) -no symptoms, continue flomax    Cindi Carbon, [place] [phone]

## 2015-06-02 LAB — CBC AND DIFFERENTIAL
HCT: 32 % — AB (ref 41–53)
Hemoglobin: 10.8 g/dL — AB (ref 13.5–17.5)
PLATELETS: 165 10*3/uL (ref 150–399)
WBC: 7.3 10*3/mL

## 2015-07-02 ENCOUNTER — Encounter: Payer: Self-pay | Admitting: Adult Health

## 2015-07-02 ENCOUNTER — Non-Acute Institutional Stay (SKILLED_NURSING_FACILITY): Payer: Medicare Other | Admitting: Adult Health

## 2015-07-02 DIAGNOSIS — H00011 Hordeolum externum right upper eyelid: Secondary | ICD-10-CM

## 2015-07-02 DIAGNOSIS — I10 Essential (primary) hypertension: Secondary | ICD-10-CM | POA: Diagnosis not present

## 2015-07-02 DIAGNOSIS — F0280 Dementia in other diseases classified elsewhere without behavioral disturbance: Secondary | ICD-10-CM | POA: Diagnosis not present

## 2015-07-02 DIAGNOSIS — F0150 Vascular dementia without behavioral disturbance: Secondary | ICD-10-CM

## 2015-07-02 DIAGNOSIS — N182 Chronic kidney disease, stage 2 (mild): Secondary | ICD-10-CM | POA: Diagnosis not present

## 2015-07-02 DIAGNOSIS — D638 Anemia in other chronic diseases classified elsewhere: Secondary | ICD-10-CM

## 2015-07-02 DIAGNOSIS — G309 Alzheimer's disease, unspecified: Secondary | ICD-10-CM | POA: Diagnosis not present

## 2015-07-02 DIAGNOSIS — E1122 Type 2 diabetes mellitus with diabetic chronic kidney disease: Secondary | ICD-10-CM | POA: Diagnosis not present

## 2015-07-02 NOTE — Progress Notes (Signed)
Patient ID: George Kemp, male   DOB: 1926/08/06, 82 y.o.   MRN: [id number]     Nursing Home Location:  [place]   Code Status: DNR  Patient Care Team: Gayland Curry, DO as PCP - General (Geriatric Medicine) [facility] [place] of Service: SNF [phone])  Chief Complaint  Patient presents with  . Medical Management of Chronic Issues    HPI:  82 y.o.  Male residing at [facility], skilled care section. I am here to review his chronic medical issues. He has a hx of CVA with left sided hemiplegia, VD, and DM2.    His weight has remains stable at  160 lbs. VS wnl.  DM 2: CBGS 113-201.  Not currently on meds.  VD: currently on aricept. MMSE 20/30 on 10/20/14 with failed clock  HTN: BP controlled on toprol 25 mg BID  Normocytic normochromic anemia: Hgb 10.4 06/02/15, stable.  Not on plavix due to drop in H/H.  Normal iron and ferritin levels in July of 2016    Review of Systems:  Review of Systems  Constitutional: Negative for fever, chills, diaphoresis, activity change, appetite change and fatigue.  HENT: Negative for congestion.   Eyes: Positive for redness. Negative for photophobia, pain, discharge, itching and visual disturbance.  Respiratory: Negative for cough, shortness of breath and wheezing.   Cardiovascular: Negative for chest pain, palpitations and leg swelling.  Gastrointestinal: Negative for nausea, vomiting, constipation and abdominal distention.  Genitourinary: Negative for dysuria.  Musculoskeletal: Positive for arthralgias and gait problem. Negative for back pain.  Skin: Negative for color change, pallor and rash.  Neurological: Positive for facial asymmetry and speech difficulty. Negative for dizziness, tremors, seizures, syncope, light-headedness, numbness and headaches.       Left hemiplegia  Psychiatric/Behavioral: Positive for confusion. Negative for behavioral problems and agitation.     Medications: Patient's Medications  New Prescriptions   No medications on file  Previous Medications   ACETAMINOPHEN (TYLENOL) 325 MG TABLET    Take 650 mg by mouth every 6 (six) hours as needed for pain.   ANTISEPTIC ORAL RINSE (BIOTENE) LIQD    15 mLs by Mouth Rinse route 4 (four) times daily.   ASPIRIN 81 MG TABLET    Take 81 mg by mouth daily.   CHOLECALCIFEROL (VITAMIN D) 1000 UNITS TABLET    Take 1,000 Units by mouth every morning.   CLOMIPRAMINE (ANAFRANIL) 25 MG CAPSULE    Take 25 mg by mouth every morning.   DONEPEZIL (ARICEPT) 10 MG TABLET    Take 10 mg by mouth daily.   LATANOPROST (XALATAN) 0.005 % OPHTHALMIC SOLUTION    Place 1 drop into both eyes at bedtime.   METOPROLOL TARTRATE (LOPRESSOR) 25 MG TABLET    Take 25 mg by mouth 2 (two) times daily.    MULTIPLE VITAMIN (MULTIVITAMIN WITH MINERALS) TABS    Take 1 tablet by mouth daily.   POLYETHYLENE GLYCOL (MIRALAX / GLYCOLAX) PACKET    Take 17 g by mouth daily.   SENNOSIDES-DOCUSATE SODIUM (SENOKOT-S) 8.6-50 MG TABLET    Take 1 tablet by mouth daily.   TAMSULOSIN (FLOMAX) 0.4 MG CAPS    Take 0.4 mg by mouth daily after breakfast.  Modified Medications   No medications on file  Discontinued Medications   IMIQUIMOD (ALDARA) 5 % CREAM    Apply topically 2 (two) times a week.   LACTOSE FREE NUTRITION (BOOST PLUS) LIQD    Take 237 mLs  by mouth. Supplement at bedtime   LORATADINE (CLARITIN) 10 MG TABLET    Take 10 mg by mouth daily as needed. Take one tablet by mouth daily prn  As needed for allergies     Physical Exam:  Filed Vitals:   07/02/15 1041  BP: 114/66  Pulse: 64  Temp: 97.1 F (36.2 C)  Resp: 18  Weight: 160 lb (72.576 kg)    Physical Exam  Constitutional: No distress.  HENT:  Head: Normocephalic and atraumatic.  Eyes: Conjunctivae and EOM are normal. Pupils are equal, round, and reactive to light. Right eye exhibits hordeolum. Right eye exhibits no discharge. Left eye exhibits no discharge.  Neck:  Normal range of motion. Neck supple. No JVD present.  Cardiovascular: Normal rate and regular rhythm.   No murmur heard. Pulmonary/Chest: Effort normal and breath sounds normal. No respiratory distress.  Abdominal: Soft. Bowel sounds are normal. He exhibits no distension. There is no tenderness.  Neurological: He is alert. No cranial nerve deficit.  Oriented to self, situation, and place but not time. Able to follow commands and answer q's.  Skin: Skin is warm and dry. He is not diaphoretic.  Psychiatric: Affect normal.    Wt Readings from Last 3 Encounters:  07/02/15 160 lb (72.576 kg)  05/29/15 161 lb (73.029 kg)  03/16/15 160 lb (72.576 kg)     Labs reviewed/Significant Diagnostic Results:  Basic Metabolic Panel:  Recent Labs  10/21/14 12/16/14 04/20/15  NA 140 143 141  K 4.5 4.2 4.5  BUN 21 28* 28*  CREATININE 1.4* 1.5* 1.5*   Liver Function Tests: No results for input(s): AST, ALT, ALKPHOS, BILITOT, PROT, ALBUMIN in the last 8760 hours. No results for input(s): LIPASE, AMYLASE in the last 8760 hours. No results for input(s): AMMONIA in the last 8760 hours. CBC:  Recent Labs  04/20/15 05/20/15 06/02/15  WBC 6.8 7.1 7.3  HGB 11.1* 10.4* 10.8*  HCT 34* 31* 32*  PLT 170 159 165   CBG: No results for input(s): GLUCAP in the last 8760 hours. TSH:  Recent Labs  10/21/14  TSH 1.18   A1C: Lab Results  Component Value Date   HGBA1C 6.7* 12/16/2014   Lipid Panel: No results for input(s): CHOL, HDL, LDLCALC, TRIG, CHOLHDL, LDLDIRECT in the last 8760 hours.     Assessment/Plan  1. 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 Mixed Alzheimer's and vascular dementia -stable continue aricept -some periods of increased anxiety/forgetfulness noted, consider adding namenda if continues  3. Essential hypertension -controlled  4. Anemia of chronic disease -normocytic,  normochromic anemia -continue current meds  5. Hordeolum externum of right upper eyelid -warm compresses 15 min TID for 48 hrs   Cindi Carbon, ANP [facility] [phone]

## 2015-07-07 LAB — CBC AND DIFFERENTIAL
HEMATOCRIT: 31 % — AB (ref 41–53)
Hemoglobin: 10.6 g/dL — AB (ref 13.5–17.5)
Platelets: 170 10*3/uL (ref 150–399)
WBC: 7.5 10^3/mL

## 2015-08-06 ENCOUNTER — Encounter: Payer: Self-pay | Admitting: Adult Health

## 2015-08-06 ENCOUNTER — Non-Acute Institutional Stay (SKILLED_NURSING_FACILITY): Payer: Medicare Other | Admitting: Adult Health

## 2015-08-06 DIAGNOSIS — I6990 Unspecified sequelae of unspecified cerebrovascular disease: Secondary | ICD-10-CM | POA: Diagnosis not present

## 2015-08-06 DIAGNOSIS — I10 Essential (primary) hypertension: Secondary | ICD-10-CM | POA: Diagnosis not present

## 2015-08-06 DIAGNOSIS — D638 Anemia in other chronic diseases classified elsewhere: Secondary | ICD-10-CM | POA: Diagnosis not present

## 2015-08-06 DIAGNOSIS — N400 Enlarged prostate without lower urinary tract symptoms: Secondary | ICD-10-CM

## 2015-08-06 DIAGNOSIS — E1122 Type 2 diabetes mellitus with diabetic chronic kidney disease: Secondary | ICD-10-CM

## 2015-08-06 DIAGNOSIS — N182 Chronic kidney disease, stage 2 (mild): Secondary | ICD-10-CM | POA: Diagnosis not present

## 2015-08-06 NOTE — Progress Notes (Signed)
Patient ID: George Kemp, male   DOB: 11/03/1926, 82 y.o.   MRN: [id number]     Nursing Home Location:  [place]   Code Status: DNR  Patient Care Team: Gayland Curry, DO as PCP - General (Geriatric Medicine) [facility] [place] of Service: SNF [phone])  Chief Complaint  Patient presents with  . Medical Management of Chronic Issues    HPI:  82 y.o.  Male residing at [facility], skilled care section. I am here to review his chronic medical issues. He has a hx of CVA with left sided hemiplegia, VD, and DM2.    His weight has remains stable at  160 lbs. VS wnl.  DM 2: CBGS 150-177.  Not currently on meds.  HTN: BP controlled on toprol 25 mg BID  Normocytic normochromic anemia: Hgb 10.6 07/07/15, stable.  Not on plavix due to drop in H/H  CVD: s/p CVA with left hemiplegia with advanced vascular dementia  BPH: denies urinary symptoms, on flomax   Review of Systems:  Review of Systems  Constitutional: Negative for fever, chills, diaphoresis, activity change, appetite change and fatigue.  HENT: Negative for congestion.   Respiratory: Negative for cough, shortness of breath and wheezing.   Cardiovascular: Negative for chest pain, palpitations and leg swelling.  Gastrointestinal: Negative for nausea, vomiting, constipation and abdominal distention.  Endocrine: Negative for cold intolerance, heat intolerance, polydipsia, polyphagia and polyuria.  Genitourinary: Negative for dysuria and difficulty urinating.       Incontinent  Musculoskeletal: Positive for arthralgias and gait problem. Negative for back pain.  Skin: Negative for color change, pallor and rash.  Neurological: Positive for facial asymmetry and speech difficulty. Negative for dizziness, tremors, seizures, syncope, light-headedness, numbness and headaches.       Left hemiplegia  Psychiatric/Behavioral: Positive for confusion. Negative for behavioral problems  and agitation.    Medications: Patient's Medications  New Prescriptions   No medications on file  Previous Medications   ACETAMINOPHEN (TYLENOL) 325 MG TABLET    Take 650 mg by mouth every 6 (six) hours as needed for pain.   ANTISEPTIC ORAL RINSE (BIOTENE) LIQD    15 mLs by Mouth Rinse route 4 (four) times daily.   ASPIRIN 81 MG TABLET    Take 81 mg by mouth daily.   CHOLECALCIFEROL (VITAMIN D) 1000 UNITS TABLET    Take 1,000 Units by mouth every morning.   CLOMIPRAMINE (ANAFRANIL) 25 MG CAPSULE    Take 25 mg by mouth every morning.   DONEPEZIL (ARICEPT) 10 MG TABLET    Take 10 mg by mouth daily.   LATANOPROST (XALATAN) 0.005 % OPHTHALMIC SOLUTION    Place 1 drop into both eyes at bedtime.   METOPROLOL TARTRATE (LOPRESSOR) 25 MG TABLET    Take 25 mg by mouth 2 (two) times daily.    MULTIPLE VITAMIN (MULTIVITAMIN WITH MINERALS) TABS    Take 1 tablet by mouth daily.   POLYETHYLENE GLYCOL (MIRALAX / GLYCOLAX) PACKET    Take 17 g by mouth daily.   QUETIAPINE (SEROQUEL) 25 MG TABLET    Take 25 mg by mouth at bedtime.   SENNOSIDES-DOCUSATE SODIUM (SENOKOT-S) 8.6-50 MG TABLET    Take 1 tablet by mouth daily.   TAMSULOSIN (FLOMAX) 0.4 MG CAPS    Take 0.4 mg by mouth daily after breakfast.  Modified Medications   No medications on file  Discontinued Medications   No medications on file     Physical  Exam:  Filed Vitals:   08/06/15 1532  BP: 122/72  Pulse: 84  Temp: 99 F (37.2 C)  Resp: 20  Weight: 160 lb 6.4 oz (72.757 kg)    Physical Exam  Constitutional: No distress.  HENT:  Head: Normocephalic and atraumatic.  Neck: Normal range of motion. Neck supple. No JVD present.  Cardiovascular: Normal rate and regular rhythm.   No murmur heard. Pulmonary/Chest: Effort normal and breath sounds normal. No respiratory distress.  Abdominal: Soft. Bowel sounds are normal. He exhibits no distension. There is no tenderness.  Neurological: He is alert. No cranial nerve deficit.  Oriented to  self, situation, and place but not time. Able to follow commands and answer q's.  Skin: Skin is warm and dry. He is not diaphoretic.  Psychiatric: Affect normal.    Wt Readings from Last 3 Encounters:  08/06/15 160 lb 6.4 oz (72.757 kg)  07/02/15 160 lb (72.576 kg)  05/29/15 161 lb (73.029 kg)     Labs reviewed/Significant Diagnostic Results:  Basic Metabolic Panel:  Recent Labs  10/21/14 12/16/14 04/20/15  NA 140 143 141  K 4.5 4.2 4.5  BUN 21 28* 28*  CREATININE 1.4* 1.5* 1.5*   Liver Function Tests: No results for input(s): AST, ALT, ALKPHOS, BILITOT, PROT, ALBUMIN in the last 8760 hours. No results for input(s): LIPASE, AMYLASE in the last 8760 hours. No results for input(s): AMMONIA in the last 8760 hours. CBC:  Recent Labs  05/20/15 06/02/15 07/07/15  WBC 7.1 7.3 7.5  HGB 10.4* 10.8* 10.6*  HCT 31* 32* 31*  PLT 159 165 170   CBG: No results for input(s): GLUCAP in the last 8760 hours. TSH:  Recent Labs  10/21/14  TSH 1.18   A1C: Lab Results  Component Value Date   HGBA1C 6.7* 12/16/2014   Lipid Panel: No results for input(s): CHOL, HDL, LDLCALC, TRIG, CHOLHDL, LDLDIRECT in the last 8760 hours.     Assessment/Plan  1. Essential hypertension -controlled  2. Type 2 diabetes mellitus with stage 2 chronic kidney disease, without long-term current use of insulin (HCC) -controlled, no aggressive monitoring due to age/debility -not currently on meds, goal A1C <8%, check labs next visit  3. CKD (chronic kidney disease), stage II -stable -BMP next visit  4. BPH (benign prostatic hyperplasia) -stable -continue flomax  5. Late effects of cerebrovascular disease -no new events, continue aspirin -monitor BP, no lipid monitoring due to age and debility  6. Anemia of chronic disease -stable H/H off plavix   Cindi Carbon, ANP [facility] [phone]

## 2015-08-27 ENCOUNTER — Encounter: Payer: Self-pay | Admitting: Adult Health

## 2015-08-27 ENCOUNTER — Non-Acute Institutional Stay (SKILLED_NURSING_FACILITY): Payer: Medicare Other | Admitting: Adult Health

## 2015-08-27 DIAGNOSIS — M25552 Pain in left hip: Secondary | ICD-10-CM | POA: Diagnosis not present

## 2015-08-27 NOTE — Progress Notes (Signed)
Patient ID: George Kemp, male   DOB: [birth year], 82 y.o.   MRN: [id number]  Location:  [place]:  SNF (31) Provider:   Cindi Carbon, ANP [place] [phone]   REED, Jonelle Sidle, DO  Patient Care Team: Gayland Curry, DO as PCP - General (Geriatric Medicine) [facility]  Extended Emergency Contact Information Primary Emergency Contact: [facility] Address: [address] [place], [place] [address] [name] of [place] Phone: [phone] Mobile Phone: [phone] Relation: Daughter Secondary Emergency Contact: [facility] Address: [address]          [place], [place] [address] [name] of [place] Phone: [phone] Mobile Phone: [phone] Relation: Spouse  Code Status:  DNR Goals of care: Advanced Directive information Advanced Directives 02/28/2015  Does patient have an advance directive? Yes  Type of Paramedic of [place];Living will;Out of facility DNR (pink MOST or yellow form)  Does patient want to make changes to advanced directive? No - Patient declined  Copy of advanced directive(s) in chart? Yes  Pre-existing out of facility DNR order (yellow form or pink MOST form) Yellow form placed in chart (order not valid for inpatient use)     Chief Complaint  Patient presents with  . Acute Visit    left hip pain    HPI:  Pt is a 82 y.o. male seen today for an acute visit for left hip noted while in the stand up lift. He has a hx of CVA with left sided weakness and needs to the stand up lift for transfers. He has had increase in weakness over the past year with periods of difficulty in the stand up lift but today he was unable to use it at all and reported hip pain on the left. There are no reports of a fall. He has a new bruise to his right hand but denies pain to this area. Staff are unaware of how it developed.   Past Medical  History  Diagnosis Date  . Stroke [facility]) 08/2009    Left side with residual deficit  . Hypertension   . Bipolar 1 disorder ([place])   . Dementia   . Diabetes ([place])   . Pressure ulcer of sacrum 12/07/2012  . Anemia, iron deficiency 12/30/2012  . CKD (chronic kidney disease), stage II 07/09/2012    07/16/12:   stage II chronic kidney disease by GFR.  Follow lab at interval    . Mixed Alzheimer's and vascular dementia 12/01/2012    06/07/12: STML re: CVA- contribute to debility/dependence. Pt. is aware of deficit. Will benefit from [place] environment for assistance, supervision, socialization    07/16/12:  Admission MDS reviewed: BIMS score 13/15,  functional status: Pt requires limited to extensive assist with all ADLs except eating (supervision)    . GI bleed   . Basal cell carcinoma   . Squamous cell carcinoma of skin of scalp 01/24/14    anterior and posterior scalp    Past Surgical History  Procedure Laterality Date  . Hip arthroplasty Left 12/01/2012    Procedure: ARTHROPLASTY BIPOLAR HIP;  Surgeon: Johnn Hai, MD;  Location: [facility];  Service: Orthopedics;  Laterality: Left;  . Skin lesion excision  01/24/14    Anterior and posterio scalp Squamous cell [name] PA    No Known Allergies    Medication List  This list is accurate as of: 08/27/15  2:40 PM.  Always use your most recent med list.               acetaminophen 325 MG tablet  Commonly known as:  TYLENOL  Take 650 mg by mouth every 6 (six) hours as needed for pain.     ANAFRANIL 25 MG capsule  Generic drug:  clomiPRAMINE  Take 25 mg by mouth every morning.     antiseptic oral rinse Liqd  15 mLs by Mouth Rinse route 4 (four) times daily.     aspirin 81 MG tablet  Take 81 mg by mouth daily.     cholecalciferol 1000 units tablet  Commonly known as:  VITAMIN D  Take 1,000 Units by mouth every morning.     donepezil 10 MG tablet  Commonly known as:  ARICEPT  Take 10 mg by mouth daily.      latanoprost 0.005 % ophthalmic solution  Commonly known as:  XALATAN  Place 1 drop into both eyes at bedtime.     metoprolol tartrate 25 MG tablet  Commonly known as:  LOPRESSOR  Take 25 mg by mouth 2 (two) times daily.     multivitamin with minerals Tabs tablet  Take 1 tablet by mouth daily.     polyethylene glycol packet  Commonly known as:  MIRALAX / GLYCOLAX  Take 17 g by mouth daily.     QUEtiapine 25 MG tablet  Commonly known as:  SEROQUEL  Take 25 mg by mouth at bedtime.     sennosides-docusate sodium 8.6-50 MG tablet  Commonly known as:  SENOKOT-S  Take 1 tablet by mouth daily.     tamsulosin 0.4 MG Caps capsule  Commonly known as:  FLOMAX  Take 0.4 mg by mouth daily after breakfast.        Review of Systems  Constitutional: Positive for activity change.  Respiratory: Negative for cough and shortness of breath.   Cardiovascular: Negative for chest pain and leg swelling.  Musculoskeletal: Positive for arthralgias and gait problem. Negative for back pain.  Neurological: Positive for facial asymmetry (left due to previous CVA) and speech difficulty (dysarthria). Negative for dizziness and seizures.  Psychiatric/Behavioral: Positive for confusion. Negative for behavioral problems and agitation.    Immunization History  Administered Date(s) Administered  . Influenza Whole 03/13/2013  . Influenza-Unspecified 03/04/2014, 03/12/2015  . PPD Test 07/05/2012   Pertinent  Health Maintenance Due  Topic Date Due  . FOOT EXAM  01/30/1939  . OPHTHALMOLOGY EXAM  01/30/1939  . URINE MICROALBUMIN  01/30/1939  . PNA vac Low Risk Adult (1 of 2 - PCV13) 01/29/1994  . HEMOGLOBIN A1C  06/18/2015  . INFLUENZA VACCINE  12/29/2015   Fall Risk  05/29/2015 03/16/2015 02/05/2015 10/20/2014  Falls in the past year? No No No No  Risk for fall due to : History of fall(s) - - -   Functional Status Survey:    There were no vitals filed for this visit. There is no weight on file to  calculate BMI. Physical Exam  Constitutional: No distress.  Musculoskeletal: He exhibits no edema or tenderness.  Left hip without deformity, mild pain with ROM, no erythema or edema o tenderness  Skin: He is not diaphoretic.  Bruise noted to left dorsum of hand  Psychiatric: He has a normal mood and affect.    Labs reviewed:  Recent Labs  10/21/14 12/16/14 04/20/15  NA 140 143 141  K 4.5 4.2 4.5  BUN 21 28* 28*  CREATININE 1.4* 1.5* 1.5*   No results for input(s): AST, ALT, ALKPHOS, BILITOT, PROT, ALBUMIN in the last 8760 hours.  Recent Labs  05/20/15 06/02/15 07/07/15  WBC 7.1 7.3 7.5  HGB 10.4* 10.8* 10.6*  HCT 31* 32* 31*  PLT 159 165 170   Lab Results  Component Value Date   TSH 1.18 10/21/2014   Lab Results  Component Value Date   HGBA1C 6.7* 12/16/2014   Lab Results  Component Value Date   CHOL 152 06/27/2013   HDL 55 06/27/2013   LDLCALC 78 06/27/2013   TRIG 83 06/27/2013    Significant Diagnostic Results in last 30 days:  No results found.  Assessment/Plan 1. Left hip pain -no obvious signs of fracture or dislocation on exam -noted left hand bruise, wondering if there was an unknown fall or near fall leading to this pain -obtain xray of left hip/pelvis to rule out fracture    Family/ staff Communication: discussed with nurse  Labs/tests ordered:  Hip xray left  Cindi Carbon, [place] [phone]

## 2015-09-08 NOTE — Progress Notes (Signed)
This encounter was created in error - please disregard.

## 2015-09-18 ENCOUNTER — Non-Acute Institutional Stay (SKILLED_NURSING_FACILITY): Payer: Medicare Other | Admitting: Adult Health

## 2015-09-18 DIAGNOSIS — F0280 Dementia in other diseases classified elsewhere without behavioral disturbance: Secondary | ICD-10-CM

## 2015-09-18 DIAGNOSIS — F0150 Vascular dementia without behavioral disturbance: Secondary | ICD-10-CM

## 2015-09-18 DIAGNOSIS — D638 Anemia in other chronic diseases classified elsewhere: Secondary | ICD-10-CM | POA: Diagnosis not present

## 2015-09-18 DIAGNOSIS — E1122 Type 2 diabetes mellitus with diabetic chronic kidney disease: Secondary | ICD-10-CM | POA: Diagnosis not present

## 2015-09-18 DIAGNOSIS — G309 Alzheimer's disease, unspecified: Secondary | ICD-10-CM

## 2015-09-18 DIAGNOSIS — F05 Delirium due to known physiological condition: Secondary | ICD-10-CM

## 2015-09-18 DIAGNOSIS — N182 Chronic kidney disease, stage 2 (mild): Secondary | ICD-10-CM

## 2015-09-18 DIAGNOSIS — E559 Vitamin D deficiency, unspecified: Secondary | ICD-10-CM

## 2015-09-18 DIAGNOSIS — I6990 Unspecified sequelae of unspecified cerebrovascular disease: Secondary | ICD-10-CM | POA: Diagnosis not present

## 2015-09-18 LAB — CBC AND DIFFERENTIAL
HCT: 34 % — AB (ref 41–53)
Hemoglobin: 11.6 g/dL — AB (ref 13.5–17.5)
Platelets: 217 10*3/uL (ref 150–399)
WBC: 8.7 10^3/mL

## 2015-09-29 ENCOUNTER — Encounter: Payer: Self-pay | Admitting: Adult Health

## 2015-09-29 DIAGNOSIS — E559 Vitamin D deficiency, unspecified: Secondary | ICD-10-CM | POA: Insufficient documentation

## 2015-09-29 NOTE — Progress Notes (Signed)
Patient ID: George Kemp, male   DOB: 03-04-1927, 82 y.o.   MRN: [id number]  Location:   [facility]   Place of Service:   SNF Provider:   Cindi Carbon, ANP [facility] [phone]   Hollace Kinnier, DO  Patient Care Team: Gayland Curry, DO as PCP - General (Geriatric Medicine) [facility]  Extended Emergency Contact Information Primary Emergency Contact: [facility] Address: [address] [place], [place] [address] [name] Phone: [phone] Mobile Phone: [phone] Relation: Daughter Secondary Emergency Contact: [facility] Address: [address]          [name], [place] [address] [name] Phone: [phone] Mobile Phone: [phone] Relation: Spouse  Code Status:  DNR Goals of care: Advanced Directive information Advanced Directives 02/28/2015  Does patient have an advance directive? Yes  Type of Paramedic of [place];Living will;Out of facility DNR (pink MOST or yellow form)  Does patient want to make changes to advanced directive? No - Patient declined  Copy of advanced directive(s) in chart? Yes  Pre-existing out of facility DNR order (yellow form or pink MOST form) Yellow form placed in chart (order not valid for inpatient use)     Chief Complaint  Patient presents with  . Acute Visit    more confused  . Medical Management of Chronic Issues    HPI:  Pt is a 82 y.o. male seen today for acute complaints of increased confusion and medical management of chronic diseases.    1. Acute confusional state The staff has reported, as well as other residents, an increase in lethargy during bingo and while eating in the dining room. He is afebrile, but the staff has noted a cough and runny nose which have been present for 3 days.   2. Mixed Alzheimer's and vascular dementia Has shown signs of progressive cognitive decline. Non ambulatory due to CVA but  able to feed himself but requires assistance for all other ADL's.  Continues to participate in activities.  Currently on aricept.  3. Late effects of cerebrovascular disease S/p CVA with left sided weakness in 2011. Has had 2-3 episodes of one sided weakness since in SNF but did not tolerate plavix, with decrease in H/H  4. Anemia of chronic disease - CBC Latest Ref Rng 07/07/2015 06/02/2015 05/20/2015  WBC - 7.5 7.3 7.1  Hemoglobin 13.5 - 17.5 g/dL 10.6(A) 10.8(A) 10.4(A)  Hematocrit 41 - 53 % 31(A) 32(A) 31(A)  Platelets 150 - 399 K/L 170 165 159  -stable    5. Type 2 diabetes mellitus with stage 2 chronic kidney disease, without long-term current use of insulin (HCC) -off meds -CBGS range 150-186   Past Medical History  Diagnosis Date  . Stroke [facility]) 08/2009    Left side with residual deficit  . Hypertension   . Bipolar 1 disorder ([place])   . Dementia   . Diabetes ([place])   . Pressure ulcer of sacrum 12/07/2012  . Anemia, iron deficiency 12/30/2012  . CKD (chronic kidney disease), stage II 07/09/2012    07/16/12:   stage II chronic kidney disease by GFR.  Follow lab at interval    . Mixed Alzheimer's and vascular dementia 12/01/2012    06/07/12: STML re: CVA- contribute to debility/dependence. Pt. is aware of deficit. Will benefit from [place] environment for assistance, supervision, socialization    07/16/12:  Admission MDS reviewed: BIMS score 13/15,  functional status: Pt requires limited to extensive assist with all ADLs except eating (supervision)    . GI bleed   . Basal cell carcinoma   . Squamous cell carcinoma of skin of scalp 01/24/14    anterior and posterior scalp    Past Surgical History  Procedure Laterality Date  . Hip arthroplasty Left 12/01/2012    Procedure: ARTHROPLASTY BIPOLAR HIP;  Surgeon: Johnn Hai, MD;  Location: [facility];  Service: Orthopedics;  Laterality: Left;  . Skin lesion excision  01/24/14    Anterior and posterio scalp Squamous cell [name] PA     No Known Allergies    Medication List       This list is accurate as of: 09/18/15 11:59 PM.  Always use your most recent med list.               acetaminophen 325 MG tablet  Commonly known as:  TYLENOL  Take 650 mg by mouth every 6 (six) hours as needed for pain.     ANAFRANIL 25 MG capsule  Generic drug:  clomiPRAMINE  Take 25 mg by mouth every morning.     antiseptic oral rinse Liqd  15 mLs by Mouth Rinse route 4 (four) times daily.     aspirin 81 MG tablet  Take 81 mg by mouth daily.     cholecalciferol 1000 units tablet  Commonly known as:  VITAMIN D  Take 1,000 Units by mouth every morning.     donepezil 10 MG tablet  Commonly known as:  ARICEPT  Take 10 mg by mouth daily.     latanoprost 0.005 % ophthalmic solution  Commonly known as:  XALATAN  Place 1 drop into both eyes at bedtime.     metoprolol tartrate 25 MG tablet  Commonly known as:  LOPRESSOR  Take 25 mg by mouth 2 (two) times daily.     multivitamin with minerals Tabs tablet  Take 1 tablet by mouth daily.     polyethylene glycol packet  Commonly known as:  MIRALAX / GLYCOLAX  Take 17 g by mouth daily.     QUEtiapine 25 MG tablet  Commonly known as:  SEROQUEL  Take 25 mg by mouth at bedtime.     sennosides-docusate sodium 8.6-50 MG tablet  Commonly known as:  SENOKOT-S  Take 1 tablet by mouth daily.     tamsulosin 0.4 MG Caps capsule  Commonly known as:  FLOMAX  Take 0.4 mg by mouth daily after breakfast.        Review of Systems  Constitutional: Positive for activity change. Negative for fever, chills, diaphoresis, appetite change, fatigue and unexpected weight change.  HENT: Positive for congestion, postnasal drip and rhinorrhea. Negative for sore throat, trouble swallowing and voice change.   Eyes: Negative for visual disturbance.  Respiratory: Positive for cough. Negative for shortness of breath, wheezing and stridor.   Cardiovascular: Negative for chest pain, palpitations  and leg swelling.  Gastrointestinal: Negative for diarrhea, constipation and abdominal distention.  Genitourinary: Negative for dysuria and difficulty urinating.  Musculoskeletal: Positive for gait problem. Negative for back pain and arthralgias.  Skin: Positive for pallor. Negative for rash and wound.  Neurological: Negative for dizziness, tremors, seizures, syncope and light-headedness.       Left hemiparesis  Psychiatric/Behavioral: Positive for confusion. Negative for suicidal ideas, hallucinations, behavioral problems, sleep disturbance and agitation. The patient is not nervous/anxious.     Immunization History  Administered Date(s) Administered  . Influenza Whole 03/13/2013  .  Influenza-Unspecified 03/04/2014, 03/12/2015  . PPD Test 07/05/2012   Pertinent  Health Maintenance Due  Topic Date Due  . FOOT EXAM  01/30/1939  . OPHTHALMOLOGY EXAM  01/30/1939  . URINE MICROALBUMIN  01/30/1939  . PNA vac Low Risk Adult (1 of 2 - PCV13) 01/29/1994  . HEMOGLOBIN A1C  06/18/2015  . INFLUENZA VACCINE  12/29/2015   Fall Risk  05/29/2015 03/16/2015 02/05/2015 10/20/2014  Falls in the past year? No No No No  Risk for fall due to : History of fall(s) - - -   Functional Status Survey:    Filed Vitals:   09/29/15 0757  BP: 113/71  Pulse: 74  Temp: 97.1 F (36.2 C)  Resp: 18  Weight: 168 lb (76.204 kg)   Body mass index is 23.44 kg/(m^2). Physical Exam  Constitutional: No distress.  HENT:  Head: Normocephalic and atraumatic.  Right Ear: External ear normal.  Left Ear: External ear normal.  Mouth/Throat: Oropharynx is clear and moist. No oropharyngeal exudate.  Clear drainage to both nares, both TMs WNL  Eyes: Conjunctivae and EOM are normal. Pupils are equal, round, and reactive to light. Right eye exhibits no discharge. Left eye exhibits no discharge.  Neck: Normal range of motion. Neck supple.  Cardiovascular: Normal rate and regular rhythm.   No murmur heard. Pulmonary/Chest:  Effort normal and breath sounds normal. No respiratory distress. He has no wheezes.  Abdominal: Soft. Bowel sounds are normal. He exhibits no distension.  Lymphadenopathy:    He has no cervical adenopathy.  Neurological: He is alert.  Oriented to self, situation, and place, not time.  Able to communicate and follow commands, forgetful of the details of his care.  Left hemiparesis  Skin: Skin is warm and dry. He is not diaphoretic.  Psychiatric: He has a normal mood and affect.    Labs reviewed:  Recent Labs  10/21/14 12/16/14 04/20/15  NA 140 143 141  K 4.5 4.2 4.5  BUN 21 28* 28*  CREATININE 1.4* 1.5* 1.5*   No results for input(s): AST, ALT, ALKPHOS, BILITOT, PROT, ALBUMIN in the last 8760 hours.  Recent Labs  05/20/15 06/02/15 07/07/15  WBC 7.1 7.3 7.5  HGB 10.4* 10.8* 10.6*  HCT 31* 32* 31*  PLT 159 165 170   Lab Results  Component Value Date   TSH 1.18 10/21/2014   Lab Results  Component Value Date   HGBA1C 6.7* 12/16/2014   Lab Results  Component Value Date   CHOL 152 06/27/2013   HDL 55 06/27/2013   LDLCALC 78 06/27/2013   TRIG 83 06/27/2013    Significant Diagnostic Results in last 30 days:  No results found.  Assessment/Plan 1. Acute confusional state -does not appear to be acute to me, rather a progressive cognitive decline -no signs of a new neuro event although recurrent TIAs and previous CVA without the addition of plavix could certainly contribute to his progressive cognitive decline -check CXR, CBC, B12, and Vit D  2. Mixed Alzheimer's and vascular dementia -progressive -continue aricept, could consider namenda but would wait until #1 is sorted out  3. Late effects of cerebrovascular disease -left hemiplegia from previous CVA, multiple TIA's -continue aspirin -unfortunately he could not tolerate Plavix (decrease H/H)  4. Anemia of chronic disease -check CBC, B12  5. Type 2 diabetes mellitus with stage 2 chronic kidney disease, without  long-term current use of insulin (HCC) -stable -continue current diet  6. Vit D def -check Vit D level  Family/ staff Communication:  discussed with staff  Cindi Carbon, ANP [facility] [phone]

## 2015-10-06 ENCOUNTER — Non-Acute Institutional Stay (SKILLED_NURSING_FACILITY): Payer: Medicare Other | Admitting: Internal Medicine

## 2015-10-06 ENCOUNTER — Encounter: Payer: Self-pay | Admitting: Internal Medicine

## 2015-10-06 DIAGNOSIS — F3178 Bipolar disorder, in full remission, most recent episode mixed: Secondary | ICD-10-CM | POA: Diagnosis not present

## 2015-10-06 DIAGNOSIS — E1122 Type 2 diabetes mellitus with diabetic chronic kidney disease: Secondary | ICD-10-CM | POA: Diagnosis not present

## 2015-10-06 DIAGNOSIS — F0280 Dementia in other diseases classified elsewhere without behavioral disturbance: Secondary | ICD-10-CM | POA: Diagnosis not present

## 2015-10-06 DIAGNOSIS — I10 Essential (primary) hypertension: Secondary | ICD-10-CM

## 2015-10-06 DIAGNOSIS — K5900 Constipation, unspecified: Secondary | ICD-10-CM | POA: Diagnosis not present

## 2015-10-06 DIAGNOSIS — F0150 Vascular dementia without behavioral disturbance: Secondary | ICD-10-CM | POA: Diagnosis not present

## 2015-10-06 DIAGNOSIS — G309 Alzheimer's disease, unspecified: Secondary | ICD-10-CM | POA: Diagnosis not present

## 2015-10-06 DIAGNOSIS — N182 Chronic kidney disease, stage 2 (mild): Secondary | ICD-10-CM | POA: Diagnosis not present

## 2015-10-06 DIAGNOSIS — N400 Enlarged prostate without lower urinary tract symptoms: Secondary | ICD-10-CM | POA: Diagnosis not present

## 2015-10-06 NOTE — Progress Notes (Signed)
Patient ID: George Kemp, male   DOB: Sep 06, 1926, 82 y.o.   MRN: [id number]   Location:  [facility] Room Number: [id number] Place of Service:  SNF (31) Provider:  Anae Hams L. Mariea Clonts, D.O., C.M.D.  Hollace Kinnier, DO  Patient Care Team: Gayland Curry, DO as PCP - General (Geriatric Medicine) Well [facility]  Extended Emergency Contact Information Primary Emergency Contact: [facility] Address: [address]          [place], [place] [address] [name] of [place] Phone: [phone] Mobile Phone: [phone] Relation: Daughter Secondary Emergency Contact: [facility] Address: [address]          [place], [place] [address] [name] of [place] Phone: [phone] Mobile Phone: [phone] Relation: Spouse  Code Status:  DNR Goals of care: Advanced Directive information Advanced Directives 10/06/2015  Does patient have an advance directive? Yes  Type of Paramedic of [place];Living will  Does patient want to make changes to advanced directive? -  Copy of advanced directive(s) in chart? Yes  Pre-existing out of facility DNR order (yellow form or pink MOST form) -     Chief Complaint  Patient presents with  . Medical Management of Chronic Issues    routine visit    HPI:  Pt is a 82 y.o. male seen today for medical management of chronic diseases.  He has vascular dementia, prior stroke with right hemiparesis, htn, bipolar disorder, constipation, bph and CKDII among others.    When seen, he had no complaints.  He feels he is doing well considering.  He has notable short term memory loss in the course of our visit, repeating himself often.  He continues to enjoy regular visits from his wife who lives in [place].    His DMII has been under better control recently despite the seroquel.  He's on diet only at this time. He'd been losing weight and metformin had to be discontinued.    He  remains on seroquel and clomipramine (TCA) for his bipolar which have been longstanding meds.  Moods are stable.  He does have glaucoma which can be affected by the clomipramine.  Will need to discuss with his daughter.  This also can affect cognition.    BP well controlled with lopressor only.  Constipation managed with miralax and senokot s successfully.    CVA:  Continues on baby asa, had bleeding with short plavix trial before.  Not on statin at his age/comorbidities.  Vascular dementia:  Continues on aricept alone.    BPH:  Passing urine ok with flomax.  Past Medical History  Diagnosis Date  . Stroke [facility]) 08/2009    Left side with residual deficit  . Hypertension   . Bipolar 1 disorder ([place])   . Dementia   . Diabetes ([place])   . Pressure ulcer of sacrum 12/07/2012  . Anemia, iron deficiency 12/30/2012  . CKD (chronic kidney disease), stage II 07/09/2012    07/16/12:   stage II chronic kidney disease by GFR.  Follow lab at interval    . Mixed Alzheimer's and vascular dementia 12/01/2012    06/07/12: STML re: CVA- contribute to debility/dependence. Pt. is aware of deficit. Will benefit from [place] environment for assistance, supervision, socialization    07/16/12:  Admission MDS reviewed: BIMS score 13/15,  functional status: Pt requires limited to extensive assist with all ADLs except eating (supervision)    . GI bleed   . Basal cell carcinoma   .  Squamous cell carcinoma of skin of scalp 01/24/14    anterior and posterior scalp    Past Surgical History  Procedure Laterality Date  . Hip arthroplasty Left 12/01/2012    Procedure: ARTHROPLASTY BIPOLAR HIP;  Surgeon: Johnn Hai, MD;  Location: [facility];  Service: Orthopedics;  Laterality: Left;  . Skin lesion excision  01/24/14    Anterior and posterio scalp Squamous cell [name] PA    No Known Allergies    Medication List       This list is accurate as of: 10/06/15 11:59 PM.  Always use your most recent med list.                acetaminophen 325 MG tablet  Commonly known as:  TYLENOL  Take 650 mg by mouth every 6 (six) hours as needed for pain.     ANAFRANIL 25 MG capsule  Generic drug:  clomiPRAMINE  Take 25 mg by mouth every morning.     antiseptic oral rinse Liqd  15 mLs by Mouth Rinse route 4 (four) times daily.     aspirin 81 MG tablet  Take 81 mg by mouth daily.     cholecalciferol 1000 units tablet  Commonly known as:  VITAMIN D  Take 1,000 Units by mouth every morning.     donepezil 10 MG tablet  Commonly known as:  ARICEPT  Take 10 mg by mouth daily.     latanoprost 0.005 % ophthalmic solution  Commonly known as:  XALATAN  Place 1 drop into both eyes at bedtime.     metoprolol tartrate 25 MG tablet  Commonly known as:  LOPRESSOR  Take 25 mg by mouth 2 (two) times daily.     multivitamin with minerals Tabs tablet  Take 1 tablet by mouth daily.     polyethylene glycol packet  Commonly known as:  MIRALAX / GLYCOLAX  Take 17 g by mouth daily.     QUEtiapine 25 MG tablet  Commonly known as:  SEROQUEL  Take 25 mg by mouth at bedtime.     sennosides-docusate sodium 8.6-50 MG tablet  Commonly known as:  SENOKOT-S  Take 1 tablet by mouth daily.     tamsulosin 0.4 MG Caps capsule  Commonly known as:  FLOMAX  Take 0.4 mg by mouth daily after breakfast.        Review of Systems  Constitutional: Negative for fever, activity change, appetite change, fatigue and unexpected weight change.  HENT: Positive for hearing loss. Negative for congestion.   Eyes: Negative for visual disturbance.       Has glaucoma on drops, wears glasses  Respiratory: Negative for chest tightness and shortness of breath.   Cardiovascular: Negative for chest pain, palpitations and leg swelling.  Gastrointestinal: Positive for constipation. Negative for abdominal pain, blood in stool and abdominal distention.  Genitourinary: Negative for dysuria and decreased urine volume.  Musculoskeletal: Positive for  arthralgias.  Skin: Negative for color change.  Neurological: Positive for weakness. Negative for dizziness and light-headedness.       Right hemiparesis  Psychiatric/Behavioral: Positive for confusion. Negative for behavioral problems and dysphoric mood.    Immunization History  Administered Date(s) Administered  . Influenza Whole 03/13/2013  . Influenza-Unspecified 03/04/2014, 03/12/2015  . PPD Test 07/05/2012   Pertinent  Health Maintenance Due  Topic Date Due  . FOOT EXAM  01/30/1939  . OPHTHALMOLOGY EXAM  01/30/1939  . URINE MICROALBUMIN  01/30/1939  . PNA vac Low Risk Adult (1  of 2 - PCV13) 01/29/1994  . HEMOGLOBIN A1C  06/18/2015  . INFLUENZA VACCINE  12/29/2015   Fall Risk  05/29/2015 03/16/2015 02/05/2015 10/20/2014  Falls in the past year? No No No No  Risk for fall due to : History of fall(s) - - -   Functional Status Survey: Is the patient deaf or have difficulty hearing?: Yes Does the patient have difficulty seeing, even when wearing glasses/contacts?: No Does the patient have difficulty concentrating, remembering, or making decisions?: Yes Does the patient have difficulty walking or climbing stairs?: Yes Does the patient have difficulty dressing or bathing?: Yes Does the patient have difficulty doing errands alone such as visiting a doctor's office or shopping?: Yes  Filed Vitals:   10/06/15 1508  BP: 115/76  Pulse: 81  Temp: 98.2 F (36.8 C)  TempSrc: Oral  Resp: 18  Weight: 161 lb (73.029 kg)  SpO2: 98%   Body mass index is 22.46 kg/(m^2). Physical Exam  Constitutional: He appears well-nourished. No distress.  Eyes:  glasses  Cardiovascular: Normal rate, regular rhythm, normal heart sounds and intact distal pulses.   Pulmonary/Chest: Effort normal and breath sounds normal.  Abdominal: Soft. He exhibits no distension. There is no tenderness.  Genitourinary: Penile tenderness present.  Musculoskeletal:  Right hemiparesis, brace on right leg    Neurological: He is alert.  Oriented to person and place  Skin: Skin is warm and dry.  Psychiatric: He has a normal mood and affect.    Labs reviewed:  Recent Labs  12/16/14 04/20/15 11/05/15  NA 143 141 139  K 4.2 4.5 4.5  BUN 28* 28* 28*  CREATININE 1.5* 1.5* 1.6*   No results for input(s): AST, ALT, ALKPHOS, BILITOT, PROT, ALBUMIN in the last 8760 hours.  Recent Labs  07/07/15 09/18/15 1551 11/05/15  WBC 7.5 8.7 8.6  HGB 10.6* 11.6* 9.7*  HCT 31* 34* 33*  PLT 170 217 168   Lab Results  Component Value Date   TSH 1.18 10/21/2014   Lab Results  Component Value Date   HGBA1C 6.7* 12/16/2014   Lab Results  Component Value Date   CHOL 152 06/27/2013   HDL 55 06/27/2013   LDLCALC 78 06/27/2013   TRIG 83 06/27/2013    Assessment/Plan 1. Type 2 diabetes mellitus with stage 2 chronic kidney disease, without long-term current use of insulin (HCC) -last hba1c well controlled, cont diet only and monitor twice a year due to seroquel  2. Mixed Alzheimer's and vascular dementia -gradually declining memory -dependent functionally due to his stroke except with feeding -continues on aricept  3. Constipation, unspecified constipation type Stable with miralax and senna s, cont same and monitor  4. Essential hypertension Controlled with lopressor, cont same and monitor  5. CKD (chronic kidney disease), stage II -avoid nephrotoxic agents, maintain hydration, might consider swapping his beta blocker for an ace but will not tolerate addition with his bp  6. BPH (benign prostatic hyperplasia) -cont flomax  7. Bipolar disorder, in full remission, most recent episode mixed ([place]) -consider coming off TCA due to potential effects on glaucoma and cognition and replace with mild ssri like celexa (zoloft and wellbutrin might be too stimulating and throw him into mania)  Family/ staff Communication: discussed with snf nursing, need to call [facility] Silversmith in f/u  Labs/tests ordered:   Needs hba1c at least 2x per year

## 2015-10-08 LAB — HEMOGLOBIN A1C: Hemoglobin A1C: 7.3

## 2015-11-05 ENCOUNTER — Encounter: Payer: Self-pay | Admitting: Adult Health

## 2015-11-05 ENCOUNTER — Non-Acute Institutional Stay (SKILLED_NURSING_FACILITY): Payer: Medicare Other | Admitting: Adult Health

## 2015-11-05 DIAGNOSIS — E1122 Type 2 diabetes mellitus with diabetic chronic kidney disease: Secondary | ICD-10-CM

## 2015-11-05 DIAGNOSIS — N182 Chronic kidney disease, stage 2 (mild): Secondary | ICD-10-CM | POA: Diagnosis not present

## 2015-11-05 DIAGNOSIS — D638 Anemia in other chronic diseases classified elsewhere: Secondary | ICD-10-CM

## 2015-11-05 DIAGNOSIS — R55 Syncope and collapse: Secondary | ICD-10-CM | POA: Diagnosis not present

## 2015-11-05 LAB — BASIC METABOLIC PANEL
BUN: 28 mg/dL — AB (ref 4–21)
CREATININE: 1.6 mg/dL — AB (ref 0.6–1.3)
Glucose: 269 mg/dL
Potassium: 4.5 mmol/L (ref 3.4–5.3)
Sodium: 139 mmol/L (ref 137–147)

## 2015-11-05 LAB — CBC AND DIFFERENTIAL
HEMATOCRIT: 33 % — AB (ref 41–53)
Hemoglobin: 9.7 g/dL — AB (ref 13.5–17.5)
PLATELETS: 168 10*3/uL (ref 150–399)
WBC: 8.6 10^3/mL

## 2015-11-05 NOTE — Progress Notes (Signed)
Location:  Occupational psychologist of Service:  SNF (31) Provider:   Cindi Carbon, ANP [place] [phone]   REED, Jonelle Sidle, DO  Patient Care Team: Gayland Curry, DO as PCP - General (Geriatric Medicine) Well [facility]  Extended Emergency Contact Information Primary Emergency Contact: [facility] Address: [address] [place], [place] [address] [name] of [place] Phone: [phone] Mobile Phone: [phone] Relation: Daughter Secondary Emergency Contact: [facility] Address: [place]          [place],  [address] [name] of [place] Phone: [phone] Mobile Phone: [phone] Relation: Spouse  Code Status:  DNR Goals of care: Advanced Directive information Advanced Directives 10/06/2015  Does patient have an advance directive? Yes  Type of Paramedic of [place];Living will  Does patient want to make changes to advanced directive? -  Copy of advanced directive(s) in chart? Yes  Pre-existing out of facility DNR order (yellow form or pink MOST form) -     Chief Complaint  Patient presents with  . Acute Visit    syncope    HPI:  Pt is a 82 y.o. male seen today for syncope as well as medical management of chronic diseases.  Resident resides in skilled care due to previous CVA with left sided weakness.  1. Syncope and collapse Called to room after syncopal episode and dry heaviing X 2 while pt on commode. Currently resting in bed. Arouses to commands and answers questions. Denied pain. Blood sugar obtained (277) and O2 100%. EKG obtained and reviewed.  2. Anemia of chronic disease Last HBG 11.6, now 9.7 as of 11/05/15. No reports of blood in stool or abdominal pain. Currently taking daily ASA.  3. CKD (chronic kidney disease), stage II BP controlled on Lopressor. Baseline Cr 1.5  4. Type 2 diabetes mellitus with stage 2 chronic kidney  disease, without long-term current use of insulin (HCC)  CBGs range 173-216. Currently not treated with medications. No reported episodes of hypo- or hyperglycemic.   Past Medical History  Diagnosis Date  . Stroke [facility]) 08/2009    Left side with residual deficit  . Hypertension   . Bipolar 1 disorder ([place])   . Dementia   . Diabetes ([place])   . Pressure ulcer of sacrum 12/07/2012  . Anemia, iron deficiency 12/30/2012  . CKD (chronic kidney disease), stage II 07/09/2012    07/16/12:   stage II chronic kidney disease by GFR.  Follow lab at interval    . Mixed Alzheimer's and vascular dementia 12/01/2012    06/07/12: STML re: CVA- contribute to debility/dependence. Pt. is aware of deficit. Will benefit from [place] environment for assistance, supervision, socialization    07/16/12:  Admission MDS reviewed: BIMS score 13/15,  functional status: Pt requires limited to extensive assist with all ADLs except eating (supervision)    . GI bleed   . Basal cell carcinoma   . Squamous cell carcinoma of skin of scalp 01/24/14    anterior and posterior scalp    Past Surgical History  Procedure Laterality Date  . Hip arthroplasty Left 12/01/2012    Procedure: ARTHROPLASTY BIPOLAR HIP;  Surgeon: Johnn Hai, MD;  [facility];  Service: Orthopedics;  Laterality: Left;  . Skin lesion excision  01/24/14    Anterior and posterio scalp Squamous cell [name] PA    No Known Allergies    Medication List  This list is accurate as of: 11/05/15  2:38 PM.  Always use your most recent med list.               acetaminophen 325 MG tablet  Commonly known as:  TYLENOL  Take 650 mg by mouth every 6 (six) hours as needed for pain.     ANAFRANIL 25 MG capsule  Generic drug:  clomiPRAMINE  Take 25 mg by mouth every morning.     antiseptic oral rinse Liqd  15 mLs by Mouth Rinse route 4 (four) times daily.     aspirin 81 MG tablet  Take 81 mg by mouth daily.     cholecalciferol 1000 units  tablet  Commonly known as:  VITAMIN D  Take 1,000 Units by mouth every morning.     donepezil 10 MG tablet  Commonly known as:  ARICEPT  Take 10 mg by mouth daily.     latanoprost 0.005 % ophthalmic solution  Commonly known as:  XALATAN  Place 1 drop into both eyes at bedtime.     metoprolol tartrate 25 MG tablet  Commonly known as:  LOPRESSOR  Take 25 mg by mouth 2 (two) times daily.     multivitamin with minerals Tabs tablet  Take 1 tablet by mouth daily.     polyethylene glycol packet  Commonly known as:  MIRALAX / GLYCOLAX  Take 17 g by mouth daily.     QUEtiapine 25 MG tablet  Commonly known as:  SEROQUEL  Take 25 mg by mouth at bedtime.     sennosides-docusate sodium 8.6-50 MG tablet  Commonly known as:  SENOKOT-S  Take 1 tablet by mouth daily.     tamsulosin 0.4 MG Caps capsule  Commonly known as:  FLOMAX  Take 0.4 mg by mouth daily after breakfast.        Review of Systems  Constitutional: Positive for diaphoresis. Negative for fever, appetite change and fatigue.  Respiratory: Negative.  Negative for cough, choking, shortness of breath and wheezing.   Cardiovascular: Negative for chest pain.  Gastrointestinal: Positive for vomiting. Negative for abdominal pain, constipation, blood in stool and abdominal distention.  Neurological: Positive for syncope. Negative for facial asymmetry, speech difficulty and numbness.  Psychiatric/Behavioral: Negative for behavioral problems and agitation.    Immunization History  Administered Date(s) Administered  . Influenza Whole 03/13/2013  . Influenza-Unspecified 03/04/2014, 03/12/2015  . PPD Test 07/05/2012   Pertinent  Health Maintenance Due  Topic Date Due  . FOOT EXAM  01/30/1939  . OPHTHALMOLOGY EXAM  01/30/1939  . URINE MICROALBUMIN  01/30/1939  . PNA vac Low Risk Adult (1 of 2 - PCV13) 01/29/1994  . HEMOGLOBIN A1C  06/18/2015  . INFLUENZA VACCINE  12/29/2015   Fall Risk  05/29/2015 03/16/2015 02/05/2015  10/20/2014  Falls in the past year? No No No No  Risk for fall due to : History of fall(s) - - -   Functional Status Survey:    Filed Vitals:   11/05/15 1403  BP: 116/69  Pulse: 98  Temp: 98.1 F (36.7 C)  Resp: 20  SpO2: 93%   There is no weight on file to calculate BMI. Physical Exam  Constitutional: He appears well-developed and well-nourished.  Eyes: EOM are normal. Pupils are equal, round, and reactive to light. Right eye exhibits no discharge. Left eye exhibits no discharge.  Neck: No JVD present.  Cardiovascular: Normal rate, regular rhythm, normal heart sounds and intact distal pulses.   No murmur heard. No  carotid bruit noted  Pulmonary/Chest: Effort normal and breath sounds normal. No respiratory distress. He has no wheezes.  Abdominal: Soft. Bowel sounds are normal. He exhibits no distension. There is no tenderness. There is no guarding.  Musculoskeletal:  Residual left sided weakness.   Neurological: He is alert.  Slight left facial droop unchanged, no new neuro findings. Alert to self only.  Skin: Skin is warm. He is diaphoretic. There is pallor.  Psychiatric: He has a normal mood and affect. His behavior is normal.    Labs reviewed:  Recent Labs  12/16/14 04/20/15 11/05/15  NA 143 141 139  K 4.2 4.5 4.5  BUN 28* 28* 28*  CREATININE 1.5* 1.5* 1.6*   No results for input(s): AST, ALT, ALKPHOS, BILITOT, PROT, ALBUMIN in the last 8760 hours.  Recent Labs  07/07/15 09/18/15 1551 11/05/15  WBC 7.5 8.7 8.6  HGB 10.6* 11.6* 9.7*  HCT 31* 34* 33*  PLT 170 217 168   Lab Results  Component Value Date   TSH 1.18 10/21/2014   Lab Results  Component Value Date   HGBA1C 6.7* 12/16/2014   Lab Results  Component Value Date   CHOL 152 06/27/2013   HDL 55 06/27/2013   LDLCALC 78 06/27/2013   TRIG 83 06/27/2013    Significant Diagnostic Results in last 30 days:  No results found.  Assessment/Plan 1. Syncope and collapse Vital signs every 8 hours for  48 hours. EKG obtained with no bradycardia or ST elevation.  CMP obtained today with no gross abnormalities. This most likely represents a vaso vagal episode. He has returned to baseline, will continue to monitor  2. Anemia of chronic disease Obtain CBC today with decrease in HGB noted from 11.6 (4/17) to 9.7 (today). Hemocult stools X 3 and reevaluate ASA therapy.  3. CKD (chronic kidney disease), stage II BP well maintained on Lopressor. Continue at current dose. Cr today 1.6.  4. Type 2 diabetes mellitus with stage 2 chronic kidney disease, without long-term current use of insulin (HCC) Serum glucose 269 today. A1C 7/16- 6.7. Will reevaluate A1C next month for possible medication therapy. Goal A1C of <8.  Family/ staff Communication: discussed with staff  Labs/tests ordered:  completed

## 2015-11-10 ENCOUNTER — Non-Acute Institutional Stay (SKILLED_NURSING_FACILITY): Payer: Medicare Other | Admitting: Internal Medicine

## 2015-11-10 ENCOUNTER — Encounter: Payer: Self-pay | Admitting: Internal Medicine

## 2015-11-10 DIAGNOSIS — S30810A Abrasion of lower back and pelvis, initial encounter: Secondary | ICD-10-CM

## 2015-11-10 DIAGNOSIS — I6990 Unspecified sequelae of unspecified cerebrovascular disease: Secondary | ICD-10-CM

## 2015-11-10 DIAGNOSIS — S30813A Abrasion of scrotum and testes, initial encounter: Secondary | ICD-10-CM | POA: Diagnosis not present

## 2015-11-10 NOTE — Progress Notes (Signed)
Patient ID: George Kemp, male   DOB: April 16, 1927, 82 y.o.   MRN: [id number]  Location:  [facility] Room Number: [id number]  Place of Service:  SNF (31) Provider: Mardene Lessig L. Mariea Clonts, D.O., C.M.D.  Hollace Kinnier, DO  Patient Care Team: Gayland Curry, DO as PCP - General (Geriatric Medicine) [facility]  Extended Emergency Contact Information Primary Emergency Contact: [facility] Address: [address]          [place], [place] [address] [name] Phone: [phone] Mobile Phone: [phone] Relation: Daughter Secondary Emergency Contact: [facility] Address: [place]          [place], [place] [address] [name] of [name] Phone: [phone] Mobile Phone: [phone] Relation: Spouse  Code Status:  DNR Goals of care: Advanced Directive information Advanced Directives 11/10/2015  Does patient have an advance directive? Yes  Type of Advance Directive Out of facility DNR (pink MOST or yellow form);[name];Living will  Copy of advanced directive(s) in chart? Yes  Pre-existing out of facility DNR order (yellow form or pink MOST form) Yellow form placed in chart (order not valid for inpatient use)     Chief Complaint  Patient presents with  . Acute Visit    wound on buttocks    HPI:  Pt is a 82 y.o. male seen today for an acute visit for wounds on his buttocks.  Nursing has been using skin prep and last night nystatin powder was added for some concerns of yeast.  He's been getting mepilex dressings over the wounds on his right buttocks which may have been more pressure, but these appear to be abrasions from sliding his body across the wheelchair or bed and involve his upper posterior thighs, buttocks, posterior scrotum/taint region.  They are irregularly shaped not oval or round like pressure areas typically are.  There is no surrounding erythema present.  He is incontinent  of urine and stool and the mepilex do not stay on well.   Past Medical History  Diagnosis Date  . Stroke [facility]) 08/2009    Left side with residual deficit  . Hypertension   . Bipolar 1 disorder ([place])   . Dementia   . Diabetes ([place])   . Pressure ulcer of sacrum 12/07/2012  . Anemia, iron deficiency 12/30/2012  . CKD (chronic kidney disease), stage II 07/09/2012    07/16/12:   stage II chronic kidney disease by GFR.  Follow lab at interval    . Mixed Alzheimer's and vascular dementia 12/01/2012    06/07/12: STML re: CVA- contribute to debility/dependence. Pt. is aware of deficit. Will benefit from [place] environment for assistance, supervision, socialization    07/16/12:  Admission MDS reviewed: BIMS score 13/15,  functional status: Pt requires limited to extensive assist with all ADLs except eating (supervision)    . GI bleed   . Basal cell carcinoma   . Squamous cell carcinoma of skin of scalp 01/24/14    anterior and posterior scalp    Past Surgical History  Procedure Laterality Date  . Hip arthroplasty Left 12/01/2012    Procedure: ARTHROPLASTY BIPOLAR HIP;  Surgeon: Johnn Hai, MD;  Location: [facility];  Service: Orthopedics;  Laterality: Left;  . Skin lesion excision  01/24/14    Anterior and posterio scalp Squamous cell [name] PA    No Known Allergies    Medication List       This list is accurate as  of: 11/10/15  2:30 PM.  Always use your most recent med list.               acetaminophen 325 MG tablet  Commonly known as:  TYLENOL  Take 650 mg by mouth every 6 (six) hours as needed for pain.     ANAFRANIL 25 MG capsule  Generic drug:  clomiPRAMINE  Take 25 mg by mouth every morning.     antiseptic oral rinse Liqd  15 mLs by Mouth Rinse route 4 (four) times daily.     aspirin 81 MG tablet  Take 81 mg by mouth daily.     CENTRUM SILVER 50+MEN Tabs  Take by mouth daily.     cholecalciferol 1000 units tablet  Commonly known as:  VITAMIN D  Take 1,000  Units by mouth every morning.     donepezil 10 MG tablet  Commonly known as:  ARICEPT  Take 10 mg by mouth daily.     ketoconazole 2 % shampoo  Commonly known as:  NIZORAL  Apply 1 application topically 2 (two) times a week.     latanoprost 0.005 % ophthalmic solution  Commonly known as:  XALATAN  Place 1 drop into both eyes at bedtime.     metoprolol tartrate 25 MG tablet  Commonly known as:  LOPRESSOR  Take 25 mg by mouth 2 (two) times daily.     polyethylene glycol packet  Commonly known as:  MIRALAX / GLYCOLAX  Take 17 g by mouth daily.     QUEtiapine 25 MG tablet  Commonly known as:  SEROQUEL  Take 25 mg by mouth at bedtime.     sennosides-docusate sodium 8.6-50 MG tablet  Commonly known as:  SENOKOT-S  Take 1 tablet by mouth daily.     tamsulosin 0.4 MG Caps capsule  Commonly known as:  FLOMAX  Take 0.4 mg by mouth daily after breakfast.        Review of Systems  Constitutional: Negative for fever and chills.  Respiratory: Negative for shortness of breath.   Cardiovascular: Negative for chest pain.  Gastrointestinal: Negative for abdominal pain.  Genitourinary: Negative for dysuria.       Incontinence  Skin: Positive for wound.  Neurological: Positive for weakness and numbness.  Psychiatric/Behavioral: Positive for confusion. Negative for agitation.    Immunization History  Administered Date(s) Administered  . Influenza Whole 03/13/2013  . Influenza-Unspecified 03/04/2014, 03/12/2015  . PPD Test 07/05/2012   Pertinent  Health Maintenance Due  Topic Date Due  . FOOT EXAM  01/30/1939  . OPHTHALMOLOGY EXAM  01/30/1939  . URINE MICROALBUMIN  01/30/1939  . PNA vac Low Risk Adult (1 of 2 - PCV13) 01/29/1994  . HEMOGLOBIN A1C  06/18/2015  . INFLUENZA VACCINE  12/29/2015   Fall Risk  05/29/2015 03/16/2015 02/05/2015 10/20/2014  Falls in the past year? No No No No  Risk for fall due to : History of fall(s) - - -    Filed Vitals:   11/10/15 1421  BP:  111/72  Pulse: 96  Temp: 97.2 F (36.2 C)  TempSrc: Oral  Resp: 18  Weight: 160 lb (72.576 kg)  SpO2: 99%   Body mass index is 22.33 kg/(m^2). Physical Exam  Constitutional: No distress.  Cardiovascular: Normal heart sounds.   Pulmonary/Chest: Effort normal.  Abdominal: Soft. Bowel sounds are normal.  Musculoskeletal:  Right hemiparesis  Neurological: He is alert.  Skin: No rash noted. No erythema.  Right coccyx and buttock wound beneath buttocks and  right upper posterior thigh--excoriation/abrasion of these areas and also of posterior scrotum    Labs reviewed:  Recent Labs  12/16/14 04/20/15 11/05/15  NA 143 141 139  K 4.2 4.5 4.5  BUN 28* 28* 28*  CREATININE 1.5* 1.5* 1.6*   No results for input(s): AST, ALT, ALKPHOS, BILITOT, PROT, ALBUMIN in the last 8760 hours.  Recent Labs  07/07/15 09/18/15 1551 11/05/15  WBC 7.5 8.7 8.6  HGB 10.6* 11.6* 9.7*  HCT 31* 34* 33*  PLT 170 217 168   Lab Results  Component Value Date   TSH 1.18 10/21/2014   Lab Results  Component Value Date   HGBA1C 6.7* 12/16/2014   Lab Results  Component Value Date   CHOL 152 06/27/2013   HDL 55 06/27/2013   LDLCALC 78 06/27/2013   TRIG 83 06/27/2013    Assessment/Plan 1. Abrasion of buttock, right, initial encounter -continue with skin prep after bathing/cleansing after bms/incontinent spells, d/c nystatin as no signs of yeast visible, may continue mepilex on right coccyx and buttock (but they are not sticking well)  2. Abrasion of scrotum and testes, initial encounter -same as above--use skin prep  3. Late effects of cerebrovascular disease -requires turning and skin susceptible to abrasions especially with urinary incontinence/depend use, needs barrier cream  Family/ staff Communication: discussed with snf nurse and nurse manager  Labs/tests ordered:  No new

## 2015-12-21 ENCOUNTER — Encounter: Payer: Self-pay | Admitting: Adult Health

## 2015-12-21 ENCOUNTER — Non-Acute Institutional Stay (SKILLED_NURSING_FACILITY): Payer: Medicare Other | Admitting: Adult Health

## 2015-12-21 DIAGNOSIS — I6990 Unspecified sequelae of unspecified cerebrovascular disease: Secondary | ICD-10-CM

## 2015-12-21 DIAGNOSIS — F0150 Vascular dementia without behavioral disturbance: Secondary | ICD-10-CM

## 2015-12-21 DIAGNOSIS — F0280 Dementia in other diseases classified elsewhere without behavioral disturbance: Secondary | ICD-10-CM

## 2015-12-21 DIAGNOSIS — I10 Essential (primary) hypertension: Secondary | ICD-10-CM

## 2015-12-21 DIAGNOSIS — D638 Anemia in other chronic diseases classified elsewhere: Secondary | ICD-10-CM

## 2015-12-21 DIAGNOSIS — G309 Alzheimer's disease, unspecified: Secondary | ICD-10-CM | POA: Diagnosis not present

## 2015-12-21 DIAGNOSIS — E1122 Type 2 diabetes mellitus with diabetic chronic kidney disease: Secondary | ICD-10-CM

## 2015-12-21 DIAGNOSIS — N182 Chronic kidney disease, stage 2 (mild): Secondary | ICD-10-CM

## 2015-12-21 NOTE — Progress Notes (Signed)
Location:   Customer service manager of Service:  SNF (31) Provider:   Cindi Carbon, ANP [place] [phone]  REED, Jonelle Sidle, DO  Patient Care Team: Gayland Curry, DO as PCP - General (Geriatric Medicine) [facility]  Extended Emergency Contact Information Primary Emergency Contact: [facility] Address: [address] [place], [place] [address] [name] of [place] Phone: [phone] Mobile Phone: [phone] Relation: Daughter Secondary Emergency Contact: [facility] Address: [address]          [place], [place] [address] [name] of [place] Phone: [phone] Mobile Phone: [phone] Relation: Spouse  Code Status:  DNR Goals of care: Advanced Directive information Advanced Directives 11/10/2015  Does patient have an advance directive? Yes  Type of Advance Directive Out of facility DNR (pink MOST or yellow form);[place];Living will  Does patient want to make changes to advanced directive? -  Copy of advanced directive(s) in chart? Yes  Pre-existing out of facility DNR order (yellow form or pink MOST form) Yellow form placed in chart (order not valid for inpatient use)     Chief Complaint  Patient presents with  . Medical Management of Chronic Issues    HPI:  Pt is a 82 y.o. male seen today for medical management of chronic diseases.  Has a hx of CVA with residual left sided hemiparesis.  Has had overall functional/cognitive decline in the past year. MMSE 10/20/14 20/30  1. Anemia of chronic disease H/H trending downward, 9.7 on 11/05/15, was 11.6 on 09/18/15 Hemoccult neg x 2 in June, one positive No overt bleeding on aspirin  2. CKD (chronic kidney disease), stage II Lab Results  Component Value Date   BUN 28 (A) 11/05/2015   Lab Results  Component Value Date   CREATININE 1.6 (A) 11/05/2015  stable  3. Mixed Alzheimer's and vascular dementia Progressive decline,  on aricept  4. Type 2 diabetes mellitus with stage 2 chronic kidney disease, without long-term current use of insulin (HC CBGS 190-234 Not on meds On glucerna BID for healing of pressure ulcer  5. Essential hypertension Controlled on lopressor No sob or cp  6. Late effects of cerebrovascular disease H/o TIA episodes [facility] at [facility], as well as CVA with residual left sided weakness Now has worsening expressive aphasia over the past year Still able to participate in activities   Past Medical History:  Diagnosis Date  . Anemia, iron deficiency 12/30/2012  . Basal cell carcinoma   . Bipolar 1 disorder ([place])   . CKD (chronic kidney disease), stage II 07/09/2012   07/16/12:   stage II chronic kidney disease by GFR.  Follow lab at interval    . Dementia   . Diabetes ([place])   . GI bleed   . Hypertension   . 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 Pt. is aware of deficit. Will benefit from [place] environment for assistance, supervision, socialization    07/16/12:  Admission MDS reviewed: BIMS score 13/15,  functional status: Pt requires limited to extensive assist with all ADLs except eating (supervision)    . Pressure ulcer of sacrum 12/07/2012  . Squamous cell carcinoma of skin of scalp 01/24/14   anterior and posterior scalp   . Stroke [facility]) 08/2009   Left side with residual deficit   Past Surgical History:  Procedure Laterality Date  . HIP ARTHROPLASTY Left 12/01/2012  Procedure: ARTHROPLASTY BIPOLAR HIP;  Surgeon: Johnn Hai, MD;  Location: [facility];  Service: Orthopedics;  Laterality: Left;  . SKIN LESION EXCISION  01/24/14   Anterior and posterio scalp Squamous cell [name] PA    No Known Allergies    Medication List       Accurate as of 12/21/15  3:26 PM. Always use your most recent med list.          acetaminophen 325 MG tablet Commonly known as:  TYLENOL Take 650 mg by mouth every 6  (six) hours as needed for pain.   ANAFRANIL 25 MG capsule Generic drug:  clomiPRAMINE Take 25 mg by mouth every morning.   antiseptic oral rinse Liqd 15 mLs by Mouth Rinse route 4 (four) times daily.   aspirin 81 MG tablet Take 81 mg by mouth daily.   CENTRUM SILVER 50+MEN Tabs Take by mouth daily.   cholecalciferol 1000 units tablet Commonly known as:  VITAMIN D Take 1,000 Units by mouth every morning.   donepezil 10 MG tablet Commonly known as:  ARICEPT Take 10 mg by mouth daily.   GLUCERNA Liqd Take 237 mLs by mouth 2 (two) times daily between meals.   ketoconazole 2 % shampoo Commonly known as:  NIZORAL Apply 1 application topically 2 (two) times a week.   latanoprost 0.005 % ophthalmic solution Commonly known as:  XALATAN Place 1 drop into both eyes at bedtime.   metoprolol tartrate 25 MG tablet Commonly known as:  LOPRESSOR Take 25 mg by mouth 2 (two) times daily.   polyethylene glycol packet Commonly known as:  MIRALAX / GLYCOLAX Take 17 g by mouth daily.   QUEtiapine 25 MG tablet Commonly known as:  SEROQUEL Take 25 mg by mouth at bedtime.   sennosides-docusate sodium 8.6-50 MG tablet Commonly known as:  SENOKOT-S Take 1 tablet by mouth daily.   tamsulosin 0.4 MG Caps capsule Commonly known as:  FLOMAX Take 0.4 mg by mouth daily after breakfast.       Review of Systems  Constitutional: Negative for activity change, appetite change, chills, diaphoresis, fatigue, fever and unexpected weight change.  Eyes: Negative for visual disturbance.  Respiratory: Negative for cough, shortness of breath, wheezing and stridor.   Cardiovascular: Negative for chest pain, palpitations and leg swelling.  Gastrointestinal: Negative for abdominal distention, abdominal pain, constipation and diarrhea.  Genitourinary: Negative for difficulty urinating and dysuria.  Musculoskeletal: Positive for gait problem. Negative for arthralgias, back pain, joint swelling and  myalgias.  Neurological: Positive for speech difficulty. Negative for dizziness, seizures, syncope and headaches.  Hematological: Negative for adenopathy. Does not bruise/bleed easily.  Psychiatric/Behavioral: Positive for confusion. Negative for agitation and behavioral problems.    Immunization History  Administered Date(s) Administered  . Influenza Whole 03/13/2013  . Influenza-Unspecified 03/04/2014, 03/12/2015  . PPD Test 07/05/2012   Pertinent  Health Maintenance Due  Topic Date Due  . FOOT EXAM  01/30/1939  . OPHTHALMOLOGY EXAM  01/30/1939  . URINE MICROALBUMIN  01/30/1939  . PNA vac Low Risk Adult (1 of 2 - PCV13) 01/29/1994  . HEMOGLOBIN A1C  06/18/2015  . INFLUENZA VACCINE  12/29/2015   Fall Risk  05/29/2015 03/16/2015 02/05/2015 10/20/2014  Falls in the past year? No No No No  Risk for fall due to : History of fall(s) - - -   Functional Status Survey:    Vitals:   12/21/15 1514  BP: 110/67  Pulse: 93  Resp: 18  Temp: 97 F (36.1  C)  SpO2: 94%  Weight: 157 lb 12.8 oz (71.6 kg)   Body mass index is 22.01 kg/m.  Wt Readings from Last 3 Encounters:  12/21/15 157 lb 12.8 oz (71.6 kg)  11/10/15 160 lb (72.6 kg)  10/06/15 161 lb (73 kg)    Physical Exam  Constitutional: No distress.  HENT:  Head: Normocephalic and atraumatic.  Neck: No JVD present.  Cardiovascular: Normal rate and regular rhythm.   No murmur heard. Pulmonary/Chest: Effort normal and breath sounds normal.  Abdominal: Soft. Bowel sounds are normal.  Musculoskeletal:  Left sided weakness  Neurological: He is alert.  Oriented to self and place  Skin: Skin is warm and dry. He is not diaphoretic. There is pallor.  Psychiatric: He has a normal mood and affect.    Labs reviewed:  Recent Labs  04/20/15 11/05/15  NA 141 139  K 4.5 4.5  BUN 28* 28*  CREATININE 1.5* 1.6*   No results for input(s): AST, ALT, ALKPHOS, BILITOT, PROT, ALBUMIN in the last 8760 hours.  Recent Labs  07/07/15  09/18/15 1551 11/05/15  WBC 7.5 8.7 8.6  HGB 10.6* 11.6* 9.7*  HCT 31* 34* 33*  PLT 170 217 168   Lab Results  Component Value Date   TSH 1.18 10/21/2014   Lab Results  Component Value Date   HGBA1C 7.3 10/08/2015   Lab Results  Component Value Date   CHOL 152 06/27/2013   HDL 55 06/27/2013   LDLCALC 78 06/27/2013   TRIG 83 06/27/2013    Significant Diagnostic Results in last 30 days:  No results found.  Assessment/Plan  1. Anemia of chronic disease Check iron studies  2. CKD (chronic kidney disease), stage II Continue to monitor BMP Avoid nephrotoxic agents  3. Mixed Alzheimer's and vascular dementia Progressive decline Continue aricept  4. Type 2 diabetes mellitus with stage 2 chronic kidney disease, without long-term current use of insulin (HCC) Sugars trending up but will decrease glucerna and see if this helps (wound healed to coccyx) A1C in  2 weeks  5. Essential hypertension controlled  6. Late effects of cerebrovascular disease Continue aspirin No aggressive measures due to goals of care  Family/ staff Communication: discussed with nurse  Labs/tests ordered:  A1C, TSH next draw (cold intolerance)

## 2015-12-22 LAB — TSH: TSH: 1.13 u[IU]/mL (ref 0.41–5.90)

## 2016-01-04 ENCOUNTER — Non-Acute Institutional Stay (SKILLED_NURSING_FACILITY): Payer: Medicare Other | Admitting: Adult Health

## 2016-01-04 DIAGNOSIS — R109 Unspecified abdominal pain: Secondary | ICD-10-CM

## 2016-01-04 DIAGNOSIS — E1122 Type 2 diabetes mellitus with diabetic chronic kidney disease: Secondary | ICD-10-CM | POA: Diagnosis not present

## 2016-01-04 DIAGNOSIS — N182 Chronic kidney disease, stage 2 (mild): Secondary | ICD-10-CM

## 2016-01-04 DIAGNOSIS — G309 Alzheimer's disease, unspecified: Secondary | ICD-10-CM | POA: Diagnosis not present

## 2016-01-04 DIAGNOSIS — R410 Disorientation, unspecified: Secondary | ICD-10-CM | POA: Diagnosis not present

## 2016-01-04 DIAGNOSIS — F0280 Dementia in other diseases classified elsewhere without behavioral disturbance: Secondary | ICD-10-CM

## 2016-01-04 DIAGNOSIS — F0150 Vascular dementia without behavioral disturbance: Secondary | ICD-10-CM

## 2016-01-04 LAB — BASIC METABOLIC PANEL
BUN: 29 mg/dL — AB (ref 4–21)
Creatinine: 1.8 mg/dL — AB (ref 0.6–1.3)
GLUCOSE: 240 mg/dL
POTASSIUM: 4.7 mmol/L (ref 3.4–5.3)
SODIUM: 136 mmol/L — AB (ref 137–147)

## 2016-01-04 LAB — CBC AND DIFFERENTIAL
HCT: 35 % — AB (ref 41–53)
HEMOGLOBIN: 11.9 g/dL — AB (ref 13.5–17.5)
Platelets: 252 10*3/uL (ref 150–399)
WBC: 8.8 10^3/mL

## 2016-01-04 LAB — HEMOGLOBIN A1C: HEMOGLOBIN A1C: 8

## 2016-01-04 NOTE — Progress Notes (Addendum)
Location:   Customer service manager of Service:  SNF (31) Provider:   Cindi Carbon, ANP [place] [phone]   REED, Jonelle Sidle, DO  Patient Care Team: Gayland Curry, DO as PCP - General (Geriatric Medicine) Well [facility]  Extended Emergency Contact Information Primary Emergency Contact: [facility] Address: [address] [place], [place] [address] [name] of [place] Phone: [phone] Mobile Phone: [phone] Relation: Daughter Secondary Emergency Contact: [facility] Address: [address]          [place], [place] [address] [name] of [place] Phone: [phone] Mobile Phone: [phone] Relation: Spouse  Code Status:  DNR Goals of care: Advanced Directive information Advanced Directives 11/10/2015  Does patient have an advance directive? Yes  Type of Advance Directive Out of facility DNR (pink MOST or yellow form);[place];Living will  Does patient want to make changes to advanced directive? -  Copy of advanced directive(s) in chart? Yes  Pre-existing out of facility DNR order (yellow form or pink MOST form) Yellow form placed in chart (order not valid for inpatient use)     Chief Complaint  Patient presents with  . Acute Visit    confusion    HPI:  Pt is a 82 y.o. male seen today for an acute visit for confusion and low grade temp with right flank pain. Mr. Debruyne stopped me in the hallway, very confused with some mild resp distress. He reported his right flank was hurting but denied any dysuria or abd pain. He has not had a cough or sputum production. He has a hx of DM II, CBG was 203 for the visit prior to lunch. He has a hx of CVA with mixed dementia and has been progressively less involved in conversation and activities over time. He had a low grade temp of 99.3 for our visit, and reported that the flank pain resolved. The staff reported he had experienced hallucinations  today and thought there was a rabbit crawling on his leg. He also has had cold chills.  He is on seroquel for a hx of Bipolar, dose decreased to 12.5 on 7/26.   A1C 8% today, CBGS over 200 frequently   Past Medical History:  Diagnosis Date  . Anemia, iron deficiency 12/30/2012  . Basal cell carcinoma   . Bipolar 1 disorder ([place])   . CKD (chronic kidney disease), stage II 07/09/2012   07/16/12:   stage II chronic kidney disease by GFR.  Follow lab at interval    . Dementia   . Diabetes ([place])   . GI bleed   . Hypertension   . 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 Pt. is aware of deficit. Will benefit from [place] environment for assistance, supervision, socialization    07/16/12:  Admission MDS reviewed: BIMS score 13/15,  functional status: Pt requires limited to extensive assist with all ADLs except eating (supervision)    . Pressure ulcer of sacrum 12/07/2012  . Squamous cell carcinoma of skin of scalp 01/24/14   anterior and posterior scalp   . Stroke [facility]) 08/2009   Left side with residual deficit   Past Surgical History:  Procedure Laterality Date  . HIP ARTHROPLASTY Left 12/01/2012   Procedure: ARTHROPLASTY BIPOLAR HIP;  Surgeon: Johnn Hai, MD;  Location: [facility] ORS;  Service: Orthopedics;  Laterality: Left;  . SKIN LESION EXCISION  01/24/14  Anterior and posterio scalp Squamous cell [name] PA    No Known Allergies    Medication List       Accurate as of 01/04/16  3:23 PM. Always use your most recent med list.          acetaminophen 325 MG tablet Commonly known as:  TYLENOL Take 650 mg by mouth every 6 (six) hours as needed for pain.   ANAFRANIL 25 MG capsule Generic drug:  clomiPRAMINE Take 25 mg by mouth every morning.   antiseptic oral rinse Liqd 15 mLs by Mouth Rinse route 4 (four) times daily.   aspirin 81 MG tablet Take 81 mg by mouth daily.   CENTRUM SILVER 50+MEN Tabs Take  by mouth daily.   cholecalciferol 1000 units tablet Commonly known as:  VITAMIN D Take 1,000 Units by mouth every morning.   donepezil 10 MG tablet Commonly known as:  ARICEPT Take 10 mg by mouth daily.   GLUCERNA Liqd Take 237 mLs by mouth daily.   ketoconazole 2 % shampoo Commonly known as:  NIZORAL Apply 1 application topically 2 (two) times a week.   latanoprost 0.005 % ophthalmic solution Commonly known as:  XALATAN Place 1 drop into both eyes at bedtime.   metoprolol tartrate 25 MG tablet Commonly known as:  LOPRESSOR Take 25 mg by mouth 2 (two) times daily.   polyethylene glycol packet Commonly known as:  MIRALAX / GLYCOLAX Take 17 g by mouth daily.   QUEtiapine 25 MG tablet Commonly known as:  SEROQUEL Take 12.5 mg by mouth at bedtime.   sennosides-docusate sodium 8.6-50 MG tablet Commonly known as:  SENOKOT-S Take 1 tablet by mouth daily.   tamsulosin 0.4 MG Caps capsule Commonly known as:  FLOMAX Take 0.4 mg by mouth daily after breakfast.       Review of Systems  Constitutional: Positive for activity change. Negative for appetite change, chills, diaphoresis, fatigue, fever and unexpected weight change.  HENT: Negative for congestion.   Eyes: Negative for visual disturbance.  Respiratory: Negative for cough, shortness of breath, wheezing and stridor.   Cardiovascular: Negative for chest pain, palpitations and leg swelling.  Gastrointestinal: Negative for abdominal distention, abdominal pain, constipation, diarrhea and nausea.  Endocrine: Positive for cold intolerance. Negative for heat intolerance, polydipsia, polyphagia and polyuria.  Genitourinary: Positive for flank pain. Negative for difficulty urinating, discharge, dysuria, frequency and urgency.  Musculoskeletal: Positive for gait problem. Negative for arthralgias, back pain, joint swelling and myalgias.  Neurological: Positive for speech difficulty (on going issue due to CVA). Negative for  dizziness, seizures, syncope, facial asymmetry, weakness and headaches.       Left sided weakness from previous CVA  Hematological: Negative for adenopathy. Does not bruise/bleed easily.  Psychiatric/Behavioral: Positive for agitation, confusion and hallucinations. Negative for behavioral problems. The patient is nervous/anxious.     Immunization History  Administered Date(s) Administered  . Influenza Whole 03/13/2013  . Influenza-Unspecified 03/04/2014, 03/12/2015  . PPD Test 07/05/2012   Pertinent  Health Maintenance Due  Topic Date Due  . FOOT EXAM  01/30/1937  . OPHTHALMOLOGY EXAM  01/30/1937  . URINE MICROALBUMIN  01/30/1937  . PNA vac Low Risk Adult (1 of 2 - PCV13) 01/29/1994  . INFLUENZA VACCINE  12/29/2015  . HEMOGLOBIN A1C  04/09/2016   Fall Risk  05/29/2015 03/16/2015 02/05/2015 10/20/2014  Falls in the past year? No No No No  Risk for fall due to : History of fall(s) - - -   Functional Status  Survey:    Vitals:   01/04/16 1223  BP: 112/70  Pulse: 87  Resp: (!) 30  Temp: 99.3 F (37.4 C)  SpO2: 91%   There is no height or weight on file to calculate BMI. Physical Exam  Constitutional: He appears distressed (slight).  HENT:  Head: Normocephalic and atraumatic.  Right Ear: External ear normal.  Left Ear: External ear normal.  Nose: Nose normal.  Mouth/Throat: Oropharynx is clear and moist. No oropharyngeal exudate.  Eyes: Conjunctivae and EOM are normal. Pupils are equal, round, and reactive to light. Right eye exhibits no discharge. Left eye exhibits no discharge.  Neck: Normal range of motion. Neck supple. No JVD present. No tracheal deviation present. No thyromegaly present.  Cardiovascular: Normal rate and regular rhythm.   No murmur heard. Pulmonary/Chest: Breath sounds normal. He is in respiratory distress (slight, appears anxious). He has no wheezes.  Abdominal: Soft. Bowel sounds are normal. He exhibits no distension. There is no tenderness.  No s/p  or cva tenderness  Musculoskeletal: He exhibits no edema or tenderness.  Lymphadenopathy:    He has no cervical adenopathy.  Neurological: He is alert. No cranial nerve deficit.  Has left sided weakness but no new changes.  Difficult answering q's and finding words  Skin: Skin is warm and dry. He is not diaphoretic.  Psychiatric:  anxious    Labs reviewed:  Recent Labs  04/20/15 11/05/15  NA 141 139  K 4.5 4.5  BUN 28* 28*  CREATININE 1.5* 1.6*   No results for input(s): AST, ALT, ALKPHOS, BILITOT, PROT, ALBUMIN in the last 8760 hours.  Recent Labs  07/07/15 09/18/15 1551 11/05/15  WBC 7.5 8.7 8.6  HGB 10.6* 11.6* 9.7*  HCT 31* 34* 33*  PLT 170 217 168   Lab Results  Component Value Date   TSH 1.13 12/22/2015   Lab Results  Component Value Date   HGBA1C 7.3 10/08/2015   Lab Results  Component Value Date   CHOL 152 06/27/2013   HDL 55 06/27/2013   LDLCALC 78 06/27/2013   TRIG 83 06/27/2013    Significant Diagnostic Results in last 30 days:  No results found.  Assessment/Plan 1. Acute delirium -having hallucinations with low grade temp  -consider underlying infection vs. Drug effect with lower dose of seroquel -check labs, CXR, UA  2. Flank pain -?UTI vs pna VS qshift with 02 sat See above  3. Mixed Alzheimer's and vascular dementia Advancing with less interaction, refusing to go to activities May have underlying depression as well  4. Type 2 diabetes mellitus with stage 2 chronic kidney disease, without long-term current use of insulin (HCC) Elevated CBG at 203 before lunch A1C 8% today, CBGs frequently over 100, did not tolerate metformin, tradjenta not covered Try Lantus 5 units SQ qhs  5. CKD (chronic kidney disease), stage II Check BMP Avoid nephrotoxic agents   Family/ staff Communication: discussed with staff  Labs/tests ordered:  CBC, BMP, CXR, UA C and S

## 2016-01-05 LAB — MICROALBUMIN, URINE

## 2016-02-04 ENCOUNTER — Non-Acute Institutional Stay (SKILLED_NURSING_FACILITY): Payer: Medicare Other | Admitting: Adult Health

## 2016-02-04 ENCOUNTER — Encounter: Payer: Self-pay | Admitting: Adult Health

## 2016-02-04 DIAGNOSIS — I10 Essential (primary) hypertension: Secondary | ICD-10-CM | POA: Diagnosis not present

## 2016-02-04 DIAGNOSIS — E1122 Type 2 diabetes mellitus with diabetic chronic kidney disease: Secondary | ICD-10-CM | POA: Diagnosis not present

## 2016-02-04 DIAGNOSIS — N182 Chronic kidney disease, stage 2 (mild): Secondary | ICD-10-CM

## 2016-02-04 DIAGNOSIS — N184 Chronic kidney disease, stage 4 (severe): Secondary | ICD-10-CM | POA: Diagnosis not present

## 2016-02-04 NOTE — Progress Notes (Addendum)
Location:   Customer service manager of Service:  SNF (31) Provider:   Cindi Carbon, ANP [place] [phone]   REED, Jonelle Sidle, DO  Patient Care Team: Gayland Curry, DO as PCP - General (Geriatric Medicine) Well [facility]  Extended Emergency Contact Information Primary Emergency Contact: [facility] Address: [address] [place], [place] [address] [name] of [place] Phone: [phone] Mobile Phone: [phone] Relation: Daughter Secondary Emergency Contact: [facility] Address: [address]          [place], [place] [address] [name] of [place] Phone: [phone] Mobile Phone: [phone] Relation: Spouse  Code Status: DNR Goals of care: Advanced Directive information Advanced Directives 11/10/2015  Does patient have an advance directive? Yes  Type of Advance Directive Out of facility DNR (pink MOST or yellow form);[place];Living will  Does patient want to make changes to advanced directive? -  Copy of advanced directive(s) in chart? Yes  Pre-existing out of facility DNR order (yellow form or pink MOST form) Yellow form placed in chart (order not valid for inpatient use)     Chief Complaint  Patient presents with  . Acute Visit    elevated blood sugars    HPI:  Pt is a 82 y.o. male seen today for an acute visit for elevated blood sugars. Currently on Tradjenta and Jardiance. A1C in May was 7.3 but sugars have steadily risen, over 200 at times in the am and pm. Resident diet remains unchanged and can be non compliant. BP borderline low at 109/66 on lopressor 25 mg BID. CKD worsening over time, last BUN/CR 29/1.8 on 01/04/16.  CrCL calculated at 28.63 ml/min. MMSE 12/30 on 12/22/15  Past Medical History:  Diagnosis Date  . Anemia, iron deficiency 12/30/2012  . Basal cell carcinoma   . Bipolar 1 disorder ([place])   . CKD (chronic kidney disease), stage II 07/09/2012   07/16/12:    stage II chronic kidney disease by GFR.  Follow lab at interval    . Dementia   . Diabetes ([place])   . GI bleed   . Hypertension   . 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 Pt. is aware of deficit. Will benefit from [place] environment for assistance, supervision, socialization    07/16/12:  Admission MDS reviewed: BIMS score 13/15,  functional status: Pt requires limited to extensive assist with all ADLs except eating (supervision)    . Pressure ulcer of sacrum 12/07/2012  . Squamous cell carcinoma of skin of scalp 01/24/14   anterior and posterior scalp   . Stroke [facility]) 08/2009   Left side with residual deficit   Past Surgical History:  Procedure Laterality Date  . HIP ARTHROPLASTY Left 12/01/2012   Procedure: ARTHROPLASTY BIPOLAR HIP;  Surgeon: Johnn Hai, MD;  Location: [facility] ORS;  Service: Orthopedics;  Laterality: Left;  . SKIN LESION EXCISION  01/24/14   Anterior and posterio scalp Squamous cell [name] PA    No Known Allergies    Medication List       Accurate as of 02/04/16 11:16 AM. Always use your most recent med list.          acetaminophen 325 MG tablet Commonly known as:  TYLENOL Take 650 mg by mouth every 6 (six) hours as needed for pain.   ANAFRANIL 25 MG capsule Generic drug:  clomiPRAMINE Take 25 mg  by mouth every morning.   antiseptic oral rinse Liqd 15 mLs by Mouth Rinse route 4 (four) times daily.   aspirin 81 MG tablet Take 81 mg by mouth daily.   CENTRUM SILVER 50+MEN Tabs Take by mouth daily.   cholecalciferol 1000 units tablet Commonly known as:  VITAMIN D Take 1,000 Units by mouth every morning.   donepezil 10 MG tablet Commonly known as:  ARICEPT Take 10 mg by mouth daily.   GLUCERNA Liqd Take 237 mLs by mouth daily.   insulin aspart 100 UNIT/ML injection Commonly known as:  novoLOG Inject 5 Units into the skin 4 (four) times daily -  before meals and at  bedtime. If CBG <200   JARDIANCE 10 MG Tabs tablet Generic drug:  empagliflozin Take 10 mg by mouth daily.   ketoconazole 2 % shampoo Commonly known as:  NIZORAL Apply 1 application topically 2 (two) times a week.   latanoprost 0.005 % ophthalmic solution Commonly known as:  XALATAN Place 1 drop into both eyes at bedtime.   linagliptin 5 MG Tabs tablet Commonly known as:  TRADJENTA Take 5 mg by mouth daily.   metoprolol tartrate 25 MG tablet Commonly known as:  LOPRESSOR Take 25 mg by mouth 2 (two) times daily.   polyethylene glycol packet Commonly known as:  MIRALAX / GLYCOLAX Take 17 g by mouth daily.   QUEtiapine 25 MG tablet Commonly known as:  SEROQUEL Take 12.5 mg by mouth at bedtime.   sennosides-docusate sodium 8.6-50 MG tablet Commonly known as:  SENOKOT-S Take 1 tablet by mouth daily.   tamsulosin 0.4 MG Caps capsule Commonly known as:  FLOMAX Take 0.4 mg by mouth daily after breakfast.       Review of Systems  Constitutional: Negative for activity change, appetite change, chills, diaphoresis, fatigue, fever and unexpected weight change.  Respiratory: Negative for cough, shortness of breath, wheezing and stridor.   Cardiovascular: Positive for leg swelling. Negative for chest pain and palpitations.  Gastrointestinal: Negative for abdominal distention, abdominal pain, constipation and diarrhea.  Genitourinary: Negative for difficulty urinating and dysuria.  Musculoskeletal: Positive for arthralgias and gait problem. Negative for back pain, joint swelling and myalgias.  Neurological: Positive for weakness. Negative for dizziness, seizures, syncope, facial asymmetry, speech difficulty and headaches.  Hematological: Negative for adenopathy. Does not bruise/bleed easily.  Psychiatric/Behavioral: Positive for confusion. Negative for agitation and behavioral problems.    Immunization History  Administered Date(s) Administered  . Influenza Whole 03/13/2013  .  Influenza-Unspecified 03/04/2014, 03/12/2015  . PPD Test 07/05/2012   Pertinent  Health Maintenance Due  Topic Date Due  . FOOT EXAM  01/30/1937  . OPHTHALMOLOGY EXAM  01/30/1937  . URINE MICROALBUMIN  01/30/1937  . PNA vac Low Risk Adult (1 of 2 - PCV13) 01/29/1994  . INFLUENZA VACCINE  12/29/2015  . HEMOGLOBIN A1C  04/09/2016   Fall Risk  05/29/2015 03/16/2015 02/05/2015 10/20/2014  Falls in the past year? No No No No  Risk for fall due to : History of fall(s) - - -   Functional Status Survey:    Vitals:   02/04/16 1056  BP: 109/66  Pulse: 66  Resp: 20  Temp: 98.6 F (37 C)  SpO2: 91%  Weight: 153 lb 9.6 oz (69.7 kg)   Body mass index is 21.42 kg/m. Physical Exam  Constitutional: No distress.  HENT:  Head: Normocephalic and atraumatic.  Right Ear: External ear normal.  Left Ear: External ear normal.  Nose: Nose normal.  Mouth/Throat:  Oropharynx is clear and moist. No oropharyngeal exudate.  Eyes: Conjunctivae and EOM are normal. Pupils are equal, round, and reactive to light. Right eye exhibits no discharge. Left eye exhibits no discharge.  Cardiovascular: Normal rate and regular rhythm.   No murmur heard. Pulmonary/Chest: Effort normal and breath sounds normal.  Abdominal: Soft. Bowel sounds are normal.  Musculoskeletal:  Left sided weakness  Neurological: He is alert.  Oriented to self, situation, place, not time  Skin: Skin is warm and dry. He is not diaphoretic. There is pallor.  Psychiatric: He has a normal mood and affect.    Labs reviewed:  Recent Labs  04/20/15 11/05/15 01/04/16  NA 141 139 136*  K 4.5 4.5 4.7  BUN 28* 28* 29*  CREATININE 1.5* 1.6* 1.8*   No results for input(s): AST, ALT, ALKPHOS, BILITOT, PROT, ALBUMIN in the last 8760 hours.  Recent Labs  09/18/15 1551 11/05/15 01/04/16  WBC 8.7 8.6 8.8  HGB 11.6* 9.7* 11.9*  HCT 34* 33* 35*  PLT 217 168 252   Lab Results  Component Value Date   TSH 1.13 12/22/2015   Lab Results    Component Value Date   HGBA1C 7.3 10/08/2015   Lab Results  Component Value Date   CHOL 152 06/27/2013   HDL 55 06/27/2013   LDLCALC 78 06/27/2013   TRIG 83 06/27/2013    Significant Diagnostic Results in last 30 days:  No results found.  Assessment/Plan 1. Type 2 diabetes mellitus with stage 2 chronic kidney disease, without long-term current use of insulin (HCC) Levemir 5 units BID SQ (ins does not cover lantus) Novolog 5 units AC/HS >200, needs bedtime snack D/C Jardiance and Tradjenta due lack of control of blood sugars with recent UTI Goal A1C <8%  2. CKD (chronic kidney disease), stage IV Slight worsening over time, no longer appropriate for ACE inhibitor Continue to monitor BMP and avoid nephrotoxic agents  3. Essential hypertension BP borderline low at 109/66, asymptomatic Continue lopressor 25 mg BID    Family/ staff Communication: discussed with staff  Labs/tests ordered:  Monitor BMP and A1C (check next visit)

## 2016-03-29 ENCOUNTER — Non-Acute Institutional Stay (SKILLED_NURSING_FACILITY): Payer: Medicare Other | Admitting: Internal Medicine

## 2016-03-29 ENCOUNTER — Encounter: Payer: Self-pay | Admitting: Internal Medicine

## 2016-03-29 DIAGNOSIS — F0150 Vascular dementia without behavioral disturbance: Secondary | ICD-10-CM

## 2016-03-29 DIAGNOSIS — N182 Chronic kidney disease, stage 2 (mild): Secondary | ICD-10-CM

## 2016-03-29 DIAGNOSIS — I6990 Unspecified sequelae of unspecified cerebrovascular disease: Secondary | ICD-10-CM

## 2016-03-29 DIAGNOSIS — F3178 Bipolar disorder, in full remission, most recent episode mixed: Secondary | ICD-10-CM

## 2016-03-29 DIAGNOSIS — I10 Essential (primary) hypertension: Secondary | ICD-10-CM | POA: Diagnosis not present

## 2016-03-29 DIAGNOSIS — K5901 Slow transit constipation: Secondary | ICD-10-CM

## 2016-03-29 DIAGNOSIS — F0280 Dementia in other diseases classified elsewhere without behavioral disturbance: Secondary | ICD-10-CM

## 2016-03-29 DIAGNOSIS — G309 Alzheimer's disease, unspecified: Secondary | ICD-10-CM | POA: Diagnosis not present

## 2016-03-29 DIAGNOSIS — E1122 Type 2 diabetes mellitus with diabetic chronic kidney disease: Secondary | ICD-10-CM

## 2016-03-29 NOTE — Progress Notes (Signed)
Patient ID: George Kemp, male   DOB: 1926/11/25, 82 y.o.   MRN: [id number]  Location:  [place] Room Number: [id number] Place of Service:  SNF (31) Provider:  Fidela Cieslak L. Mariea Clonts, D.O., C.M.D.  Hollace Kinnier, DO  Patient Care Team: George Curry, DO as PCP - General (Geriatric Medicine) Well [facility]  Extended Emergency Contact Information Primary Emergency Contact: [facility] Address: [address]          [place], [place] [address] [name] of [place] Phone: [phone] Mobile Phone: [phone] Relation: Daughter Secondary Emergency Contact: [facility] Address: [address]          [place], [place] [address] [name] of [place] Phone: [phone] Mobile Phone: [phone] Relation: Spouse  Code Status:  DNR Goals of care: Advanced Directive information Advanced Directives 03/29/2016  Does patient have an advance directive? Yes  Type of Advance Directive Out of facility DNR (pink MOST or yellow form);[place];Living will  Does patient want to make changes to advanced directive? -  Copy of advanced directive(s) in chart? Yes  Pre-existing out of facility DNR order (yellow form or pink MOST form) Yellow form placed in chart (order not valid for inpatient use)     Chief Complaint  Patient presents with  . Medical Management of Chronic Issues    routine visit    HPI:  Pt is a 82 y.o. male seen today for medical management of chronic diseases.   DMII:  On jardiance and tradjenta.   Lab Results  Component Value Date   HGBA1C 8 01/04/2016  not adherent with diet at all times.    Bipolar disorder:  Remains on seroquel which can bump glucose also, but mood has been stable so we've been hesitant to change anything.  Also on anafranil which I would get rid of first if I was picking.  CKD IV:  Being monitored and avoiding nephrotoxic agents.  Vascular dementia mixed with AD  likely:  Remains on aricept, not on namenda.  Clear short term memory loss, but could be affected by TCA and elevated sugars at times.     Past Medical History:  Diagnosis Date  . Anemia, iron deficiency 12/30/2012  . Basal cell carcinoma   . Bipolar 1 disorder ([place])   . CKD (chronic kidney disease), stage II 07/09/2012   07/16/12:   stage II chronic kidney disease by GFR.  Follow lab at interval    . Dementia   . Diabetes ([place])   . GI bleed   . Hypertension   . 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 Pt. is aware of deficit. Will benefit from [place] environment for assistance, supervision, socialization    07/16/12:  Admission MDS reviewed: BIMS score 13/15,  functional status: Pt requires limited to extensive assist with all ADLs except eating (supervision)    . Pressure ulcer of sacrum 12/07/2012  . Squamous cell carcinoma of skin of scalp 01/24/14   anterior and posterior scalp   . Stroke [facility]) 08/2009   Left side with residual deficit   Past Surgical History:  Procedure Laterality Date  . HIP ARTHROPLASTY Left 12/01/2012   Procedure: ARTHROPLASTY BIPOLAR HIP;  Surgeon: Johnn Hai, MD;  Location: [facility];  Service: Orthopedics;  Laterality: Left;  . SKIN LESION EXCISION  01/24/14   Anterior and posterio scalp Squamous cell [name] PA    No Known  Allergies    Medication List       Accurate as of 03/29/16  2:18 PM. Always use your most recent med list.          acetaminophen 325 MG tablet Commonly known as:  TYLENOL Take 650 mg by mouth every 6 (six) hours as needed for pain.   ANAFRANIL 25 MG capsule Generic drug:  clomiPRAMINE Take 25 mg by mouth every morning.   antiseptic oral rinse Liqd 15 mLs by Mouth Rinse route 4 (four) times daily.   aspirin 81 MG tablet Take 81 mg by mouth daily.   CENTRUM SILVER 50+MEN Tabs Take by mouth daily.   cholecalciferol 1000 units tablet Commonly  known as:  VITAMIN D Take 1,000 Units by mouth every morning.   donepezil 10 MG tablet Commonly known as:  ARICEPT Take 10 mg by mouth daily.   GLUCERNA Liqd Take 237 mLs by mouth daily.   ketoconazole 2 % shampoo Commonly known as:  NIZORAL Apply 1 application topically 2 (two) times a week.   latanoprost 0.005 % ophthalmic solution Commonly known as:  XALATAN Place 1 drop into both eyes at bedtime.   LEVEMIR FLEXTOUCH 100 UNIT/ML Pen Generic drug:  Insulin Detemir Inject 5 Units into the skin 2 (two) times daily. Hold for CBG <85   metoprolol tartrate 25 MG tablet Commonly known as:  LOPRESSOR Take 25 mg by mouth 2 (two) times daily.   polyethylene glycol packet Commonly known as:  MIRALAX / GLYCOLAX Take 17 g by mouth daily.   QUEtiapine 25 MG tablet Commonly known as:  SEROQUEL Take 12.5 mg by mouth at bedtime.   sennosides-docusate sodium 8.6-50 MG tablet Commonly known as:  SENOKOT-S Take 1 tablet by mouth daily.   tamsulosin 0.4 MG Caps capsule Commonly known as:  FLOMAX Take 0.4 mg by mouth daily after breakfast.       Review of Systems  Constitutional: Negative for activity change, appetite change, chills and fever.  HENT: Negative for congestion.   Eyes: Negative for visual disturbance.       Glasses  Respiratory: Negative for shortness of breath.   Cardiovascular: Negative for chest pain and leg swelling.  Gastrointestinal: Positive for constipation. Negative for abdominal pain, diarrhea, nausea and vomiting.  Genitourinary: Negative for dysuria.  Musculoskeletal:       Not ambulatory, uses wheelchair since stroke with hemiparesis  Skin: Negative for color change.  Neurological: Positive for weakness. Negative for dizziness.       No changes  Psychiatric/Behavioral: Positive for confusion. Negative for sleep disturbance.    Immunization History  Administered Date(s) Administered  . Influenza Inj Mdck Quad Pf 03/17/2016  . Influenza Whole  03/13/2013  . Influenza-Unspecified 03/04/2014, 03/12/2015  . PPD Test 07/05/2012   Pertinent  Health Maintenance Due  Topic Date Due  . FOOT EXAM  01/30/1939  . OPHTHALMOLOGY EXAM  01/30/1939  . URINE MICROALBUMIN  01/30/1939  . PNA vac Low Risk Adult (1 of 2 - PCV13) 01/29/1994  . HEMOGLOBIN A1C  04/09/2016  . INFLUENZA VACCINE  Completed   Fall Risk  05/29/2015 03/16/2015 02/05/2015 10/20/2014  Falls in the past year? No No No No  Risk for fall due to : History of fall(s) - - -   Functional Status Survey:    Vitals:   03/29/16 1409  BP: 105/65  Pulse: 77  Resp: 20  Temp: 97.3 F (36.3 C)  TempSrc: Oral  SpO2: 91%   There is no height  or weight on file to calculate BMI. Physical Exam  Constitutional: He appears well-nourished. No distress.  Cardiovascular: Normal rate and regular rhythm.   Pulmonary/Chest: Effort normal and breath sounds normal.  Abdominal: Soft. Bowel sounds are normal.  Musculoskeletal:  Right hemiparesis  Neurological: He is alert.  Skin: Skin is warm and dry.  Psychiatric: He has a normal mood and affect.    Labs reviewed:  Recent Labs  04/20/15 11/05/15 01/04/16  NA 141 139 136*  K 4.5 4.5 4.7  BUN 28* 28* 29*  CREATININE 1.5* 1.6* 1.8*   No results for input(s): AST, ALT, ALKPHOS, BILITOT, PROT, ALBUMIN in the last 8760 hours.  Recent Labs  09/18/15 1551 11/05/15 01/04/16  WBC 8.7 8.6 8.8  HGB 11.6* 9.7* 11.9*  HCT 34* 33* 35*  PLT 217 168 252   Lab Results  Component Value Date   TSH 1.13 12/22/2015   Lab Results  Component Value Date   HGBA1C 7.3 10/08/2015   Lab Results  Component Value Date   CHOL 152 06/27/2013   HDL 55 06/27/2013   LDLCALC 78 06/27/2013   TRIG 83 06/27/2013    Assessment/Plan 1. Type 2 diabetes mellitus with stage 2 chronic kidney disease, without long-term current use of insulin (HCC) -tried to use orals to avoid having to do too many CBGs and give insulin injections, but glucose readings  trended up due to dietary nonadherence, an infection last month, ongoing seroquel -is now back on insulin therapy with 4 cbgs per day and 4 injections if his mealtimes are over 200 -cont to monitor -goal hba1c about 8 due to age and comorbidities  2. Mixed Alzheimer's and vascular dementia -is progressing gradually -cont aricept for now, will need to discuss more with his daughter  3. 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 Bipolar disorder, in full remission, most recent episode mixed ([place]) -continues on tca and seroquel for this longstanding -has been pretty stable from this perspective so, despite beers meds, we've opted not to "rock the boat"   5. Slow transit constipation -cont current regimen which has been effective, encourage hydration with water  6. Essential hypertension -bp on low side, cont lopressor and monitor, off ace due to progression of ckd  Family/ staff Communication: discussed with snf nursing  Labs/tests ordered:  No new

## 2016-04-25 ENCOUNTER — Non-Acute Institutional Stay (SKILLED_NURSING_FACILITY): Payer: Medicare Other | Admitting: Adult Health

## 2016-04-25 ENCOUNTER — Encounter: Payer: Self-pay | Admitting: Adult Health

## 2016-04-25 DIAGNOSIS — N182 Chronic kidney disease, stage 2 (mild): Secondary | ICD-10-CM | POA: Diagnosis not present

## 2016-04-25 DIAGNOSIS — G309 Alzheimer's disease, unspecified: Secondary | ICD-10-CM

## 2016-04-25 DIAGNOSIS — I10 Essential (primary) hypertension: Secondary | ICD-10-CM

## 2016-04-25 DIAGNOSIS — F0150 Vascular dementia without behavioral disturbance: Secondary | ICD-10-CM

## 2016-04-25 DIAGNOSIS — I679 Cerebrovascular disease, unspecified: Secondary | ICD-10-CM

## 2016-04-25 DIAGNOSIS — F0280 Dementia in other diseases classified elsewhere without behavioral disturbance: Secondary | ICD-10-CM | POA: Diagnosis not present

## 2016-04-25 DIAGNOSIS — E1122 Type 2 diabetes mellitus with diabetic chronic kidney disease: Secondary | ICD-10-CM

## 2016-04-25 NOTE — Progress Notes (Signed)
Location:   Customer service manager of Service:  SNF (31) Provider:   Cindi Carbon, ANP [place] [phone]   REED, Jonelle Sidle, DO  Patient Care Team: [place] Curry, DO as PCP - General (Geriatric Medicine) [facility]  Extended Emergency Contact Information Primary Emergency Contact: [facility] Address: [address] [place], [place] [address] [name] of [place] Phone: [phone] Mobile Phone: [phone] Relation: Daughter Secondary Emergency Contact: [facility] Address: [address]          [place], [place] [address] [name] of [place] Phone: [phone] Mobile Phone: [phone] Relation: Spouse  Code Status: DNR Goals of care: Advanced Directive information Advanced Directives 03/29/2016  Does Patient Have a Medical Advance Directive? Yes  Type of Advance Directive Out of facility DNR (pink MOST or yellow form);[place];Living will  Does patient want to make changes to medical advance directive? -  Copy of [place] in Chart? Yes  Pre-existing out of facility DNR order (yellow form or pink MOST form) Yellow form placed in chart (order not valid for inpatient use)     Chief Complaint  Patient presents with  . Medical Management of Chronic Issues    HPI:  82 y.o.male  residing at [facility], skilled care.  I am here to review his chronic medical issues.  VS have been stable over the past month. Weight has trended upward, currently at 162 lbs, (?accuracy).  No sob, increased edema or cp. DM II: A1C 8% in august, placed on levemir 5 units BID (insurance would not cover lantus or toujeo).   CBG [id number] Lunch 198-230 Supper 150-248 CKD worsening over time, last BUN/CR 29/1.8 on 01/04/16.  CrCL calculated at 28.63 ml/min. 2+ protein noted in UA so need for microalbumin MMSE 12/30 on 12/22/15 His wife recently passed away, staff  reports poor appetite.   Past Medical History:  Diagnosis Date  . Anemia, iron deficiency 12/30/2012  . Basal cell carcinoma   . Bipolar 1 disorder ([place])   . CKD (chronic kidney disease), stage II 07/09/2012   07/16/12:   stage II chronic kidney disease by GFR.  Follow lab at interval    . Dementia   . Diabetes ([place])   . GI bleed   . Hypertension   . 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 Pt. is aware of deficit. Will benefit from [place] environment for assistance, supervision, socialization    07/16/12:  Admission MDS reviewed: BIMS score 13/15,  functional status: Pt requires limited to extensive assist with all ADLs except eating (supervision)    . Pressure ulcer of sacrum 12/07/2012  . Squamous cell carcinoma of skin of scalp 01/24/14   anterior and posterior scalp   . Stroke [facility]) 08/2009   Left side with residual deficit   Past Surgical History:  Procedure Laterality Date  . HIP ARTHROPLASTY Left 12/01/2012   Procedure: ARTHROPLASTY BIPOLAR HIP;  Surgeon: Johnn Hai, MD;  [facility]: [facility];  Service: Orthopedics;  Laterality: Left;  . SKIN LESION EXCISION  01/24/14   Anterior and posterio scalp Squamous cell [name] PA    No Known Allergies    Medication List       Accurate as of 04/25/16  4:04 PM. Always use your most recent med list.          acetaminophen  325 MG tablet Commonly known as:  TYLENOL Take 650 mg by mouth every 6 (six) hours as needed for pain.   ANAFRANIL 25 MG capsule Generic drug:  clomiPRAMINE Take 25 mg by mouth every morning.   antiseptic oral rinse Liqd 15 mLs by Mouth Rinse route 4 (four) times daily.   aspirin 81 MG tablet Take 81 mg by mouth daily.   CENTRUM SILVER 50+MEN Tabs Take by mouth daily.   cholecalciferol 1000 units tablet Commonly known as:  VITAMIN D Take 1,000 Units by mouth every morning.   donepezil 10 MG tablet Commonly known as:   ARICEPT Take 10 mg by mouth daily.   GLUCERNA Liqd Take 237 mLs by mouth daily.   insulin aspart 100 UNIT/ML injection Commonly known as:  novoLOG Inject into the skin 3 (three) times daily before meals. Give 5 units if CBG >200, call if >400. Must eat >50% to administer   ketoconazole 2 % shampoo Commonly known as:  NIZORAL Apply 1 application topically 2 (two) times a week.   latanoprost 0.005 % ophthalmic solution Commonly known as:  XALATAN Place 1 drop into both eyes at bedtime.   LEVEMIR FLEXTOUCH 100 UNIT/ML Pen Generic drug:  Insulin Detemir Inject 5 Units into the skin 2 (two) times daily. Hold for CBG <85   metoprolol tartrate 25 MG tablet Commonly known as:  LOPRESSOR Take 75 mg by mouth 2 (two) times daily.   polyethylene glycol packet Commonly known as:  MIRALAX / GLYCOLAX Take 17 g by mouth daily.   QUEtiapine 25 MG tablet Commonly known as:  SEROQUEL Take 12.5 mg by mouth at bedtime.   sennosides-docusate sodium 8.6-50 MG tablet Commonly known as:  SENOKOT-S Take 1 tablet by mouth daily.   tamsulosin 0.4 MG Caps capsule Commonly known as:  FLOMAX Take 0.4 mg by mouth daily after breakfast.       Review of Systems  Constitutional: Negative for activity change, appetite change, chills, diaphoresis, fatigue, fever and unexpected weight change.  Respiratory: Negative for cough, shortness of breath, wheezing and stridor.   Cardiovascular: Positive for leg swelling. Negative for chest pain and palpitations.  Gastrointestinal: Negative for abdominal distention, abdominal pain, constipation and diarrhea.  Genitourinary: Negative for difficulty urinating and dysuria.  Musculoskeletal: Positive for arthralgias and gait problem. Negative for back pain, joint swelling and myalgias.  Neurological: Positive for weakness. Negative for dizziness, seizures, syncope, facial asymmetry, speech difficulty and headaches.  Hematological: Negative for adenopathy. Does not  bruise/bleed easily.  Psychiatric/Behavioral: Positive for confusion. Negative for agitation and behavioral problems.    Immunization History  Administered Date(s) Administered  . Influenza Inj Mdck Quad Pf 03/17/2016  . Influenza Whole 03/13/2013  . Influenza-Unspecified 03/04/2014, 03/12/2015  . PPD Test 07/05/2012   Pertinent  Health Maintenance Due  Topic Date Due  . FOOT EXAM  01/30/1939  . OPHTHALMOLOGY EXAM  01/30/1939  . URINE MICROALBUMIN  01/30/1939  . PNA vac Low Risk Adult (1 of 2 - PCV13) 01/29/1994  . HEMOGLOBIN A1C  04/09/2016  . INFLUENZA VACCINE  Completed   Fall Risk  05/29/2015 03/16/2015 02/05/2015 10/20/2014  Falls in the past year? No No No No  Risk for fall due to : History of fall(s) - - -   Functional Status Survey:    Vitals:   04/25/16 1601  BP: 130/82  Pulse: 79  Resp: 18  Temp: 99 F (37.2 C)  SpO2: 100%  Weight: 162 lb 6.4 oz (73.7 kg)  Body mass index is 22.65 kg/m.  Wt Readings from Last 3 Encounters:  04/25/16 162 lb 6.4 oz (73.7 kg)  02/04/16 153 lb 9.6 oz (69.7 kg)  12/21/15 157 lb 12.8 oz (71.6 kg)   Physical Exam  Constitutional: No distress.  HENT:  Head: Normocephalic and atraumatic.  Right Ear: External ear normal.  Left Ear: External ear normal.  Nose: Nose normal.  Mouth/Throat: Oropharynx is clear and moist. No oropharyngeal exudate.  Eyes: Conjunctivae and EOM are normal. Pupils are equal, round, and reactive to light. Right eye exhibits no discharge. Left eye exhibits no discharge.  Cardiovascular: Normal rate and regular rhythm.   No murmur heard. Pulmonary/Chest: Effort normal and breath sounds normal.  Abdominal: Soft. Bowel sounds are normal.  Musculoskeletal:  Left sided weakness  Neurological: He is alert.  Oriented to self, situation, place, not time  Skin: Skin is warm and dry. He is not diaphoretic. There is pallor.  BPPP+1, no skin ulcerations noted to feet. Normal sensation  Psychiatric: He has a  normal mood and affect.    Labs reviewed:  Recent Labs  11/05/15 01/04/16  NA 139 136*  K 4.5 4.7  BUN 28* 29*  CREATININE 1.6* 1.8*   No results for input(s): AST, ALT, ALKPHOS, BILITOT, PROT, ALBUMIN in the last 8760 hours.  Recent Labs  09/18/15 1551 11/05/15 01/04/16  WBC 8.7 8.6 8.8  HGB 11.6* 9.7* 11.9*  HCT 34* 33* 35*  PLT 217 168 252   Lab Results  Component Value Date   TSH 1.13 12/22/2015   Lab Results  Component Value Date   HGBA1C 8 01/04/2016   Lab Results  Component Value Date   CHOL 152 06/27/2013   HDL 55 06/27/2013   LDLCALC 78 06/27/2013   TRIG 83 06/27/2013    Significant Diagnostic Results in last 30 days:  No results found.  Assessment/Plan 1. Type 2 diabetes mellitus with stage 2 chronic kidney disease, without long-term current use of insulin (HCC) Increase levemir to 8 units bid D/c bed time coverage A1C in one month Needs diabetic eye exam  2. Essential hypertension Controlled Continue current meds  3. Mixed Alzheimer's and vascular dementia Progressive memory loss He continues to participate in activities so it is best to continue the aricept  4. CKD (chronic kidney disease), stage II Continue to monitor, avoid nephrotoxic agents  5. Cerebrovascular disease Continue aspirin 81 mg qd and monitor H/H periodically     Family/ staff Communication: discussed with staff  Labs/tests ordered: A1C

## 2016-05-25 LAB — HEMOGLOBIN A1C: HEMOGLOBIN A1C: 6.8

## 2016-05-26 ENCOUNTER — Non-Acute Institutional Stay (SKILLED_NURSING_FACILITY): Payer: Medicare Other | Admitting: Adult Health

## 2016-05-26 ENCOUNTER — Encounter: Payer: Self-pay | Admitting: Adult Health

## 2016-05-26 DIAGNOSIS — N400 Enlarged prostate without lower urinary tract symptoms: Secondary | ICD-10-CM

## 2016-05-26 DIAGNOSIS — K5901 Slow transit constipation: Secondary | ICD-10-CM

## 2016-05-26 DIAGNOSIS — N182 Chronic kidney disease, stage 2 (mild): Secondary | ICD-10-CM | POA: Diagnosis not present

## 2016-05-26 DIAGNOSIS — E1122 Type 2 diabetes mellitus with diabetic chronic kidney disease: Secondary | ICD-10-CM | POA: Diagnosis not present

## 2016-05-26 DIAGNOSIS — F3178 Bipolar disorder, in full remission, most recent episode mixed: Secondary | ICD-10-CM

## 2016-05-26 DIAGNOSIS — N183 Chronic kidney disease, stage 3 unspecified: Secondary | ICD-10-CM

## 2016-05-26 DIAGNOSIS — R635 Abnormal weight gain: Secondary | ICD-10-CM | POA: Diagnosis not present

## 2016-05-26 DIAGNOSIS — F0150 Vascular dementia without behavioral disturbance: Secondary | ICD-10-CM

## 2016-05-26 DIAGNOSIS — F0280 Dementia in other diseases classified elsewhere without behavioral disturbance: Secondary | ICD-10-CM

## 2016-05-26 DIAGNOSIS — G309 Alzheimer's disease, unspecified: Secondary | ICD-10-CM | POA: Diagnosis not present

## 2016-05-26 LAB — HM DIABETES FOOT EXAM

## 2016-05-26 NOTE — Progress Notes (Signed)
Location:   Customer service manager of Service:  SNF (31) Provider:   Cindi Carbon, ANP [place] [phone]   REED, Jonelle Sidle, DO  Patient Care Team: Gayland Curry, DO as PCP - General (Geriatric Medicine) [facility]  Extended Emergency Contact Information Primary Emergency Contact: [facility] Address: [address] [place], [place] [address] [name] of [place] Phone: [phone] Mobile Phone: [phone] Relation: Daughter Secondary Emergency Contact: [facility] Address: [place]          [place], [place] [address] [name] of [place] Phone: [phone] Mobile Phone: [phone] Relation: Spouse  Code Status: DNR Goals of care: Advanced Directive information Advanced Directives 05/26/2016  Does Patient Have a Medical Advance Directive? Yes  Type of Advance Directive Out of facility DNR (pink MOST or yellow form);[place];Living will  Does patient want to make changes to medical advance directive? -  Copy of [place] in Chart? Yes  Pre-existing out of facility DNR order (yellow form or pink MOST form) Yellow form placed in chart (order not valid for inpatient use)     Chief Complaint  Patient presents with  . Medical Management of Chronic Issues    HPI:  82 y.o.male  residing at [facility], skilled care.  I am here to review his chronic medical issues.  VS have been stable over the past month. Weight has trended upward, 11 lbs since in the past 4 months.   No sob, increased edema or cp. TSH 1.17 in July of 2017. DM II: A1C 8% in august, placed on levemir. A1C improved to 6.8 in Dec of 2017 (insurance would not cover lantus or toujeo).  Diabetic foot exam completed.  Eye exam on 07/06/16 CKD worsening over time, last BUN/CR 29/1.8 on 01/04/16.  CrCL calculated at 28.63 ml/min. 2+ protein noted in UA so no need for microalbumin MMSE  12/30 on 12/22/15.He has a hx of dementia as well as bipolar.  Tolerated dose reduction of Seroquel to 12.5 mg well. The nurse reports his mood has improved after a period of sadness with his wife passing. He reports that sleeps and eats well. Denies any pain.   Hx of BPH on flomax. No symptoms. No recent infections. Reports of loose stools, currently on miralax and senokot s   Past Medical History:  Diagnosis Date  . Anemia, iron deficiency 12/30/2012  . Basal cell carcinoma   . Bipolar 1 disorder ([place])   . CKD (chronic kidney disease), stage II 07/09/2012   07/16/12:   stage II chronic kidney disease by GFR.  Follow lab at interval    . Dementia   . Diabetes ([place])   . GI bleed   . Hypertension   . 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 Pt. is aware of deficit. Will benefit from [place] environment for assistance, supervision, socialization    07/16/12:  Admission MDS reviewed: BIMS score 13/15,  functional status: Pt requires limited to extensive assist with all ADLs except eating (supervision)    . Pressure ulcer of sacrum 12/07/2012  . Squamous cell carcinoma of skin of scalp 01/24/14   anterior and posterior scalp   . Stroke [facility]) 08/2009   Left side with residual deficit   Past Surgical History:  Procedure Laterality Date  . HIP ARTHROPLASTY Left 12/01/2012   Procedure: ARTHROPLASTY BIPOLAR  HIP;  Surgeon: Johnn Hai, MD;  Location: [facility]S;  Service: Orthopedics;  Laterality: Left;  . SKIN LESION EXCISION  01/24/14   Anterior and posterio scalp Squamous cell [name] PA    No Known Allergies  Allergies as of 05/26/2016   No Known Allergies     Medication List       Accurate as of 05/26/16  3:06 PM. Always use your most recent med list.          acetaminophen 325 MG tablet Commonly known as:  TYLENOL Take 650 mg by mouth every 6 (six) hours as needed for pain.   ANAFRANIL 25 MG  capsule Generic drug:  clomiPRAMINE Take 25 mg by mouth every morning.   antiseptic oral rinse Liqd 15 mLs by Mouth Rinse route 4 (four) times daily.   aspirin 81 MG tablet Take 81 mg by mouth daily.   CENTRUM SILVER 50+MEN Tabs Take by mouth daily.   cholecalciferol 1000 units tablet Commonly known as:  VITAMIN D Take 1,000 Units by mouth every morning.   donepezil 10 MG tablet Commonly known as:  ARICEPT Take 10 mg by mouth daily.   GLUCERNA Liqd Take 237 mLs by mouth daily.   insulin aspart 100 UNIT/ML injection Commonly known as:  novoLOG Inject into the skin 3 (three) times daily before meals. Give 5 units if CBG >200, call if >400. Must eat >50% to administer   ketoconazole 2 % shampoo Commonly known as:  NIZORAL Apply 1 application topically 2 (two) times a week.   latanoprost 0.005 % ophthalmic solution Commonly known as:  XALATAN Place 1 drop into both eyes at bedtime.   LEVEMIR FLEXTOUCH 100 UNIT/ML Pen Generic drug:  Insulin Detemir Inject 8 Units into the skin 2 (two) times daily. Hold for CBG <85   metoprolol tartrate 25 MG tablet Commonly known as:  LOPRESSOR Take 75 mg by mouth 2 (two) times daily.   polyethylene glycol packet Commonly known as:  MIRALAX / GLYCOLAX Take 17 g by mouth daily.   QUEtiapine 25 MG tablet Commonly known as:  SEROQUEL Take 12.5 mg by mouth at bedtime.   sennosides-docusate sodium 8.6-50 MG tablet Commonly known as:  SENOKOT-S Take 1 tablet by mouth daily.   tamsulosin 0.4 MG Caps capsule Commonly known as:  FLOMAX Take 0.4 mg by mouth daily after breakfast.       Review of Systems  Constitutional: Positive for unexpected weight change. Negative for activity change, appetite change, chills, diaphoresis, fatigue and fever.  Respiratory: Negative for cough, shortness of breath, wheezing and stridor.   Cardiovascular: Positive for leg swelling. Negative for chest pain and palpitations.  Gastrointestinal: Positive  for diarrhea. Negative for abdominal distention, abdominal pain and constipation.  Genitourinary: Negative for difficulty urinating and dysuria.  Musculoskeletal: Positive for arthralgias and gait problem. Negative for back pain, joint swelling and myalgias.  Neurological: Positive for weakness. Negative for dizziness, seizures, syncope, speech difficulty and headaches.  Hematological: Negative for adenopathy. Does not bruise/bleed easily.  Psychiatric/Behavioral: Positive for confusion. Negative for agitation and behavioral problems.    Immunization History  Administered Date(s) Administered  . Influenza Inj Mdck Quad Pf 03/17/2016  . Influenza Whole 03/13/2013  . Influenza-Unspecified 03/04/2014, 03/12/2015  . PPD Test 07/05/2012   Pertinent  Health Maintenance Due  Topic Date Due  . FOOT EXAM  01/30/1937  . OPHTHALMOLOGY EXAM  01/30/1937  . URINE MICROALBUMIN  01/30/1937  . PNA vac Low Risk Adult (1 of  2 - PCV13) 01/29/1994  . HEMOGLOBIN A1C  07/06/2016  . INFLUENZA VACCINE  Completed   Fall Risk  05/29/2015 03/16/2015 02/05/2015 10/20/2014  Falls in the past year? No No No No  Risk for fall due to : History of fall(s) - - -   Functional Status Survey:    Vitals:   05/26/16 1456  BP: 116/68  Pulse: 76  Resp: (!) 21  Temp: 97 F (36.1 C)  SpO2: 100%  Weight: 164 lb 4.8 oz (74.5 kg)   Body mass index is 22.92 kg/m.  Wt Readings from Last 3 Encounters:  05/26/16 164 lb 4.8 oz (74.5 kg)  04/25/16 162 lb 6.4 oz (73.7 kg)  02/04/16 153 lb 9.6 oz (69.7 kg)   Physical Exam  Constitutional: No distress.  HENT:  Head: Normocephalic and atraumatic.  Right Ear: External ear normal.  Left Ear: External ear normal.  Nose: Nose normal.  Mouth/Throat: Oropharynx is clear and moist. No oropharyngeal exudate.  Eyes: Conjunctivae and EOM are normal. Pupils are equal, round, and reactive to light. Right eye exhibits no discharge. Left eye exhibits no discharge.  Cardiovascular:  Normal rate and regular rhythm.   No murmur heard. Pulmonary/Chest: Effort normal and breath sounds normal.  Abdominal: Soft. Bowel sounds are normal.  Musculoskeletal:  Left sided weakness  Neurological: He is alert.  Oriented to self, situation, place, not time  Skin: Skin is warm and dry. He is not diaphoretic. There is pallor.  BPPP+1, no skin ulcerations noted to feet. Normal sensation  Psychiatric: He has a normal mood and affect.    Labs reviewed:  Recent Labs  11/05/15 01/04/16  NA 139 136*  K 4.5 4.7  BUN 28* 29*  CREATININE 1.6* 1.8*   No results for input(s): AST, ALT, ALKPHOS, BILITOT, PROT, ALBUMIN in the last 8760 hours.  Recent Labs  09/18/15 1551 11/05/15 01/04/16  WBC 8.7 8.6 8.8  HGB 11.6* 9.7* 11.9*  HCT 34* 33* 35*  PLT 217 168 252   Lab Results  Component Value Date   TSH 1.13 12/22/2015   Lab Results  Component Value Date   HGBA1C 6.8 05/25/2016   Lab Results  Component Value Date   CHOL 152 06/27/2013   HDL 55 06/27/2013   LDLCALC 78 06/27/2013   TRIG 83 06/27/2013    Significant Diagnostic Results in last 30 days:  No results found.  Assessment/Plan   Weight gain Increasing weight may be due to insulin use, along with improved appetite after a period of weight loss. Continue to monitor. No s/s of CHF  Mixed Alzheimer's and vascular dementia Progressive.  Due to functional losses would not benefit from [place].  Continue aricept as he does continue to participate in activities.  BPH (benign prostatic hyperplasia) Controlled. Continue Flomax.  Diabetes mellitus with renal manifestation Improved. Continue Levemir 8 units BID>  CKD (chronic kidney disease) stage 3, GFR 30-59 ml/min Unchanged. Continue to monitor BMP and avoid nephrotoxic agents. +protein in urine noted.   Bipolar disorder Stable mood. Tolerated seroquel reduction well. Continue anafranil 25 mg qd and seroquel at 12.5 mg qd  Constipation Change senokot s to 1  tab qhs prn constipation      Family/ staff Communication: discussed with staff

## 2016-05-30 DIAGNOSIS — N183 Chronic kidney disease, stage 3 unspecified: Secondary | ICD-10-CM | POA: Insufficient documentation

## 2016-05-30 NOTE — Assessment & Plan Note (Signed)
Stable mood. Tolerated seroquel reduction well. Continue anafranil 25 mg qd and seroquel at 12.5 mg qd

## 2016-05-30 NOTE — Assessment & Plan Note (Signed)
Unchanged. Continue to monitor BMP and avoid nephrotoxic agents. +protein in urine noted.

## 2016-05-30 NOTE — Assessment & Plan Note (Signed)
Progressive.  Due to functional losses would not benefit from [place].  Continue aricept as he does continue to participate in activities.

## 2016-05-30 NOTE — Assessment & Plan Note (Signed)
Increasing weight may be due to insulin use, along with improved appetite after a period of weight loss. Continue to monitor. No s/s of CHF

## 2016-05-30 NOTE — Assessment & Plan Note (Signed)
Improved. Continue Levemir 8 units BID>

## 2016-05-30 NOTE — Assessment & Plan Note (Signed)
Controlled. Continue Flomax.

## 2016-05-30 NOTE — Assessment & Plan Note (Signed)
Change senokot s to 1 tab qhs prn constipation

## 2016-06-30 ENCOUNTER — Non-Acute Institutional Stay (SKILLED_NURSING_FACILITY): Payer: Medicare Other | Admitting: Adult Health

## 2016-06-30 ENCOUNTER — Encounter: Payer: Self-pay | Admitting: Adult Health

## 2016-06-30 DIAGNOSIS — N183 Chronic kidney disease, stage 3 unspecified: Secondary | ICD-10-CM

## 2016-06-30 DIAGNOSIS — E1122 Type 2 diabetes mellitus with diabetic chronic kidney disease: Secondary | ICD-10-CM | POA: Diagnosis not present

## 2016-06-30 DIAGNOSIS — I10 Essential (primary) hypertension: Secondary | ICD-10-CM | POA: Diagnosis not present

## 2016-06-30 DIAGNOSIS — I6990 Unspecified sequelae of unspecified cerebrovascular disease: Secondary | ICD-10-CM | POA: Diagnosis not present

## 2016-06-30 DIAGNOSIS — N182 Chronic kidney disease, stage 2 (mild): Secondary | ICD-10-CM

## 2016-06-30 DIAGNOSIS — F0150 Vascular dementia without behavioral disturbance: Secondary | ICD-10-CM

## 2016-06-30 DIAGNOSIS — Z23 Encounter for immunization: Secondary | ICD-10-CM | POA: Diagnosis not present

## 2016-06-30 DIAGNOSIS — F0280 Dementia in other diseases classified elsewhere without behavioral disturbance: Secondary | ICD-10-CM

## 2016-06-30 DIAGNOSIS — D638 Anemia in other chronic diseases classified elsewhere: Secondary | ICD-10-CM | POA: Diagnosis not present

## 2016-06-30 DIAGNOSIS — F3178 Bipolar disorder, in full remission, most recent episode mixed: Secondary | ICD-10-CM | POA: Diagnosis not present

## 2016-06-30 DIAGNOSIS — G309 Alzheimer's disease, unspecified: Secondary | ICD-10-CM | POA: Diagnosis not present

## 2016-06-30 NOTE — Progress Notes (Signed)
Location:   Customer service manager of Service:  SNF (31) Provider:   Cindi Carbon, ANP [place] [phone]   REED, Jonelle Sidle, DO  Patient Care Team: Gayland Curry, DO as PCP - General (Geriatric Medicine) Well [facility]  Extended Emergency Contact Information Primary Emergency Contact: [facility] Address: [address] [place], [place] [address] [name] of [place] Phone: [phone] Mobile Phone: [phone] Relation: Daughter Secondary Emergency Contact: [facility] Address: [place]          [place], [place] [address] [name] of [place] Phone: [phone] Mobile Phone: [phone] Relation: Spouse  Code Status: DNR Goals of care: Advanced Directive information Advanced Directives 05/26/2016  Does Patient Have a Medical Advance Directive? Yes  Type of Advance Directive Out of facility DNR (pink MOST or yellow form);[place];Living will  Does patient want to make changes to medical advance directive? -  Copy of [place] in Chart? Yes  Pre-existing out of facility DNR order (yellow form or pink MOST form) Yellow form placed in chart (order not valid for inpatient use)     Chief Complaint  Patient presents with  . Medical Management of Chronic Issues    HPI:  83 y.o.male  residing at [facility], skilled care.  I am here to review his chronic medical issues.  VS have been stable over the past month. Weight stable for the past few visits. Unfortunately, his wife passed away a few months ago. This has been difficult for him but he seems to be doing reasonably well.  DM II: A1C 8% in august, placed on levemir. A1C improved to 6.8 in Dec of 2017 (insurance would not cover lantus or toujeo).   Eye exam on 07/06/16. CBGS am 125-184, lunch 171-229, supper 144-192.  He says he has been eating lots of girl scout cookies. MMSE 12/30 on  12/22/15. BP controlled with metoprolol 75 mg BID, not on ACE due to age/goals of care Anemia: due to CKD, also hx of IDA, last H/H improved. Needs CBC Notes indicate one episode of agitation in the past two weeks. He remains on Seroquel and Anafranil due to hx of bipolar, also has vascular dementia.  Past Medical History:  Diagnosis Date  . Anemia, iron deficiency 12/30/2012  . Basal cell carcinoma   . Bipolar 1 disorder ([place])   . CKD (chronic kidney disease), stage II 07/09/2012   07/16/12:   stage II chronic kidney disease by GFR.  Follow lab at interval    . Dementia   . Diabetes ([place])   . GI bleed   . Hypertension   . 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 Pt. is aware of deficit. Will benefit from [place] environment for assistance, supervision, socialization    07/16/12:  Admission MDS reviewed: BIMS score 13/15,  functional status: Pt requires limited to extensive assist with all ADLs except eating (supervision)    . Pressure ulcer of sacrum 12/07/2012  . Squamous cell carcinoma of skin of scalp 01/24/14   anterior and posterior scalp   . Stroke [facility]) 08/2009   Left side with residual deficit   Past Surgical History:  Procedure Laterality Date  . HIP ARTHROPLASTY Left 12/01/2012   Procedure: ARTHROPLASTY BIPOLAR HIP;  Surgeon: Johnn Hai, MD;  Location: [facility];  Service: Orthopedics;  Laterality: Left;  .  SKIN LESION EXCISION  01/24/14   Anterior and posterio scalp Squamous cell [name] PA    No Known Allergies  Allergies as of 06/30/2016   No Known Allergies     Medication List       Accurate as of 06/30/16  4:33 PM. Always use your most recent med list.          acetaminophen 325 MG tablet Commonly known as:  TYLENOL Take 650 mg by mouth every 6 (six) hours as needed for pain.   ANAFRANIL 25 MG capsule Generic drug:  clomiPRAMINE Take 25 mg by mouth every morning.   antiseptic oral  rinse Liqd 15 mLs by Mouth Rinse route 4 (four) times daily.   aspirin 81 MG tablet Take 81 mg by mouth daily.   CENTRUM SILVER 50+MEN Tabs Take by mouth daily.   cholecalciferol 1000 units tablet Commonly known as:  VITAMIN D Take 1,000 Units by mouth every morning.   donepezil 10 MG tablet Commonly known as:  ARICEPT Take 10 mg by mouth daily.   GLUCERNA Liqd Take 237 mLs by mouth daily.   insulin aspart 100 UNIT/ML injection Commonly known as:  novoLOG Inject into the skin 3 (three) times daily before meals. Give 5 units if CBG >200, call if >400. Must eat >50% to administer   ketoconazole 2 % shampoo Commonly known as:  NIZORAL Apply 1 application topically 2 (two) times a week.   latanoprost 0.005 % ophthalmic solution Commonly known as:  XALATAN Place 1 drop into both eyes at bedtime.   LEVEMIR FLEXTOUCH 100 UNIT/ML Pen Generic drug:  Insulin Detemir Inject 8 Units into the skin 2 (two) times daily. Hold for CBG <85   metoprolol tartrate 25 MG tablet Commonly known as:  LOPRESSOR Take 75 mg by mouth 2 (two) times daily.   polyethylene glycol packet Commonly known as:  MIRALAX / GLYCOLAX Take 17 g by mouth daily.   QUEtiapine 25 MG tablet Commonly known as:  SEROQUEL Take 12.5 mg by mouth at bedtime.   sennosides-docusate sodium 8.6-50 MG tablet Commonly known as:  SENOKOT-S Take 1 tablet by mouth daily.   tamsulosin 0.4 MG Caps capsule Commonly known as:  FLOMAX Take 0.4 mg by mouth daily after breakfast.       Review of Systems  Constitutional: Negative for chills, diaphoresis, fever, malaise/fatigue and weight loss.  Respiratory: Negative for cough, hemoptysis, sputum production and wheezing.   Cardiovascular: Positive for leg swelling. Negative for chest pain and PND.  Gastrointestinal: Negative for abdominal pain, constipation, diarrhea, nausea and vomiting.  Genitourinary: Negative for dysuria and urgency.  Musculoskeletal: Negative for back  pain, falls, joint pain, myalgias and neck pain.  Skin: Negative for itching and rash.  Neurological: Positive for focal weakness (left sided weakness from previou scva). Negative for weakness.  Psychiatric/Behavioral: Positive for memory loss. Negative for depression.     Immunization History  Administered Date(s) Administered  . Influenza Inj Mdck Quad Pf 03/17/2016  . Influenza Whole 03/13/2013  . Influenza-Unspecified 03/04/2014, 03/12/2015  . PPD Test 07/05/2012   Pertinent  Health Maintenance Due  Topic Date Due  . FOOT EXAM  01/30/1937  . OPHTHALMOLOGY EXAM  01/30/1937  . URINE MICROALBUMIN  01/30/1937  . PNA vac Low Risk Adult (1 of 2 - PCV13) 01/29/1994  . HEMOGLOBIN A1C  11/23/2016  . INFLUENZA VACCINE  Completed   Fall Risk  05/29/2015 03/16/2015 02/05/2015 10/20/2014  Falls in the past year? No No No No  Risk for fall due to : History of fall(s) - - -   Functional Status Survey:    Vitals:   06/30/16 1622  BP: 113/67  Pulse: 71  Resp: 18  Temp: 97.1 F (36.2 C)  SpO2: 99%  Weight: 163 lb 8 oz (74.2 kg)   Body mass index is 22.8 kg/m.  Wt Readings from Last 3 Encounters:  06/30/16 163 lb 8 oz (74.2 kg)  05/26/16 164 lb 4.8 oz (74.5 kg)  04/25/16 162 lb 6.4 oz (73.7 kg)   Physical Exam  Constitutional: No distress.  HENT:  Head: Normocephalic and atraumatic.  Neck: Normal range of motion. Neck supple. No JVD present. No tracheal deviation present. No thyromegaly present.  Cardiovascular: Normal rate and regular rhythm.   No murmur heard. Pulmonary/Chest: Effort normal and breath sounds normal. No respiratory distress. He has no wheezes.  Abdominal: Soft. Bowel sounds are normal. He exhibits no distension. There is no tenderness.  Lymphadenopathy:    He has no cervical adenopathy.  Neurological: He is alert.  Left sided weakness. Able to follow commands, alert to self, place, situation (sometimes), not time  Skin: Skin is warm and dry. He is not  diaphoretic.  Psychiatric: He has a normal mood and affect.    Labs reviewed:  Recent Labs  11/05/15 01/04/16  NA 139 136*  K 4.5 4.7  BUN 28* 29*  CREATININE 1.6* 1.8*   No results for input(s): AST, ALT, ALKPHOS, BILITOT, PROT, ALBUMIN in the last 8760 hours.  Recent Labs  09/18/15 1551 11/05/15 01/04/16  WBC 8.7 8.6 8.8  HGB 11.6* 9.7* 11.9*  HCT 34* 33* 35*  PLT 217 168 252   Lab Results  Component Value Date   TSH 1.13 12/22/2015   Lab Results  Component Value Date   HGBA1C 6.8 05/25/2016   Lab Results  Component Value Date   CHOL 152 06/27/2013   HDL 55 06/27/2013   LDLCALC 78 06/27/2013   TRIG 83 06/27/2013    Significant Diagnostic Results in last 30 days:  No results found.  Assessment/Plan  1. Mixed Alzheimer's and vascular dementia Progressive cognitive decline Remains on aricept, would not benefit from [place]  2. Bipolar disorder, in full remission, most recent episode mixed ([place]) -would not reduce meds due to report of agitation and recent loss of his wife  3. Type 2 diabetes mellitus with stage 2 chronic kidney disease, without long-term current use of insulin (HCC) A1C at goal Continue Levemir 8 units BID Give 5 units of novolog if CBG >200 with meals  4. Essential hypertension Controlled Continue metoprolol 75 mg BID  5. CKD (chronic kidney disease) stage 3, GFR 30-59 ml/min Needs BMP  6. Anemia of chronic disease Check CBC  7. Late effects of cerebrovascular disease Continue ASA 81 mg, did not tolerate Plavix due to dropping H/H Would not monitor lipids due to goals of care  8. Immunization due Tdap and Prevnar ordered     Family/ staff Communication: discussed with staff  Cindi Carbon, [place] [phone]

## 2016-07-01 LAB — CBC AND DIFFERENTIAL
HCT: 33 % — AB (ref 41–53)
Hemoglobin: 11.3 g/dL — AB (ref 13.5–17.5)
PLATELETS: 168 10*3/uL (ref 150–399)
WBC: 8.2 10*3/mL

## 2016-07-01 LAB — BASIC METABOLIC PANEL
BUN: 29 mg/dL — AB (ref 4–21)
CREATININE: 1.3 mg/dL (ref 0.6–1.3)
GLUCOSE: 101 mg/dL
POTASSIUM: 4.6 mmol/L (ref 3.4–5.3)
Sodium: 144 mmol/L (ref 137–147)

## 2016-07-06 LAB — HM DIABETES EYE EXAM

## 2016-07-07 ENCOUNTER — Encounter: Payer: Self-pay | Admitting: Internal Medicine

## 2016-08-25 ENCOUNTER — Non-Acute Institutional Stay (SKILLED_NURSING_FACILITY): Payer: Medicare Other | Admitting: Adult Health

## 2016-08-25 DIAGNOSIS — N183 Chronic kidney disease, stage 3 unspecified: Secondary | ICD-10-CM

## 2016-08-25 DIAGNOSIS — F0280 Dementia in other diseases classified elsewhere without behavioral disturbance: Secondary | ICD-10-CM | POA: Diagnosis not present

## 2016-08-25 DIAGNOSIS — E1122 Type 2 diabetes mellitus with diabetic chronic kidney disease: Secondary | ICD-10-CM

## 2016-08-25 DIAGNOSIS — I6990 Unspecified sequelae of unspecified cerebrovascular disease: Secondary | ICD-10-CM

## 2016-08-25 DIAGNOSIS — I10 Essential (primary) hypertension: Secondary | ICD-10-CM | POA: Diagnosis not present

## 2016-08-25 DIAGNOSIS — G309 Alzheimer's disease, unspecified: Secondary | ICD-10-CM | POA: Diagnosis not present

## 2016-08-25 DIAGNOSIS — N182 Chronic kidney disease, stage 2 (mild): Secondary | ICD-10-CM

## 2016-08-25 DIAGNOSIS — N400 Enlarged prostate without lower urinary tract symptoms: Secondary | ICD-10-CM | POA: Diagnosis not present

## 2016-08-25 DIAGNOSIS — F3178 Bipolar disorder, in full remission, most recent episode mixed: Secondary | ICD-10-CM | POA: Diagnosis not present

## 2016-08-25 DIAGNOSIS — F0150 Vascular dementia without behavioral disturbance: Secondary | ICD-10-CM

## 2016-08-25 LAB — HM DIABETES FOOT EXAM

## 2016-09-07 ENCOUNTER — Encounter: Payer: Self-pay | Admitting: Adult Health

## 2016-09-07 NOTE — Progress Notes (Signed)
Location:   Customer service manager of Service:  SNF (31) Provider:   Cindi Carbon, ANP [place] [phone]   REED, Jonelle Sidle, DO  Patient Care Team: Gayland Curry, DO as PCP - General (Geriatric Medicine) [facility]  Extended Emergency Contact Information Primary Emergency Contact: [facility] Address: [address] [place], [place] [address] [name] of [place] Phone: [phone] Mobile Phone: [phone] Relation: Daughter Secondary Emergency Contact: [facility] Address: [address]          [place], [place] [address] [name] of [place] Phone: [phone] Mobile Phone: [phone] Relation: Spouse  Code Status: DNR Goals of care: Advanced Directive information Advanced Directives 09/07/2016  Does Patient Have a Medical Advance Directive? Yes  Type of Paramedic of [place];Living will;Out of facility DNR (pink MOST or yellow form)  Does patient want to make changes to medical advance directive? No - Patient declined  Copy of [place] in Chart? Yes  Pre-existing out of facility DNR order (yellow form or pink MOST form) Yellow form placed in chart (order not valid for inpatient use)     Chief Complaint  Patient presents with  . Medical Management of Chronic Issues    HPI:  83 y.o.male  residing at [facility], skilled care.  I am here to review his chronic medical issues.  VS have been stable over the past month. Weight stable at 167.4 lbs.  DM II: A1C 8% in august, placed on levemir. A1C improved to 6.8 in Dec of 2017 (insurance would not cover lantus or toujeo).   Eye exam on 07/06/16. CBGS am 114-164, supper 147-190.   Mixed AD/Vascular dementia: MMSE 12/30 on 12/22/15, currently on Aricept. Remains pleasant and able to f/c.  Continues to participate in activities.    BP controlled with metoprolol 75 mg BID  Cerebrovascular  dz: remains on a baby aspirin for secondary prevention. No longer receives lipid monitoring due to lack of benefit/age.  Tried plavix due to recurrent TIA's but his H/H dropped so this was discontinued.    He has a hx of bipolar. Had some agitation when seroquel was reduce to 12.5 but staff report that he has not had any other issues with mood swings.  BPH: denies any issues with voiding.  Incontinent much of the time.  On Flomax 0.4 mg qd  Past Medical History:  Diagnosis Date  . Anemia, iron deficiency 12/30/2012  . Basal cell carcinoma   . Bipolar 1 disorder ([place])   . CKD (chronic kidney disease), stage II 07/09/2012   07/16/12:   stage II chronic kidney disease by GFR.  Follow lab at interval    . Dementia   . Diabetes ([place])   . GI bleed   . Hypertension   . 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 Pt. is aware of deficit. Will benefit from [place] environment for assistance, supervision, socialization    07/16/12:  Admission MDS reviewed: BIMS score 13/15,  functional status: Pt requires limited to extensive assist with all ADLs except eating (supervision)    . Pressure ulcer of sacrum 12/07/2012  . Squamous cell carcinoma of skin of scalp 01/24/14   anterior and posterior scalp   . Stroke [facility]) 08/2009   Left side with residual deficit   Past Surgical History:  Procedure Laterality Date  . HIP  ARTHROPLASTY Left 12/01/2012   Procedure: ARTHROPLASTY BIPOLAR HIP;  Surgeon: Johnn Hai, MD;  Location: [facility]S;  Service: Orthopedics;  Laterality: Left;  . SKIN LESION EXCISION  01/24/14   Anterior and posterio scalp Squamous cell [name] PA    No Known Allergies  Allergies as of 08/25/2016   No Known Allergies     Medication List       Accurate as of 08/25/16 11:59 PM. Always use your most recent med list.          acetaminophen 325 MG tablet Commonly known as:  TYLENOL Take 650 mg by mouth every 6  (six) hours as needed for pain.   ANAFRANIL 25 MG capsule Generic drug:  clomiPRAMINE Take 25 mg by mouth every morning.   antiseptic oral rinse Liqd 15 mLs by Mouth Rinse route 4 (four) times daily.   aspirin 81 MG tablet Take 81 mg by mouth daily.   CENTRUM SILVER 50+MEN Tabs Take by mouth daily.   cholecalciferol 1000 units tablet Commonly known as:  VITAMIN D Take 1,000 Units by mouth every morning.   donepezil 10 MG tablet Commonly known as:  ARICEPT Take 10 mg by mouth daily.   GLUCERNA Liqd Take 237 mLs by mouth daily.   insulin aspart 100 UNIT/ML injection Commonly known as:  novoLOG Inject into the skin 3 (three) times daily before meals. Give 5 units if CBG >200, call if >400. Must eat >50% to administer   ketoconazole 2 % shampoo Commonly known as:  NIZORAL Apply 1 application topically 2 (two) times a week.   latanoprost 0.005 % ophthalmic solution Commonly known as:  XALATAN Place 1 drop into both eyes at bedtime.   LEVEMIR FLEXTOUCH 100 UNIT/ML Pen Generic drug:  Insulin Detemir Inject 8 Units into the skin 2 (two) times daily. Hold for CBG <85   metoprolol tartrate 25 MG tablet Commonly known as:  LOPRESSOR Take 75 mg by mouth 2 (two) times daily.   polyethylene glycol packet Commonly known as:  MIRALAX / GLYCOLAX Take 17 g by mouth daily.   QUEtiapine 25 MG tablet Commonly known as:  SEROQUEL Take 12.5 mg by mouth at bedtime.   sennosides-docusate sodium 8.6-50 MG tablet Commonly known as:  SENOKOT-S Take 1 tablet by mouth daily.   tamsulosin 0.4 MG Caps capsule Commonly known as:  FLOMAX Take 0.4 mg by mouth daily after breakfast.       Review of Systems  Constitutional: Negative for chills, diaphoresis, fever, malaise/fatigue and weight loss.  Respiratory: Negative for cough, hemoptysis, sputum production and wheezing.   Cardiovascular: Positive for leg swelling. Negative for chest pain and PND.  Gastrointestinal: Negative for  abdominal pain, constipation, diarrhea, nausea and vomiting.  Genitourinary: Negative for dysuria and urgency.  Musculoskeletal: Negative for back pain, falls, joint pain, myalgias and neck pain.  Skin: Negative for itching and rash.  Neurological: Positive for focal weakness (left sided weakness from previou scva). Negative for weakness.  Psychiatric/Behavioral: Positive for memory loss. Negative for depression.     Immunization History  Administered Date(s) Administered  . Influenza Inj Mdck Quad Pf 03/17/2016  . Influenza Whole 03/13/2013  . Influenza-Unspecified 03/04/2014, 03/12/2015  . PPD Test 07/05/2012  . Pneumococcal Conjugate-13 07/01/2016  . Td 07/01/2016   Pertinent  Health Maintenance Due  Topic Date Due  . HEMOGLOBIN A1C  11/23/2016  . INFLUENZA VACCINE  12/28/2016  . URINE MICROALBUMIN  01/04/2017  . PNA vac Low Risk Adult (2 of  2 - PPSV23) 07/01/2017  . OPHTHALMOLOGY EXAM  07/06/2017  . FOOT EXAM  08/25/2017   Fall Risk  05/29/2015 03/16/2015 02/05/2015 10/20/2014  Falls in the past year? No No No No  Risk for fall due to : History of fall(s) - - -   Functional Status Survey:    Vitals:   09/07/16 1651  BP: 118/74  Pulse: 74  Resp: (!) 22  Temp: 98.6 F (37 C)  SpO2: 96%  Weight: 167 lb 6.4 oz (75.9 kg)   Body mass index is 23.35 kg/m.  Wt Readings from Last 3 Encounters:  09/07/16 167 lb 6.4 oz (75.9 kg)  06/30/16 163 lb 8 oz (74.2 kg)  05/26/16 164 lb 4.8 oz (74.5 kg)   Physical Exam  Constitutional: No distress.  HENT:  Head: Normocephalic and atraumatic.  Neck: Normal range of motion. Neck supple. No JVD present. No tracheal deviation present. No thyromegaly present.  Cardiovascular: Normal rate and regular rhythm.   No murmur heard. Pulmonary/Chest: Effort normal and breath sounds normal. No respiratory distress. He has no wheezes.  Abdominal: Soft. Bowel sounds are normal. He exhibits no distension. There is no tenderness.    Lymphadenopathy:    He has no cervical adenopathy.  Neurological: He is alert.  Left sided weakness. Able to follow commands, alert to self, place, situation (sometimes), not time  Skin: Skin is warm and dry. He is not diaphoretic.  Foot exam: BPPP+1, no ulcerations or discoloration.   Psychiatric: He has a normal mood and affect.  Nursing note and vitals reviewed.   Labs reviewed:  Recent Labs  11/05/15 01/04/16 07/01/16  NA 139 136* 144  K 4.5 4.7 4.6  BUN 28* 29* 29*  CREATININE 1.6* 1.8* 1.3   No results for input(s): AST, ALT, ALKPHOS, BILITOT, PROT, ALBUMIN in the last 8760 hours.  Recent Labs  11/05/15 01/04/16 07/01/16  WBC 8.6 8.8 8.2  HGB 9.7* 11.9* 11.3*  HCT 33* 35* 33*  PLT 168 252 168   Lab Results  Component Value Date   TSH 1.13 12/22/2015   Lab Results  Component Value Date   HGBA1C 6.8 05/25/2016   Lab Results  Component Value Date   CHOL 152 06/27/2013   HDL 55 06/27/2013   LDLCALC 78 06/27/2013   TRIG 83 06/27/2013    Significant Diagnostic Results in last 30 days:  No results found.  Assessment/Plan  1. Late effects of cerebrovascular disease Continue aspirin 81 mg qd  2. Bipolar disorder, in full remission, most recent episode mixed (HCC) Continue seroquel 12.5 mg qd and Anafranil 25 mg qd  3. Benign prostatic hyperplasia without lower urinary tract symptoms Continue Flomax 0.4 mg qd  4. Mixed Alzheimer's and vascular dementia Advanced Continue Aricept 10 mg qhs  Most likely would not benefit from Namenda at this point  5. Type 2 diabetes mellitus with stage 2 chronic kidney disease, without long-term current use of insulin (HCC) Controlled Monitor A1C q 6 months Continue Levemir 8 units BID and Novolog with meals if needed  6. Essential hypertension Controlled Continue Lopressor 75 mg BID  7. CKD (chronic kidney disease) stage 3, GFR 30-59 ml/min Unchanged Continue to monitor BMP periodically    Family/ staff  Communication: discussed with staff  Cindi Carbon, [place] [phone]

## 2016-09-20 ENCOUNTER — Encounter: Payer: Self-pay | Admitting: Internal Medicine

## 2016-09-20 ENCOUNTER — Non-Acute Institutional Stay (SKILLED_NURSING_FACILITY): Payer: Medicare Other | Admitting: Internal Medicine

## 2016-09-20 DIAGNOSIS — N183 Chronic kidney disease, stage 3 unspecified: Secondary | ICD-10-CM

## 2016-09-20 DIAGNOSIS — K5901 Slow transit constipation: Secondary | ICD-10-CM | POA: Diagnosis not present

## 2016-09-20 DIAGNOSIS — N400 Enlarged prostate without lower urinary tract symptoms: Secondary | ICD-10-CM | POA: Diagnosis not present

## 2016-09-20 DIAGNOSIS — N182 Chronic kidney disease, stage 2 (mild): Secondary | ICD-10-CM

## 2016-09-20 DIAGNOSIS — F3178 Bipolar disorder, in full remission, most recent episode mixed: Secondary | ICD-10-CM | POA: Diagnosis not present

## 2016-09-20 DIAGNOSIS — F0280 Dementia in other diseases classified elsewhere without behavioral disturbance: Secondary | ICD-10-CM

## 2016-09-20 DIAGNOSIS — E1122 Type 2 diabetes mellitus with diabetic chronic kidney disease: Secondary | ICD-10-CM

## 2016-09-20 DIAGNOSIS — G309 Alzheimer's disease, unspecified: Secondary | ICD-10-CM

## 2016-09-20 DIAGNOSIS — F0150 Vascular dementia without behavioral disturbance: Secondary | ICD-10-CM

## 2016-09-20 NOTE — Progress Notes (Signed)
Patient ID: George Kemp, male   DOB: 02/28/1927, 83 y.o.   MRN: [id number]  Location:  [place] Room Number: 283 Place of Service:  SNF (31) Provider:  Elray Dains L. Mariea Clonts, D.O., C.M.D.  Hollace Kinnier, DO  Patient Care Team: Gayland Curry, DO as PCP - General (Geriatric Medicine) [facility]  Extended Emergency Contact Information Primary Emergency Contact: [facility] Address: [address]          [place], [place] [address] [name] of [place] Phone: [phone] Mobile Phone: [phone] Relation: Daughter Secondary Emergency Contact: [facility] Address: [place]          [place], [place] [address] [name] of [place] Phone: [phone] Mobile Phone: [phone] Relation: Spouse  Code Status:  DNR Goals of care: Advanced Directive information Advanced Directives 09/20/2016  Does Patient Have a Medical Advance Directive? Yes  Type of Advance Directive Out of facility DNR (pink MOST or yellow form);[place];Living will  Does patient want to make changes to medical advance directive? -  Copy of [place] in Chart? Yes  Pre-existing out of facility DNR order (yellow form or pink MOST form) Yellow form placed in chart (order not valid for inpatient use)     Chief Complaint  Patient presents with  . Medical Management of Chronic Issues    routine visit    HPI:  Pt is a 83 y.o. male seen today for medical management of chronic diseases.    DM II: A1C  6.8 in Dec of 2017 on levemir therapy.   Eye exam on 07/06/16. CBGS typically under 200 and no lows.  Today 119 before breakfast and 172 before lunch.  Drinks glucerna well.    Mixed AD/Vascular dementia: MMSE 12/30 on 12/22/15, currently on Aricept. Remains pleasant and able to follow commands.  Continues to participate in activities.  He did have an episode last week where he pulled the fire alarm.   He was quite confused when I saw him today (was in bed taking his nap) and he was convinced I was his CNA not his doctor.  He put his arm up to pull my face toward him and wanted to hold onto my hand.  I could not get him to focus to answer my questions appropriately--typically he has in the past.  BP controlled with metoprolol 75 mg BID  Cerebrovascular dz: remains on a baby aspirin for secondary prevention. No longer receives lipid monitoring due to lack of benefit/age and not on statin.  Tried plavix due to recurrent TIA's but his H/H dropped so this was discontinued.    He has a hx of bipolar. Had some agitation when seroquel was reduce to 12.5 but staff report that he has not had any other issues with mood swings.  He is also still on anafranil antidepressant TCA not recommended in his age group due to potential effects on cognition.  I've been hesitant to taper it off due to stability, but staff are resistant to stopping seroquel due to a bad day after it was reduced so I'll start with the anafranil.     BPH: denies any issues with voiding.  Incontinent much of the time.  On Flomax 0.4 mg qd.  No difficulty with output.  Past Medical History:  Diagnosis Date  . Anemia, iron deficiency 12/30/2012  . Basal cell carcinoma   . Bipolar 1 disorder ([place])   . CKD (chronic kidney disease),  stage II 07/09/2012   07/16/12:   stage II chronic kidney disease by GFR.  Follow lab at interval    . Dementia   . Diabetes ([place])   . GI bleed   . Hypertension   . 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 Pt. is aware of deficit. Will benefit from [place] environment for assistance, supervision, socialization    07/16/12:  Admission MDS reviewed: BIMS score 13/15,  functional status: Pt requires limited to extensive assist with all ADLs except eating (supervision)    . Pressure ulcer of sacrum 12/07/2012  . Squamous cell carcinoma of  skin of scalp 01/24/14   anterior and posterior scalp   . Stroke [facility]) 08/2009   Left side with residual deficit   Past Surgical History:  Procedure Laterality Date  . HIP ARTHROPLASTY Left 12/01/2012   Procedure: ARTHROPLASTY BIPOLAR HIP;  Surgeon: Johnn Hai, MD;  Location: [facility];  Service: Orthopedics;  Laterality: Left;  . SKIN LESION EXCISION  01/24/14   Anterior and posterio scalp Squamous cell [name] PA    No Known Allergies  Outpatient Encounter Prescriptions as of 09/20/2016  Medication Sig  . acetaminophen (TYLENOL) 325 MG tablet Take 650 mg by mouth every 6 (six) hours as needed for pain.  Marland Kitchen antiseptic oral rinse (BIOTENE) LIQD 15 mLs by Mouth Rinse route 4 (four) times daily.  Marland Kitchen aspirin 81 MG tablet Take 81 mg by mouth daily.  . cholecalciferol (VITAMIN D) 1000 UNITS tablet Take 1,000 Units by mouth every morning.  . clomiPRAMINE (ANAFRANIL) 25 MG capsule Take 25 mg by mouth every morning.  . donepezil (ARICEPT) 10 MG tablet Take 10 mg by mouth daily.  Marland Kitchen GLUCERNA (GLUCERNA) LIQD Take 237 mLs by mouth daily.   . insulin aspart (NOVOLOG) 100 UNIT/ML injection Inject into the skin 3 (three) times daily before meals. Give 5 units if CBG >200, call if >400. Must eat >50% to administer  . Insulin Detemir (LEVEMIR FLEXTOUCH) 100 UNIT/ML Pen Inject 8 Units into the skin 2 (two) times daily. Hold for CBG <85   . ketoconazole (NIZORAL) 2 % shampoo Apply 1 application topically 2 (two) times a week.  . latanoprost (XALATAN) 0.005 % ophthalmic solution Place 1 drop into both eyes at bedtime.  . metoprolol tartrate (LOPRESSOR) 25 MG tablet Take 75 mg by mouth 2 (two) times daily.   . Multiple Vitamins-Minerals (CENTRUM SILVER 50+MEN) TABS Take by mouth daily.  . polyethylene glycol (MIRALAX / GLYCOLAX) packet Take 17 g by mouth daily.  . QUEtiapine (SEROQUEL) 25 MG tablet Take 12.5 mg by mouth at bedtime.   . sennosides-docusate sodium (SENOKOT-S) 8.6-50 MG tablet Take 1 tablet  by mouth daily.  . tamsulosin (FLOMAX) 0.4 MG CAPS Take 0.4 mg by mouth daily after breakfast.   No facility-administered encounter medications on file as of 09/20/2016.     Review of Systems  Constitutional: Negative for activity change, appetite change, fatigue, fever and unexpected weight change.  HENT: Positive for hearing loss. Negative for congestion.   Eyes: Negative for visual disturbance.       Glasses  Respiratory: Negative for chest tightness and shortness of breath.   Cardiovascular: Negative for chest pain and leg swelling.  Gastrointestinal: Negative for abdominal pain and constipation.  Genitourinary: Negative for dysuria.       BPH  Musculoskeletal: Positive for gait problem. Negative for arthralgias.  Neurological: Positive for speech difficulty and weakness.  Negative for dizziness.       Right hemiparesis  Hematological: Bruises/bleeds easily.  Psychiatric/Behavioral: Positive for agitation and confusion. Negative for hallucinations and sleep disturbance. The patient is not nervous/anxious.        Dementia, bipolar    Immunization History  Administered Date(s) Administered  . Influenza Inj Mdck Quad Pf 03/17/2016  . Influenza Whole 03/13/2013  . Influenza-Unspecified 03/04/2014, 03/12/2015  . PPD Test 07/05/2012  . Pneumococcal Conjugate-13 07/01/2016  . Td 07/01/2016   Pertinent  Health Maintenance Due  Topic Date Due  . HEMOGLOBIN A1C  11/23/2016  . INFLUENZA VACCINE  12/28/2016  . URINE MICROALBUMIN  01/04/2017  . PNA vac Low Risk Adult (2 of 2 - PPSV23) 07/01/2017  . OPHTHALMOLOGY EXAM  07/06/2017  . FOOT EXAM  08/25/2017   Fall Risk  05/29/2015 03/16/2015 02/05/2015 10/20/2014  Falls in the past year? No No No No  Risk for fall due to : History of fall(s) - - -   Functional Status Survey:    Vitals:   09/20/16 1244  BP: 130/65  Pulse: 63  Resp: 20  Temp: 97.9 F (36.6 C)  TempSrc: Oral  SpO2: 97%  Weight: 159 lb (72.1 kg)   Body mass  index is 22.18 kg/m. Physical Exam  Constitutional: He appears well-developed and well-nourished. No distress.  Cardiovascular: Normal rate, regular rhythm, normal heart sounds and intact distal pulses.   Pulmonary/Chest: Effort normal and breath sounds normal. No respiratory distress.  Abdominal: Soft. Bowel sounds are normal. He exhibits no distension. There is no tenderness.  Neurological: He is alert.  Seems more confused than usual today, but could be b/c I woke him from his nap; right hemiparesis and dysarthria  Skin: Skin is warm and dry.  Psychiatric: He has a normal mood and affect.    Labs reviewed:  Recent Labs  11/05/15 01/04/16 07/01/16  NA 139 136* 144  K 4.5 4.7 4.6  BUN 28* 29* 29*  CREATININE 1.6* 1.8* 1.3   No results for input(s): AST, ALT, ALKPHOS, BILITOT, PROT, ALBUMIN in the last 8760 hours.  Recent Labs  11/05/15 01/04/16 07/01/16  WBC 8.6 8.8 8.2  HGB 9.7* 11.9* 11.3*  HCT 33* 35* 33*  PLT 168 252 168   Lab Results  Component Value Date   TSH 1.13 12/22/2015   Lab Results  Component Value Date   HGBA1C 6.8 05/25/2016   Lab Results  Component Value Date   CHOL 152 06/27/2013   HDL 55 06/27/2013   LDLCALC 78 06/27/2013   TRIG 83 06/27/2013    Assessment/Plan 1. Bipolar disorder, in full remission, most recent episode mixed ([place]) -will attempt to get him off anafranil TCA  -if he has difficulty with the change, might try him on ssri instead like celexa or use remeron -would avoid more stimulatory ssris like zoloft or snris for fear of mania ensuing -cont seroquel low dose due to recent pulling of fire alarm and staff concerns after last dose reduction attempt that he will become very agitated and not calm down  2. Mixed Alzheimer's and vascular dementia -continues on aricept--if all days are like today, would taper him off, but this seemed surprising to staff that see more of him so continue at this point  3. Benign prostatic hyperplasia  without lower urinary tract symptoms -cont flomax, stable, is incontinent  4. Type 2 diabetes mellitus with stage 2 chronic kidney disease, without long-term current use of insulin ([place]) -cont levemir, unfortunately, attempts  with orals were not controlling glucose well enough though goal is <8 for his age and complex of illnesses  5. CKD (chronic kidney disease) stage 3, GFR 30-59 ml/min -cont to monitor, avoid nephrotoxic agents, maintain hydration, dose adjust renally excreted meds  6. Slow transit constipation -cont current regimen which has been effective  Family/ staff Communication: discussed with DON, snf nurse  Labs/tests ordered:  No new  Briggett Tuccillo L. Slyvia Lartigue, D.O. [facility] [address], [place] [address] Cell Phone (Mon-Fri 8am-5pm):  [phone] On Call:  [phone] & follow prompts after 5pm & weekends Office Phone:  [phone] Office Fax:  [phone]

## 2016-09-28 ENCOUNTER — Non-Acute Institutional Stay (SKILLED_NURSING_FACILITY): Payer: Medicare Other

## 2016-09-28 DIAGNOSIS — Z0000 Encounter for general adult medical examination without abnormal findings: Secondary | ICD-10-CM | POA: Diagnosis not present

## 2016-09-28 NOTE — Patient Instructions (Signed)
George Kemp , Thank you for taking time to come for your Medicare Wellness Visit. I appreciate your ongoing commitment to your health goals. Please review the following plan we discussed and let me know if I can assist you in the future.   Screening recommendations/referrals: Colonoscopy up to date Recommended yearly ophthalmology/optometry visit for glaucoma screening and checkup Recommended yearly dental visit for hygiene and checkup  Vaccinations: Influenza vaccine up to date Pneumococcal vaccine- second vaccine due 07/01/2017 Tdap vaccine up to date. Due 07/01/2026 Shingles vaccine due. If you decide you want this we will put order in.  Advanced directives: In chart  Conditions/risks identified: none  Next appointment: none upcoming  Preventive Care 22 Years and Older, Male Preventive care refers to lifestyle choices and visits with your health care provider that can promote health and wellness. What does preventive care include?  A yearly physical exam. This is also called an annual well check.  Dental exams once or twice a year.  Routine eye exams. Ask your health care provider how often you should have your eyes checked.  Personal lifestyle choices, including:  Daily care of your teeth and gums.  Regular physical activity.  Eating a healthy diet.  Avoiding tobacco and drug use.  Limiting alcohol use.  Practicing safe sex.  Taking low doses of aspirin every day.  Taking vitamin and mineral supplements as recommended by your health care provider. What happens during an annual well check? The services and screenings done by your health care provider during your annual well check will depend on your age, overall health, lifestyle risk factors, and family history of disease. Counseling  Your health care provider may ask you questions about your:  Alcohol use.  Tobacco use.  Drug use.  Emotional well-being.  Home and relationship well-being.  Sexual  activity.  Eating habits.  History of falls.  Memory and ability to understand (cognition).  Work and work Statistician. Screening  You may have the following tests or measurements:  Height, weight, and BMI.  Blood pressure.  Lipid and cholesterol levels. These may be checked every 5 years, or more frequently if you are over 81 years old.  Skin check.  Lung cancer screening. You may have this screening every year starting at age 70 if you have a 30-pack-year history of smoking and currently smoke or have quit within the past 15 years.  Fecal occult blood test (FOBT) of the stool. You may have this test every year starting at age 35.  Flexible sigmoidoscopy or colonoscopy. You may have a sigmoidoscopy every 5 years or a colonoscopy every 10 years starting at age 94.  Prostate cancer screening. Recommendations will vary depending on your family history and other risks.  Hepatitis C blood test.  Hepatitis B blood test.  Sexually transmitted disease (STD) testing.  Diabetes screening. This is done by checking your blood sugar (glucose) after you have not eaten for a while (fasting). You may have this done every 1-3 years.  Abdominal aortic aneurysm (AAA) screening. You may need this if you are a current or former smoker.  Osteoporosis. You may be screened starting at age 68 if you are at high risk. Talk with your health care provider about your test results, treatment options, and if necessary, the need for more tests. Vaccines  Your health care provider may recommend certain vaccines, such as:  Influenza vaccine. This is recommended every year.  Tetanus, diphtheria, and acellular pertussis (Tdap, Td) vaccine. You may need a Td  booster every 10 years.  Zoster vaccine. You may need this after age 55.  Pneumococcal 13-valent conjugate (PCV13) vaccine. One dose is recommended after age 47.  Pneumococcal polysaccharide (PPSV23) vaccine. One dose is recommended after age  39. Talk to your health care provider about which screenings and vaccines you need and how often you need them. This information is not intended to replace advice given to you by your health care provider. Make sure you discuss any questions you have with your health care provider. Document Released: 06/12/2015 Document Revised: 02/03/2016 Document Reviewed: 03/17/2015 Elsevier Interactive Patient Education  2017 [employer] Prevention in the Home Falls can cause injuries. They can happen to people of all ages. There are many things you can do to make your home safe and to help prevent falls. What can I do on the outside of my home?  Regularly fix the edges of walkways and driveways and fix any cracks.  Remove anything that might make you trip as you walk through a door, such as a raised step or threshold.  Trim any bushes or trees on the path to your home.  Use bright outdoor lighting.  Clear any walking paths of anything that might make someone trip, such as rocks or tools.  Regularly check to see if handrails are loose or broken. Make sure that both sides of any steps have handrails.  Any raised decks and porches should have guardrails on the edges.  Have any leaves, snow, or ice cleared regularly.  Use sand or salt on walking paths during winter.  Clean up any spills in your garage right away. This includes oil or grease spills. What can I do in the bathroom?  Use night lights.  Install grab bars by the toilet and in the tub and shower. Do not use towel bars as grab bars.  Use non-skid mats or decals in the tub or shower.  If you need to sit down in the shower, use a plastic, non-slip stool.  Keep the floor dry. Clean up any water that spills on the floor as soon as it happens.  Remove soap buildup in the tub or shower regularly.  Attach bath mats securely with double-sided non-slip rug tape.  Do not have throw rugs and other things on the floor that can make  you trip. What can I do in the bedroom?  Use night lights.  Make sure that you have a light by your bed that is easy to reach.  Do not use any sheets or blankets that are too big for your bed. They should not hang down onto the floor.  Have a firm chair that has side arms. You can use this for support while you get dressed.  Do not have throw rugs and other things on the floor that can make you trip. What can I do in the kitchen?  Clean up any spills right away.  Avoid walking on wet floors.  Keep items that you use a lot in easy-to-reach places.  If you need to reach something above you, use a strong step stool that has a grab bar.  Keep electrical cords out of the way.  Do not use floor polish or wax that makes floors slippery. If you must use wax, use non-skid floor wax.  Do not have throw rugs and other things on the floor that can make you trip. What can I do with my stairs?  Do not leave any items on the stairs.  Make sure that there are handrails on both sides of the stairs and use them. Fix handrails that are broken or loose. Make sure that handrails are as long as the stairways.  Check any carpeting to make sure that it is firmly attached to the stairs. Fix any carpet that is loose or worn.  Avoid having throw rugs at the top or bottom of the stairs. If you do have throw rugs, attach them to the floor with carpet tape.  Make sure that you have a light switch at the top of the stairs and the bottom of the stairs. If you do not have them, ask someone to add them for you. What else can I do to help prevent falls?  Wear shoes that:  Do not have high heels.  Have rubber bottoms.  Are comfortable and fit you well.  Are closed at the toe. Do not wear sandals.  If you use a stepladder:  Make sure that it is fully opened. Do not climb a closed stepladder.  Make sure that both sides of the stepladder are locked into place.  Ask someone to hold it for you, if  possible.  Clearly mark and make sure that you can see:  Any grab bars or handrails.  First and last steps.  Where the edge of each step is.  Use tools that help you move around (mobility aids) if they are needed. These include:  Canes.  Walkers.  Scooters.  Crutches.  Turn on the lights when you go into a dark area. Replace any light bulbs as soon as they burn out.  Set up your furniture so you have a clear path. Avoid moving your furniture around.  If any of your floors are uneven, fix them.  If there are any pets around you, be aware of where they are.  Review your medicines with your doctor. Some medicines can make you feel dizzy. This can increase your chance of falling. Ask your doctor what other things that you can do to help prevent falls. This information is not intended to replace advice given to you by your health care provider. Make sure you discuss any questions you have with your health care provider. Document Released: 03/12/2009 Document Revised: 10/22/2015 Document Reviewed: 06/20/2014 Elsevier Interactive Patient Education  2017 [employer].

## 2016-09-28 NOTE — Progress Notes (Signed)
Subjective:   George Kemp is a 83 y.o. male who presents for Medicare Annual/Subsequent preventive examination at [facility]   Cardiac Risk Factors include: advanced age (>26men, >34 women);hypertension;family history of premature cardiovascular disease;smoking/ tobacco exposure;sedentary lifestyle     Objective:    Vitals: BP 130/70 (BP Location: Right Arm, Patient Position: Sitting)   Pulse 98   Temp 98 F (36.7 C) (Oral)   Ht 5\' 11"  (1.803 m)   Wt 159 lb 9.6 oz (72.4 kg)   SpO2 99%   BMI 22.26 kg/m   Body mass index is 22.26 kg/m.  Tobacco History  Smoking Status  . Former Smoker  . Packs/day: 1.00  Smokeless Tobacco  . Never Used     Counseling given: Not Answered   Past Medical History:  Diagnosis Date  . Anemia, iron deficiency 12/30/2012  . Basal cell carcinoma   . Bipolar 1 disorder ([place])   . CKD (chronic kidney disease), stage II 07/09/2012   07/16/12:   stage II chronic kidney disease by GFR.  Follow lab at interval    . Dementia   . Diabetes ([place])   . GI bleed   . Hypertension   . 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 Pt. is aware of deficit. Will benefit from [place] environment for assistance, supervision, socialization    07/16/12:  Admission MDS reviewed: BIMS score 13/15,  functional status: Pt requires limited to extensive assist with all ADLs except eating (supervision)    . Pressure ulcer of sacrum 12/07/2012  . Squamous cell carcinoma of skin of scalp 01/24/14   anterior and posterior scalp   . Stroke [employer]) 08/2009   Left side with residual deficit   Past Surgical History:  Procedure Laterality Date  . HIP ARTHROPLASTY Left 12/01/2012   Procedure: ARTHROPLASTY BIPOLAR HIP;  Surgeon: Johnn Hai, MD;  Location: [facility];  Service: Orthopedics;  Laterality: Left;  . SKIN LESION EXCISION  01/24/14   Anterior and posterio scalp Squamous cell [name]  PA   Family History  Problem Relation Age of Onset  . Stroke Mother   . Congestive Heart Failure Mother   . Cancer - Other Father     Unknown  Type  . Cancer Father   . Stroke Brother      X 2  . Diabetes Brother   . Thyroid disease Daughter    History  Sexual Activity  . Sexual activity: No    Outpatient Encounter Prescriptions as of 09/28/2016  Medication Sig  . acetaminophen (TYLENOL) 325 MG tablet Take 650 mg by mouth every 6 (six) hours as needed for pain.  Marland Kitchen antiseptic oral rinse (BIOTENE) LIQD 15 mLs by Mouth Rinse route 4 (four) times daily.  Marland Kitchen aspirin 81 MG tablet Take 81 mg by mouth daily.  . cholecalciferol (VITAMIN D) 1000 UNITS tablet Take 1,000 Units by mouth every morning.  . donepezil (ARICEPT) 10 MG tablet Take 10 mg by mouth daily.  Marland Kitchen GLUCERNA (GLUCERNA) LIQD Take 237 mLs by mouth daily.   . insulin aspart (NOVOLOG) 100 UNIT/ML injection Inject into the skin 3 (three) times daily before meals. Give 5 units if CBG >200, call if >400. Must eat >50% to administer  . Insulin Detemir (LEVEMIR FLEXTOUCH) 100 UNIT/ML Pen Inject 8 Units into the skin 2 (two) times daily. Hold for CBG <85   . ketoconazole (NIZORAL) 2 % shampoo Apply 1 application topically  2 (two) times a week.  . latanoprost (XALATAN) 0.005 % ophthalmic solution Place 1 drop into both eyes at bedtime.  . metoprolol tartrate (LOPRESSOR) 25 MG tablet Take 75 mg by mouth 2 (two) times daily.   . Multiple Vitamins-Minerals (CENTRUM SILVER 50+MEN) TABS Take by mouth daily.  . polyethylene glycol (MIRALAX / GLYCOLAX) packet Take 17 g by mouth daily.  . QUEtiapine (SEROQUEL) 25 MG tablet Take 12.5 mg by mouth at bedtime.   . sennosides-docusate sodium (SENOKOT-S) 8.6-50 MG tablet Take 1 tablet by mouth daily.  . tamsulosin (FLOMAX) 0.4 MG CAPS Take 0.4 mg by mouth daily after breakfast.  . [DISCONTINUED] clomiPRAMINE (ANAFRANIL) 25 MG capsule Take 25 mg by mouth every morning.   No facility-administered  encounter medications on file as of 09/28/2016.     Activities of Daily Living In your present state of health, do you have any difficulty performing the following activities: 09/28/2016 11/08/2015  Hearing? Tempie Donning  Vision? N N  Difficulty concentrating or making decisions? N Y  Walking or climbing stairs? Y Y  Dressing or bathing? Y Y  Doing errands, shopping? Tempie Donning  Preparing Food and eating ? Y Y  Using the Toilet? Y Y  In the past six months, have you accidently leaked urine? N Y  Do you have problems with loss of bowel control? N Y  Managing your Medications? Y Y  Managing your Finances? Tempie Donning  Housekeeping or managing your Housekeeping? Tempie Donning  Some recent data might be hidden    Patient Care Team: Gayland Curry, DO as PCP - General (Geriatric Medicine) [facility]   Assessment:     Exercise Activities and Dietary recommendations Current Exercise Habits: The patient does not participate in regular exercise at present, Exercise limited by: None identified  Goals    . Maintain Lifestyle          Starting 09/28/2016 pt will maintain his lifestyle      Fall Risk Fall Risk  09/28/2016 05/29/2015 03/16/2015 02/05/2015 10/20/2014  Falls in the past year? No No No No No  Risk for fall due to : - History of fall(s) - - -   Depression Screen PHQ 2/9 Scores 09/28/2016 05/29/2015 03/16/2015 02/05/2015  PHQ - 2 Score 0 0 0 0    Cognitive Function MMSE - Mini Mental State Exam 08/22/2016  Orientation to time 0  Orientation to Place 3  Registration 3  Attention/ Calculation 2  Recall 0  Language- name 2 objects 2  Language- repeat 1  Language- follow 3 step command 3  Language- read & follow direction 1  Write a sentence 1  Copy design 0  Total score 16        Immunization History  Administered Date(s) Administered  . Influenza Inj Mdck Quad Pf 03/17/2016  . Influenza Whole 03/13/2013  . Influenza-Unspecified 03/04/2014, 03/12/2015  . PPD Test 07/05/2012  .  Pneumococcal Conjugate-13 07/01/2016  . Td 07/01/2016   Screening Tests Health Maintenance  Topic Date Due  . HEMOGLOBIN A1C  11/23/2016  . INFLUENZA VACCINE  12/28/2016  . URINE MICROALBUMIN  01/04/2017  . PNA vac Low Risk Adult (2 of 2 - PPSV23) 07/01/2017  . OPHTHALMOLOGY EXAM  07/06/2017  . FOOT EXAM  08/25/2017  . TETANUS/TDAP  07/01/2026      Plan:    I have personally reviewed and addressed the Medicare Annual Wellness questionnaire and have noted the following in the patient's chart:  A.  Medical and social history B. Use of alcohol, tobacco or illicit drugs  C. Current medications and supplements D. Functional ability and status E.  Nutritional status F.  Physical activity G. Advance directives H. List of other physicians I.  Hospitalizations, surgeries, and ER visits in previous 12 months J.  [place] to include hearing, vision, cognitive, depression L. Referrals and appointments - none  In addition, I have reviewed and discussed with patient certain preventive protocols, quality metrics, and best practice recommendations. A written personalized care plan for preventive services as well as general preventive health recommendations were provided to patient.  See attached scanned questionnaire for additional information.   Signed,   Rich Reining, RN Nurse Health Advisor

## 2016-09-28 NOTE — Progress Notes (Signed)
   I reviewed health advisor's note, was available for consultation and agree with the assessment and plan as written.    Kristofer Schaffert L. Mychal [name], D.O. [facility] [address],  [address] Cell Phone (Mon-Fri 8am-5pm):  [phone] On Call:  [phone] & follow prompts after 5pm & weekends Office Phone:  [phone] Office Fax:  [phone]    Quick Notes   Health Maintenance: None  Abnormal Screen: MMSE 16/30 on 08/22/2016. Did not pass clock drawing  Patient Concerns: None   Nurse Concerns: None

## 2016-10-17 ENCOUNTER — Encounter: Payer: Self-pay | Admitting: Adult Health

## 2016-10-17 ENCOUNTER — Non-Acute Institutional Stay (SKILLED_NURSING_FACILITY): Payer: Medicare Other | Admitting: Adult Health

## 2016-10-17 DIAGNOSIS — G309 Alzheimer's disease, unspecified: Secondary | ICD-10-CM | POA: Diagnosis not present

## 2016-10-17 DIAGNOSIS — F0280 Dementia in other diseases classified elsewhere without behavioral disturbance: Secondary | ICD-10-CM | POA: Diagnosis not present

## 2016-10-17 DIAGNOSIS — F3178 Bipolar disorder, in full remission, most recent episode mixed: Secondary | ICD-10-CM

## 2016-10-17 DIAGNOSIS — I10 Essential (primary) hypertension: Secondary | ICD-10-CM | POA: Diagnosis not present

## 2016-10-17 DIAGNOSIS — N182 Chronic kidney disease, stage 2 (mild): Secondary | ICD-10-CM | POA: Diagnosis not present

## 2016-10-17 DIAGNOSIS — D638 Anemia in other chronic diseases classified elsewhere: Secondary | ICD-10-CM

## 2016-10-17 DIAGNOSIS — E559 Vitamin D deficiency, unspecified: Secondary | ICD-10-CM

## 2016-10-17 DIAGNOSIS — E1122 Type 2 diabetes mellitus with diabetic chronic kidney disease: Secondary | ICD-10-CM

## 2016-10-17 DIAGNOSIS — K5901 Slow transit constipation: Secondary | ICD-10-CM

## 2016-10-17 DIAGNOSIS — N183 Chronic kidney disease, stage 3 unspecified: Secondary | ICD-10-CM

## 2016-10-17 DIAGNOSIS — F0150 Vascular dementia without behavioral disturbance: Secondary | ICD-10-CM | POA: Diagnosis not present

## 2016-10-18 NOTE — Progress Notes (Signed)
Location:   Customer service manager of Service:  SNF (31) Provider:   Cindi Carbon, ANP [place] [phone]   Gayland Curry, DO  Patient Care Team: Gayland Curry, DO as PCP - General (Geriatric Medicine) Fay Records, Well [name]  Extended Emergency Contact Information Primary Emergency Contact: [facility] Address: [address]          [place], [place] [address] [name] of [place] Phone: [phone] Mobile Phone: [phone] Relation: Daughter  Code Status: DNR Goals of care: Advanced Directive information Advanced Directives 10/17/2016  Does Patient Have a Medical Advance Directive? Yes  Type of Paramedic of [place];Living will;Out of facility DNR (pink MOST or yellow form)  Does patient want to make changes to medical advance directive? -  Copy of [place] in Chart? Yes  Pre-existing out of facility DNR order (yellow form or pink MOST form) Yellow form placed in chart (order not valid for inpatient use)     Chief Complaint  Patient presents with  . Medical Management of Chronic Issues    HPI:  83 y.o.male  residing at [facility], skilled care.  I am here to review his chronic medical issues.  VS have been stable over the past month. His weight went up to 169, a 10 lb gain but we are not sure of the accuracy of this as he does not look to have gained weight. He will be reweighed.   DM II: A1C 8% in august, placed on levemir. A1C improved to 6.8 in Dec of 2017 (insurance would not cover lantus or toujeo).   Eye exam on 07/06/16. CBGS am 110-149 supper 195-244  Mixed AD/Vascular dementia: MMSE 12/30 on 12/22/15, currently on Aricept. Remains pleasant and able to f/c.  Continues to participate in activities.    BP controlled with metoprolol 75 mg BID  He has a hx of bipolar. Off Anafranil with no issues with mood/swings. Remains on low dose seroquel  Constipation:  reports regular BMS on miralax  And senokot s 1 tab qd  ACD:  Lab Results  Component Value Date   WBC 8.2 07/01/2016   HGB 11.3 (A) 07/01/2016   HCT 33 (A) 07/01/2016   MCV 95.1 12/06/2012   PLT 168 07/01/2016    Past Medical History:  Diagnosis Date  . Anemia, iron deficiency 12/30/2012  . Basal cell carcinoma   . Bipolar 1 disorder ([place])   . CKD (chronic kidney disease), stage II 07/09/2012   07/16/12:   stage II chronic kidney disease by GFR.  Follow lab at interval    . Dementia   . Diabetes ([place])   . GI bleed   . Hypertension   . 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 Pt. is aware of deficit. Will benefit from [place] environment for assistance, supervision, socialization    07/16/12:  Admission MDS reviewed: BIMS score 13/15,  functional status: Pt requires limited to extensive assist with all ADLs except eating (supervision)    . Pressure ulcer of sacrum 12/07/2012  . Squamous cell carcinoma of skin of scalp 01/24/14   anterior and posterior scalp   . Stroke [facility]) 08/2009   Left side with residual deficit   Past Surgical History:  Procedure Laterality Date  . HIP ARTHROPLASTY Left 12/01/2012   Procedure: ARTHROPLASTY BIPOLAR HIP;  Surgeon: Johnn Hai, MD;  Location: [facility] ORS;  Service: Orthopedics;  Laterality: Left;  . SKIN LESION EXCISION  01/24/14   Anterior and posterio scalp Squamous cell [name] PA    No Known Allergies  Allergies as of 10/17/2016   No Known Allergies     Medication List       Accurate as of 10/17/16 11:59 PM. Always use your most recent med list.          acetaminophen 325 MG tablet Commonly known as:  TYLENOL Take 650 mg by mouth every 6 (six) hours as needed for pain.   antiseptic oral rinse Liqd 15 mLs by Mouth Rinse route 4 (four) times daily.   aspirin 81 MG tablet Take 81 mg by mouth daily.   CENTRUM SILVER 50+MEN Tabs Take by mouth daily.     cholecalciferol 1000 units tablet Commonly known as:  VITAMIN D Take 1,000 Units by mouth every morning.   donepezil 10 MG tablet Commonly known as:  ARICEPT Take 10 mg by mouth daily.   GLUCERNA Liqd Take 237 mLs by mouth daily.   insulin aspart 100 UNIT/ML injection Commonly known as:  novoLOG Inject into the skin 3 (three) times daily before meals. Give 5 units if CBG >200, call if >400. Must eat >50% to administer   ketoconazole 2 % shampoo Commonly known as:  NIZORAL Apply 1 application topically 2 (two) times a week.   latanoprost 0.005 % ophthalmic solution Commonly known as:  XALATAN Place 1 drop into both eyes at bedtime.   LEVEMIR FLEXTOUCH 100 UNIT/ML Pen Generic drug:  Insulin Detemir Inject 8 Units into the skin 2 (two) times daily. Hold for CBG <85   metoprolol tartrate 25 MG tablet Commonly known as:  LOPRESSOR Take 75 mg by mouth 2 (two) times daily.   polyethylene glycol packet Commonly known as:  MIRALAX / GLYCOLAX Take 17 g by mouth daily.   QUEtiapine 25 MG tablet Commonly known as:  SEROQUEL Take 12.5 mg by mouth at bedtime.   sennosides-docusate sodium 8.6-50 MG tablet Commonly known as:  SENOKOT-S Take 1 tablet by mouth daily.   tamsulosin 0.4 MG Caps capsule Commonly known as:  FLOMAX Take 0.4 mg by mouth daily after breakfast.       Review of Systems  Constitutional: Negative for chills, diaphoresis, fever, malaise/fatigue and weight loss.  Respiratory: Negative for cough, hemoptysis, sputum production and wheezing.   Cardiovascular: Positive for leg swelling. Negative for chest pain and PND.  Gastrointestinal: Negative for abdominal pain, constipation, diarrhea, nausea and vomiting.  Genitourinary: Negative for dysuria and urgency.  Musculoskeletal: Negative for back pain, falls, joint pain, myalgias and neck pain.  Skin: Negative for itching and rash.  Neurological: Positive for focal weakness (left sided weakness from previou  scva). Negative for weakness.  Psychiatric/Behavioral: Positive for memory loss. Negative for depression.     Immunization History  Administered Date(s) Administered  . Influenza Inj Mdck Quad Pf 03/17/2016  . Influenza Whole 03/13/2013  . Influenza-Unspecified 03/04/2014, 03/12/2015  . PPD Test 07/05/2012  . Pneumococcal Conjugate-13 07/01/2016  . Td 07/01/2016   Pertinent  Health Maintenance Due  Topic Date Due  . HEMOGLOBIN A1C  11/23/2016  . INFLUENZA VACCINE  12/28/2016  . URINE MICROALBUMIN  01/04/2017  . PNA vac Low Risk Adult (2 of 2 - PPSV23) 07/01/2017  . OPHTHALMOLOGY EXAM  07/06/2017  . FOOT EXAM  08/25/2017   Fall Risk  09/28/2016 05/29/2015 03/16/2015 02/05/2015 10/20/2014  Falls in the past year? No No No No No  Risk  for fall due to : - History of fall(s) - - -   Functional Status Survey:    Vitals:   10/17/16 1431  BP: 117/73  Pulse: 75  Resp: 18  Temp: 98.6 F (37 C)  Weight: 169 lb (76.7 kg)   Body mass index is 23.57 kg/m.  Wt Readings from Last 3 Encounters:  10/17/16 169 lb (76.7 kg)  09/28/16 159 lb 9.6 oz (72.4 kg)  09/20/16 159 lb (72.1 kg)   Physical Exam  Constitutional: No distress.  HENT:  Head: Normocephalic and atraumatic.  Neck: Normal range of motion. Neck supple. No JVD present. No tracheal deviation present. No thyromegaly present.  Cardiovascular: Normal rate and regular rhythm.   No murmur heard. Pulmonary/Chest: Effort normal and breath sounds normal. No respiratory distress. He has no wheezes.  Abdominal: Soft. Bowel sounds are normal. He exhibits no distension. There is no tenderness.  Lymphadenopathy:    He has no cervical adenopathy.  Neurological: He is alert.  Left sided weakness. Able to follow commands, alert to self, place, situation (sometimes), not time  Skin: Skin is warm and dry. He is not diaphoretic.     Psychiatric: He has a normal mood and affect.  Nursing note and vitals reviewed.   Labs  reviewed:  Recent Labs  11/05/15 01/04/16 07/01/16  NA 139 136* 144  K 4.5 4.7 4.6  BUN 28* 29* 29*  CREATININE 1.6* 1.8* 1.3   No results for input(s): AST, ALT, ALKPHOS, BILITOT, PROT, ALBUMIN in the last 8760 hours.  Recent Labs  11/05/15 01/04/16 07/01/16  WBC 8.6 8.8 8.2  HGB 9.7* 11.9* 11.3*  HCT 33* 35* 33*  PLT 168 252 168   Lab Results  Component Value Date   TSH 1.13 12/22/2015   Lab Results  Component Value Date   HGBA1C 6.8 05/25/2016   Lab Results  Component Value Date   CHOL 152 06/27/2013   HDL 55 06/27/2013   LDLCALC 78 06/27/2013   TRIG 83 06/27/2013    Significant Diagnostic Results in last 30 days:  No results found.  Assessment/Plan  1. Essential hypertension Controlled Continue current medication  2. Slow transit constipation Controlled Continue miralax and senokot  3. Type 2 diabetes mellitus with stage 2 chronic kidney disease, without long-term current use of insulin (HCC) Controlled A1C due in June Continue Levemir 8 units SQ BID  4. CKD (chronic kidney disease) stage 3, GFR 30-59 ml/min Monitor BMP periodically, avoid nephrotoxic agents  5. Bipolar disorder, in full remission, most recent episode mixed (HCC) Stable mood off Anafranil Consider taper of seroquel in the next few months  6. Mixed Alzheimer's and vascular dementia Progressive decline in cognition but remains able to participate in activities Continue Aricept  7. Vitamin D deficiency Continue Vit d supplementation 1000 units per day  8. Anemia of chronic disease Unchanged H/H Due to DM and CKD Continue to monitor CBC periodically   Family/ staff Communication: discussed with staff  Cindi Carbon, [place] [phone]

## 2016-11-17 ENCOUNTER — Encounter: Payer: Self-pay | Admitting: Adult Health

## 2016-11-17 ENCOUNTER — Non-Acute Institutional Stay (SKILLED_NURSING_FACILITY): Payer: Medicare Other | Admitting: Adult Health

## 2016-11-17 DIAGNOSIS — I6990 Unspecified sequelae of unspecified cerebrovascular disease: Secondary | ICD-10-CM | POA: Diagnosis not present

## 2016-11-17 DIAGNOSIS — N183 Chronic kidney disease, stage 3 unspecified: Secondary | ICD-10-CM

## 2016-11-17 DIAGNOSIS — F0150 Vascular dementia without behavioral disturbance: Secondary | ICD-10-CM | POA: Diagnosis not present

## 2016-11-17 DIAGNOSIS — N400 Enlarged prostate without lower urinary tract symptoms: Secondary | ICD-10-CM | POA: Diagnosis not present

## 2016-11-17 DIAGNOSIS — Z794 Long term (current) use of insulin: Secondary | ICD-10-CM

## 2016-11-17 DIAGNOSIS — F0280 Dementia in other diseases classified elsewhere without behavioral disturbance: Secondary | ICD-10-CM

## 2016-11-17 DIAGNOSIS — G309 Alzheimer's disease, unspecified: Secondary | ICD-10-CM | POA: Diagnosis not present

## 2016-11-17 DIAGNOSIS — F3178 Bipolar disorder, in full remission, most recent episode mixed: Secondary | ICD-10-CM

## 2016-11-17 DIAGNOSIS — E1122 Type 2 diabetes mellitus with diabetic chronic kidney disease: Secondary | ICD-10-CM | POA: Diagnosis not present

## 2016-11-17 NOTE — Progress Notes (Signed)
Location:   Customer service manager of Service:  SNF (31) Provider:   Cindi Carbon, ANP [place] [phone]   Gayland Curry, DO  Patient Care Team: Gayland Curry, DO as PCP - General (Geriatric Medicine) Fay Records, Well [name]  Extended Emergency Contact Information Primary Emergency Contact: [facility] Address: [address]          [place], [place] [address] [name] of [place] Phone: [phone] Mobile Phone: [phone] Relation: Daughter  Code Status: DNR Goals of care: Advanced Directive information Advanced Directives 11/17/2016  Does Patient Have a Medical Advance Directive? Yes  Type of Paramedic of [place];Living will;Out of facility DNR (pink MOST or yellow form)  Does patient want to make changes to medical advance directive? -  Copy of [place] in Chart? Yes  Pre-existing out of facility DNR order (yellow form or pink MOST form) Yellow form placed in chart (order not valid for inpatient use)     Chief Complaint  Patient presents with  . Medical Management of Chronic Issues    HPI:  83 [place]  residing at [facility], skilled care.  I am here to review his chronic medical issues.  VS have been stable over the past month..   DM II: A1C 8% in august, placed on levemir. A1C improved to 6.8 in Dec of 2017 (insurance would not cover lantus or toujeo).   Eye exam on 07/06/16. CBGS have been running higher the past 5 days since Father's day as he has been eating candy gifted to him.  AM 161-207, lunch 195-329 dinner 154-229. If CBGs are greater than 200 he gets 5 units of Novolog which he has needed regularly for the past 5 days. He takes one glucerna a day but eats most of his meals and his weight is trending upward to 171 l bs.   Mixed AD/Vascular dementia: MMSE 12/30 on 12/22/15, 08/16/16 MMSE 16/30, currently on Aricept. Remains pleasant and able to f/c.   Continues to participate in activities.    BP controlled with metoprolol 75 mg BID  He has a hx of bipolar. Off Anafranil with no issues with mood/swings. Remains on low dose seroquel with no reports of behavioral issues  BPH: denies any issues with urination, is incontinent most of the time, takes Flomax qhs   Past Medical History:  Diagnosis Date  . Anemia, iron deficiency 12/30/2012  . Basal cell carcinoma   . Bipolar 1 disorder ([place])   . CKD (chronic kidney disease), stage II 07/09/2012   07/16/12:   stage II chronic kidney disease by GFR.  Follow lab at interval    . Dementia   . Diabetes ([place])   . GI bleed   . Hypertension   . 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 Pt. is aware of deficit. Will benefit from [place] environment for assistance, supervision, socialization    07/16/12:  Admission MDS reviewed: BIMS score 13/15,  functional status: Pt requires limited to extensive assist with all ADLs except eating (supervision)    . Pressure ulcer of sacrum 12/07/2012  . Squamous cell carcinoma of skin of scalp 01/24/14   anterior and posterior scalp   . Stroke [facility]) 08/2009   Left side with residual deficit   Past Surgical History:  Procedure Laterality Date  . HIP ARTHROPLASTY Left 12/01/2012   Procedure: ARTHROPLASTY BIPOLAR HIP;  Surgeon: Johnn Hai,  MD;  Location: [facility];  Service: Orthopedics;  Laterality: Left;  . SKIN LESION EXCISION  01/24/14   Anterior and posterio scalp Squamous cell [name] PA    No Known Allergies  Allergies as of 11/17/2016   No Known Allergies     Medication List       Accurate as of 11/17/16 10:56 AM. Always use your most recent med list.          acetaminophen 325 MG tablet Commonly known as:  TYLENOL Take 650 mg by mouth every 6 (six) hours as needed for pain.   antiseptic oral rinse Liqd 15 mLs by Mouth Rinse route 4 (four) times daily.   aspirin 81  MG tablet Take 81 mg by mouth daily.   CENTRUM SILVER 50+MEN Tabs Take by mouth daily.   cholecalciferol 1000 units tablet Commonly known as:  VITAMIN D Take 1,000 Units by mouth every morning.   donepezil 10 MG tablet Commonly known as:  ARICEPT Take 10 mg by mouth daily.   GLUCERNA Liqd Take 237 mLs by mouth daily.   insulin aspart 100 UNIT/ML injection Commonly known as:  novoLOG Inject into the skin 3 (three) times daily before meals. Give 5 units if CBG >200, call if >400. Must eat >50% to administer   ketoconazole 2 % shampoo Commonly known as:  NIZORAL Apply 1 application topically 2 (two) times a week.   latanoprost 0.005 % ophthalmic solution Commonly known as:  XALATAN Place 1 drop into both eyes at bedtime.   LEVEMIR FLEXTOUCH 100 UNIT/ML Pen Generic drug:  Insulin Detemir Inject 8 Units into the skin 2 (two) times daily. Hold for CBG <85   metoprolol tartrate 25 MG tablet Commonly known as:  LOPRESSOR Take 75 mg by mouth 2 (two) times daily.   polyethylene glycol packet Commonly known as:  MIRALAX / GLYCOLAX Take 17 g by mouth daily.   QUEtiapine 25 MG tablet Commonly known as:  SEROQUEL Take 12.5 mg by mouth at bedtime.   sennosides-docusate sodium 8.6-50 MG tablet Commonly known as:  SENOKOT-S Take 1 tablet by mouth daily.   tamsulosin 0.4 MG Caps capsule Commonly known as:  FLOMAX Take 0.4 mg by mouth daily after breakfast.       Review of Systems  Constitutional: Negative for chills, diaphoresis, fever, malaise/fatigue and weight loss.  Respiratory: Negative for cough, hemoptysis, sputum production and wheezing.   Cardiovascular: Positive for leg swelling. Negative for chest pain and PND.  Gastrointestinal: Negative for abdominal pain, constipation, diarrhea, nausea and vomiting.  Genitourinary: Negative for dysuria and urgency.  Musculoskeletal: Negative for back pain, falls, joint pain, myalgias and neck pain.  Skin: Negative for  itching and rash.  Neurological: Positive for focal weakness (left sided weakness from previou scva). Negative for weakness.  Psychiatric/Behavioral: Positive for memory loss. Negative for depression.     Immunization History  Administered Date(s) Administered  . Influenza Inj Mdck Quad Pf 03/17/2016  . Influenza Whole 03/13/2013  . Influenza-Unspecified 03/04/2014, 03/12/2015  . PPD Test 07/05/2012  . Pneumococcal Conjugate-13 07/01/2016  . Td 07/01/2016   Pertinent  Health Maintenance Due  Topic Date Due  . HEMOGLOBIN A1C  11/23/2016  . INFLUENZA VACCINE  12/28/2016  . URINE MICROALBUMIN  01/04/2017  . PNA vac Low Risk Adult (2 of 2 - PPSV23) 07/01/2017  . OPHTHALMOLOGY EXAM  07/06/2017  . FOOT EXAM  08/25/2017   Fall Risk  09/28/2016 05/29/2015 03/16/2015 02/05/2015 10/20/2014  Falls in the past  year? No No No No No  Risk for fall due to : - History of fall(s) - - -   Functional Status Survey:    Vitals:   11/17/16 1045  BP: 118/76  Pulse: 64  Resp: 18  Temp: [place].6 F (36.4 C)  SpO2: 99%  Weight: 171 lb 6.4 oz (77.7 kg)   Physical Exam  Constitutional: No distress.  HENT:  Head: Normocephalic and atraumatic.  Neck: Normal range of motion. Neck supple. No JVD present. No tracheal deviation present. No thyromegaly present.  Cardiovascular: Normal rate and regular rhythm.   No murmur heard. Pulmonary/Chest: Effort normal and breath sounds normal. No respiratory distress. He has no wheezes.  Abdominal: Soft. Bowel sounds are normal. He exhibits no distension. There is no tenderness.  Lymphadenopathy:    He has no cervical adenopathy.  Neurological: He is alert.  Left sided weakness. Able to follow commands, alert to self, place, situation (sometimes), not time  Skin: Skin is warm and dry. He is not diaphoretic.     Psychiatric: He has a normal mood and affect.  Nursing note and vitals reviewed.   Labs reviewed:  Recent Labs  01/04/16 07/01/16  NA 136* 144  K  4.7 4.6  BUN 29* 29*  CREATININE 1.8* 1.3   No results for input(s): AST, ALT, ALKPHOS, BILITOT, PROT, ALBUMIN in the last 8760 hours.  Recent Labs  01/04/16 07/01/16  WBC 8.8 8.2  HGB 11.9* 11.3*  HCT 35* 33*  PLT 252 168   Lab Results  Component Value Date   TSH 1.13 12/22/2015   Lab Results  Component Value Date   HGBA1C 6.8 05/25/2016   Lab Results  Component Value Date   CHOL 152 06/27/2013   HDL 55 06/27/2013   LDLCALC 78 06/27/2013   TRIG 83 06/27/2013    Significant Diagnostic Results in last 30 days:  No results found.  Assessment/Plan  1. Type 2 diabetes mellitus with stage 3 chronic kidney disease, with long-term current use of insulin (HCC) Continue current regimen Check A1C in am D/C glucerna due to gaining weight, increased sugars, good appetite  2. Bipolar disorder, in full remission, most recent episode mixed ([place]) D/C seroquel and monitor for agitation  3. Mixed Alzheimer's and vascular dementia Continue Aricept as he continues to participate in activities such as bingo   4. Benign prostatic hyperplasia without lower urinary tract symptoms Controlled Continue Flomax 0.4 mg qhs  5. CKD (chronic kidney disease) stage 3, GFR 30-59 ml/min Monitor BMP periodically and avoid nephrotoxic agents  6. Late effects of cerebrovascular disease Continue aspirin 81 mg qd No lipid monitoring due to age/debility  Family/ staff Communication: discussed with staff  Cindi Carbon, [place] [phone]

## 2016-11-18 LAB — HEMOGLOBIN A1C: HEMOGLOBIN A1C: 6.6

## 2016-12-13 ENCOUNTER — Non-Acute Institutional Stay (SKILLED_NURSING_FACILITY): Payer: Medicare Other | Admitting: Internal Medicine

## 2016-12-13 ENCOUNTER — Encounter: Payer: Self-pay | Admitting: Internal Medicine

## 2016-12-13 DIAGNOSIS — N183 Chronic kidney disease, stage 3 (moderate): Secondary | ICD-10-CM

## 2016-12-13 DIAGNOSIS — E1122 Type 2 diabetes mellitus with diabetic chronic kidney disease: Secondary | ICD-10-CM | POA: Diagnosis not present

## 2016-12-13 DIAGNOSIS — N400 Enlarged prostate without lower urinary tract symptoms: Secondary | ICD-10-CM | POA: Diagnosis not present

## 2016-12-13 DIAGNOSIS — F3176 Bipolar disorder, in full remission, most recent episode depressed: Secondary | ICD-10-CM | POA: Diagnosis not present

## 2016-12-13 DIAGNOSIS — G309 Alzheimer's disease, unspecified: Secondary | ICD-10-CM

## 2016-12-13 DIAGNOSIS — F0280 Dementia in other diseases classified elsewhere without behavioral disturbance: Secondary | ICD-10-CM | POA: Diagnosis not present

## 2016-12-13 DIAGNOSIS — F0150 Vascular dementia without behavioral disturbance: Secondary | ICD-10-CM | POA: Diagnosis not present

## 2016-12-13 DIAGNOSIS — Z794 Long term (current) use of insulin: Secondary | ICD-10-CM

## 2016-12-13 DIAGNOSIS — I10 Essential (primary) hypertension: Secondary | ICD-10-CM | POA: Diagnosis not present

## 2016-12-13 NOTE — Progress Notes (Signed)
Patient ID: George Kemp, male   DOB: [birth year], 83 y.o.   MRN: [id number]  Location:  [facility] Room Number: 235 Place of Service:  SNF [phone]) Provider:  Gayland Curry, DO  Patient Care Team: Gayland Curry, DO as PCP - General (Geriatric Medicine) Community, [name]  Extended Emergency Contact Information Primary Emergency Contact: [facility] Address: [address]          [place], [place] [address] [name] of [place] Phone: [phone] Mobile Phone: [phone] Relation: Daughter  Code Status:  DNR Goals of care: Advanced Directive information Advanced Directives 12/13/2016  Does Patient Have a Medical Advance Directive? Yes  Type of Advance Directive Out of facility DNR (pink MOST or yellow form);[place];Living will  Does patient want to make changes to medical advance directive? -  Copy of [place] in Chart? Yes  Pre-existing out of facility DNR order (yellow form or pink MOST form) Yellow form placed in chart (order not valid for inpatient use)     Chief Complaint  Patient presents with  . Medical Management of Chronic Issues    Routine Visit    HPI:  Pt is a 83 y.o. male seen today for medical management of chronic diseases.    He was taken off anafranil at my last visit with him due to TCAs not being recommended in the elderly and lack of signs of depression or bipolar disorder.  He seemed to tolerate that fine.  NP then attempted to stop seroquel on 6/21, but staff reported a behavior and requested it be promptly resumed.  (off only 6 days).  Now, he's not eating well.  He is not aware of this when I saw him.    He reported being in good spirits.  Sleeping well and says he's eating well, but staff report otherwise.  His hba1c was 6.6 on 6/22 which is great, but he has required insulin.  Renal function limited other oral selections and he did not get enough  benefit from tradjenta.    BP has been satisfactory on only metoprolol.  Not on ace/arb but bp will not permit.  BPH:  Staff report no change in urinary frequency and pt unable to provide good history with his dementia.    Dementia:  cognitivie losses progressing.  Remains on aricept only.  Require assist with ADLs in skilled.   Past Medical History:  Diagnosis Date  . Anemia, iron deficiency 12/30/2012  . Basal cell carcinoma   . Bipolar 1 disorder ([place])   . CKD (chronic kidney disease), stage II 07/09/2012   07/16/12:   stage II chronic kidney disease by GFR.  Follow lab at interval    . Dementia   . Diabetes ([place])   . GI bleed   . Hypertension   . 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 Pt. is aware of deficit. Will benefit from [place] environment for assistance, supervision, socialization    07/16/12:  Admission MDS reviewed: BIMS score 13/15,  functional status: Pt requires limited to extensive assist with all ADLs except eating (supervision)    . Pressure ulcer of sacrum 12/07/2012  . Squamous cell carcinoma of skin of scalp 01/24/14   anterior and posterior scalp   . Stroke [facility]) 08/2009   Left side with residual deficit   Past Surgical History:  Procedure Laterality Date  . HIP ARTHROPLASTY Left 12/01/2012  Procedure: ARTHROPLASTY BIPOLAR HIP;  Surgeon: Johnn Hai, MD;  Location: [facility];  Service: Orthopedics;  Laterality: Left;  . SKIN LESION EXCISION  01/24/14   Anterior and posterio scalp Squamous cell [name] PA    No Known Allergies  Outpatient Encounter Prescriptions as of 12/13/2016  Medication Sig  . acetaminophen (TYLENOL) 325 MG tablet Take 650 mg by mouth every 6 (six) hours as needed for pain.  Marland Kitchen antiseptic oral rinse (BIOTENE) LIQD 15 mLs by Mouth Rinse route 4 (four) times daily.  Marland Kitchen aspirin 81 MG tablet Take 81 mg by mouth daily.  . cholecalciferol (VITAMIN D) 1000 UNITS tablet  Take 1,000 Units by mouth every morning.  . donepezil (ARICEPT) 10 MG tablet Take 10 mg by mouth daily.  . Insulin Detemir (LEVEMIR FLEXTOUCH) 100 UNIT/ML Pen Inject 8 Units into the skin 2 (two) times daily. Hold for CBG <85   . insulin lispro (HUMALOG KWIKPEN) 100 UNIT/ML KiwkPen Check CBG prior to each meal. Give 5 units SQ for CBG > 200.(Ensure resident eats greater than 50% of meals when Humalog given.  Marland Kitchen ketoconazole (NIZORAL) 2 % shampoo Apply 1 application topically 2 (two) times a week.  . metoprolol tartrate (LOPRESSOR) 25 MG tablet Take 75 mg by mouth 2 (two) times daily.   . Multiple Vitamins-Minerals (CENTRUM SILVER 50+MEN) TABS Take by mouth daily.  . polyethylene glycol (MIRALAX / GLYCOLAX) packet Take 17 g by mouth every other day.  Marland Kitchen QUEtiapine (SEROQUEL) 25 MG tablet Take 12.5 mg by mouth at bedtime.   . tamsulosin (FLOMAX) 0.4 MG CAPS Take 0.4 mg by mouth daily after breakfast.  . [DISCONTINUED] GLUCERNA (GLUCERNA) LIQD Take 237 mLs by mouth daily.   . [DISCONTINUED] insulin aspart (NOVOLOG) 100 UNIT/ML injection Inject into the skin 3 (three) times daily before meals. Give 5 units if CBG >200, call if >400. Must eat >50% to administer  . [DISCONTINUED] latanoprost (XALATAN) 0.005 % ophthalmic solution Place 1 drop into both eyes at bedtime.  . [DISCONTINUED] polyethylene glycol (MIRALAX / GLYCOLAX) packet Take 17 g by mouth daily. (Patient taking differently: Take 17 g by mouth every other day. )  . [DISCONTINUED] sennosides-docusate sodium (SENOKOT-S) 8.6-50 MG tablet Take 1 tablet by mouth daily.   No facility-administered encounter medications on file as of 12/13/2016.     Review of Systems  Immunization History  Administered Date(s) Administered  . Influenza Inj Mdck Quad Pf 03/17/2016  . Influenza Whole 03/13/2013  . Influenza-Unspecified 03/04/2014, 03/12/2015  . PPD Test 07/05/2012  . Pneumococcal Conjugate-13 07/01/2016  . Td 07/01/2016   Pertinent  Health  Maintenance Due  Topic Date Due  . HEMOGLOBIN A1C  11/23/2016  . INFLUENZA VACCINE  12/28/2016  . URINE MICROALBUMIN  01/04/2017  . PNA vac Low Risk Adult (2 of 2 - PPSV23) 07/01/2017  . OPHTHALMOLOGY EXAM  07/06/2017  . FOOT EXAM  08/25/2017   Fall Risk  09/28/2016 05/29/2015 03/16/2015 02/05/2015 10/20/2014  Falls in the past year? No No No No No  Risk for fall due to : - History of fall(s) - - -   Functional Status Survey:  dependent in adls, can feed himself if foods cut up or finger foods  Vitals:   12/13/16 1354  BP: 113/69  Pulse: 89  Resp: 18  Temp: (!) 97.4 F (36.3 C)  TempSrc: Oral  SpO2: 99%  Weight: 174 lb (78.9 kg)   Body mass index is 24.27 kg/m. Physical Exam  Constitutional: He appears  well-nourished. No distress.  HENT:  Head: Normocephalic and atraumatic.  Cardiovascular: Normal rate, regular rhythm, normal heart sounds and intact distal pulses.   Pulmonary/Chest: Effort normal and breath sounds normal. No respiratory distress. He has no wheezes.  Abdominal: Soft. Bowel sounds are normal. He exhibits no distension. There is no tenderness.  Musculoskeletal: He exhibits no edema or tenderness.  Neurological: He is alert.  Hemiparesis, uses manual wheelchair to get around  Skin: Skin is warm and dry.  Psychiatric: He has a normal mood and affect.    Labs reviewed:  Recent Labs  01/04/16 07/01/16  NA 136* 144  K 4.7 4.6  BUN 29* 29*  CREATININE 1.8* 1.3   No results for input(s): AST, ALT, ALKPHOS, BILITOT, PROT, ALBUMIN in the last 8760 hours.  Recent Labs  01/04/16 07/01/16  WBC 8.8 8.2  HGB 11.9* 11.3*  HCT 35* 33*  PLT 252 168   Lab Results  Component Value Date   TSH 1.13 12/22/2015   Lab Results  Component Value Date   HGBA1C 6.6 11/18/2016   Lab Results  Component Value Date   CHOL 152 06/27/2013   HDL 55 06/27/2013   LDLCALC 78 06/27/2013   TRIG 83 06/27/2013   Assessment/Plan 1. Type 2 diabetes mellitus with stage 3  chronic kidney disease, with long-term current use of insulin (HCC) -control adequate with current regimen of insulin, cont same and monitor  2. Bipolar disorder, in full remission, most recent episode depressed ([place]) -has been stable, keep seroquel and monitor off anafranil--if needs an antidpressant or a medication to control the anxiety would use an ssri or snri rather than TCA  3. Mixed Alzheimer's and vascular dementia -cont aricept due to continued level of activity and participation  4. Benign prostatic hyperplasia without lower urinary tract symptoms -continues on flomax, not clear if this has changed  5. Essential hypertension -bp at goal with lopressor  Family/ staff Communication: discussed with SNF nurse  Labs/tests ordered:  No new, check hba1c, bmp, cbc, q 6 mos  Haydyn Liddell L. Deyna Carbon, D.O. [facility] [address], [place] [address] Cell Phone (Mon-Fri 8am-5pm):  [phone] On Call:  [phone] & follow prompts after 5pm & weekends Office Phone:  [phone] Office Fax:  [phone]

## 2017-01-06 ENCOUNTER — Encounter: Payer: Self-pay | Admitting: Adult Health

## 2017-01-06 ENCOUNTER — Non-Acute Institutional Stay (SKILLED_NURSING_FACILITY): Payer: Medicare Other | Admitting: Adult Health

## 2017-01-06 DIAGNOSIS — G309 Alzheimer's disease, unspecified: Secondary | ICD-10-CM

## 2017-01-06 DIAGNOSIS — D638 Anemia in other chronic diseases classified elsewhere: Secondary | ICD-10-CM | POA: Diagnosis not present

## 2017-01-06 DIAGNOSIS — N183 Chronic kidney disease, stage 3 unspecified: Secondary | ICD-10-CM

## 2017-01-06 DIAGNOSIS — F0150 Vascular dementia without behavioral disturbance: Secondary | ICD-10-CM | POA: Diagnosis not present

## 2017-01-06 DIAGNOSIS — I10 Essential (primary) hypertension: Secondary | ICD-10-CM

## 2017-01-06 DIAGNOSIS — N182 Chronic kidney disease, stage 2 (mild): Secondary | ICD-10-CM

## 2017-01-06 DIAGNOSIS — E1122 Type 2 diabetes mellitus with diabetic chronic kidney disease: Secondary | ICD-10-CM

## 2017-01-06 DIAGNOSIS — K5901 Slow transit constipation: Secondary | ICD-10-CM

## 2017-01-06 DIAGNOSIS — I6990 Unspecified sequelae of unspecified cerebrovascular disease: Secondary | ICD-10-CM | POA: Diagnosis not present

## 2017-01-06 DIAGNOSIS — F0280 Dementia in other diseases classified elsewhere without behavioral disturbance: Secondary | ICD-10-CM

## 2017-01-06 NOTE — Progress Notes (Signed)
Location:   Customer service manager of Service:  SNF (31) Provider:   Cindi Carbon, ANP [place] [phone]   Gayland Curry, DO  Patient Care Team: Gayland Curry, DO as PCP - General (Geriatric Medicine) [facility], [name]  Extended Emergency Contact Information Primary Emergency Contact: [facility] Address: [address]          [place], [place] [address] [name] of [place] Phone: [phone] Mobile Phone: [phone] Relation: Daughter  Code Status: DNR Goals of care: Advanced Directive information Advanced Directives 01/06/2017  Does Patient Have a Medical Advance Directive? Yes  Type of Advance Directive Out of facility DNR (pink MOST or yellow form);[place];Living will  Does patient want to make changes to medical advance directive? -  Copy of [place] in Chart? Yes  Pre-existing out of facility DNR order (yellow form or pink MOST form) Yellow form placed in chart (order not valid for inpatient use)     Chief Complaint  Patient presents with  . Medical Management of Chronic Issues    HPI:  83 y.o.male  residing at [facility], skilled care.  I am here to review his chronic medical issues.  He has a hx of CVA with left sided hemiparesis.  Currently uses a wheelchair and needs assistance with ADLs.    DM II: A1C 6.6 in June of 2018.   Eye exam on 07/06/16. CBGS AM 184-242, lunch 162-237 dinner 173-241. If CBGs are greater than 200 he gets 5 units of Novolog.    Mixed AD/Vascular dementia: MMSE 12/30 on 12/22/15, 08/16/16 MMSE 16/30, currently on Aricept. Remains pleasant and able to f/c.  Continues to participate in activities but shows signs of progressive cognitive decline and less verbal.    BP controlled with metoprolol 75 mg BID  Constipation: takes miralax QOD but has frequent BMs  CKD III Lab Results  Component Value Date   BUN 29 (A) 07/01/2016    Lab Results  Component Value Date   CREATININE 1.3 07/01/2016      Past Medical History:  Diagnosis Date  . Anemia, iron deficiency 12/30/2012  . Basal cell carcinoma   . Bipolar 1 disorder ([place])   . CKD (chronic kidney disease), stage II 07/09/2012   07/16/12:   stage II chronic kidney disease by GFR.  Follow lab at interval    . Dementia   . Diabetes ([place])   . GI bleed   . Hypertension   . 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 Pt. is aware of deficit. Will benefit from [place] environment for assistance, supervision, socialization    07/16/12:  Admission MDS reviewed: BIMS score 13/15,  functional status: Pt requires limited to extensive assist with all ADLs except eating (supervision)    . Pressure ulcer of sacrum 12/07/2012  . Squamous cell carcinoma of skin of scalp 01/24/14   anterior and posterior scalp   . Stroke [facility]) 08/2009   Left side with residual deficit   Past Surgical History:  Procedure Laterality Date  . HIP ARTHROPLASTY Left 12/01/2012   Procedure: ARTHROPLASTY BIPOLAR HIP;  Surgeon: Johnn Hai, MD;  Location: [facility];  Service: Orthopedics;  Laterality: Left;  . SKIN LESION EXCISION  01/24/14   Anterior and posterio scalp Squamous cell [name] PA    No Known Allergies  Allergies as of 01/06/2017   No Known Allergies  Medication List       Accurate as of 01/06/17 11:43 AM. Always use your most recent med list.          acetaminophen 325 MG tablet Commonly known as:  TYLENOL Take 650 mg by mouth every 6 (six) hours as needed for pain.   antiseptic oral rinse Liqd 15 mLs by Mouth Rinse route 4 (four) times daily.   aspirin 81 MG tablet Take 81 mg by mouth daily.   CENTRUM SILVER 50+MEN Tabs Take by mouth daily.   cholecalciferol 1000 units tablet Commonly known as:  VITAMIN D Take 1,000 Units by mouth every morning.   donepezil 10 MG tablet Commonly known  as:  ARICEPT Take 10 mg by mouth daily.   HUMALOG KWIKPEN 100 UNIT/ML KiwkPen Generic drug:  insulin lispro Check CBG prior to each meal. Give 5 units SQ for CBG > 200.(Ensure resident eats greater than 50% of meals when Humalog given.   ketoconazole 2 % shampoo Commonly known as:  NIZORAL Apply 1 application topically 2 (two) times a week.   LEVEMIR FLEXTOUCH 100 UNIT/ML Pen Generic drug:  Insulin Detemir Inject 8 Units into the skin 2 (two) times daily. Hold for CBG <85   metoprolol tartrate 25 MG tablet Commonly known as:  LOPRESSOR Take 75 mg by mouth 2 (two) times daily.   polyethylene glycol packet Commonly known as:  MIRALAX / GLYCOLAX Take 17 g by mouth every other day.   QUEtiapine 25 MG tablet Commonly known as:  SEROQUEL Take 12.5 mg by mouth at bedtime.   tamsulosin 0.4 MG Caps capsule Commonly known as:  FLOMAX Take 0.4 mg by mouth daily after breakfast.   XALATAN 0.005 % ophthalmic solution Generic drug:  latanoprost 1 drop at bedtime.       Review of Systems  Constitutional: Negative for chills, diaphoresis, fever, malaise/fatigue and weight loss.  Respiratory: Negative for cough, hemoptysis, sputum production and wheezing.   Cardiovascular: Positive for leg swelling. Negative for chest pain and PND.  Gastrointestinal: Negative for abdominal pain, constipation, diarrhea, nausea and vomiting.  Genitourinary: Negative for dysuria and urgency.  Musculoskeletal: Negative for back pain, falls, joint pain, myalgias and neck pain.  Skin: Negative for itching and rash.  Neurological: Positive for focal weakness (left sided weakness from previou scva). Negative for weakness.  Psychiatric/Behavioral: Positive for memory loss. Negative for depression.     Immunization History  Administered Date(s) Administered  . Influenza Inj Mdck Quad Pf 03/17/2016  . Influenza Whole 03/13/2013  . Influenza-Unspecified 03/04/2014, 03/12/2015  . PPD Test 07/05/2012  .  Pneumococcal Conjugate-13 07/01/2016  . Td 07/01/2016   Pertinent  Health Maintenance Due  Topic Date Due  . INFLUENZA VACCINE  12/28/2016  . URINE MICROALBUMIN  01/04/2017  . HEMOGLOBIN A1C  05/20/2017  . PNA vac Low Risk Adult (2 of 2 - PPSV23) 07/01/2017  . OPHTHALMOLOGY EXAM  07/06/2017  . FOOT EXAM  08/25/2017   Fall Risk  09/28/2016 05/29/2015 03/16/2015 02/05/2015 10/20/2014  Falls in the past year? No No No No No  Risk for fall due to : - History of fall(s) - - -   Functional Status Survey:    Vitals:   01/06/17 1140  BP: (!) 115/59  Pulse: 82  Resp: 20  Temp: 98.4 F (36.9 C)  SpO2: 98%  Weight: 169 lb 3.2 oz (76.7 kg)   Physical Exam  Constitutional: No distress.  HENT:  Head: Normocephalic and atraumatic.  Neck: Normal range of  motion. Neck supple. No JVD present. No tracheal deviation present. No thyromegaly present.  Cardiovascular: Normal rate and regular rhythm.   No murmur heard. Pulmonary/Chest: Effort normal and breath sounds normal. No respiratory distress. He has no wheezes.  Abdominal: Soft. Bowel sounds are normal. He exhibits no distension. There is no tenderness.  Lymphadenopathy:    He has no cervical adenopathy.  Neurological: He is alert.  Left sided weakness. Able to follow commands, alert to self, place, situation (sometimes), not time  Skin: Skin is warm and dry. He is not diaphoretic.     Psychiatric: He has a normal mood and affect.  Nursing note and vitals reviewed.   Labs reviewed:  Recent Labs  07/01/16  NA 144  K 4.6  BUN 29*  CREATININE 1.3   No results for input(s): AST, ALT, ALKPHOS, BILITOT, PROT, ALBUMIN in the last 8760 hours.  Recent Labs  07/01/16  WBC 8.2  HGB 11.3*  HCT 33*  PLT 168   Lab Results  Component Value Date   TSH 1.13 12/22/2015   Lab Results  Component Value Date   HGBA1C 6.6 11/18/2016   Lab Results  Component Value Date   CHOL 152 06/27/2013   HDL 55 06/27/2013   LDLCALC 78 06/27/2013     TRIG 83 06/27/2013    Significant Diagnostic Results in last 30 days:  No results found.  Assessment/Plan  1. Slow transit constipation Reduce miralax to 1/2 cap QOD due to frequent stools  2. Type 2 diabetes mellitus with stage 2 chronic kidney disease, without long-term current use of insulin (HCC) Controlled, goal A1C<8% Continue levemir 8 units BID and meal coverage as needed  3. Anemia of chronic disease Due to chronic CKD, also hx of heme pos stools with dropping H/H when placed on Plavix Check CBC  4. CKD (chronic kidney disease) stage 3, GFR 30-59 ml/min Continue to periodically monitor BMP and avoid nephrotoxic agents  5. Essential hypertension Controlled Continue Metoprolol 75 mg BID  6. 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 Mixed dementia AD/Vascular Progressive cognitive and functional decline  Continue Aricept   Family/ staff Communication: discussed with staff  Labs: CBC and BMP  Cindi Carbon, [place] [phone]

## 2017-01-09 LAB — CBC AND DIFFERENTIAL
HCT: 36 — AB (ref 41–53)
Hemoglobin: 11.6 — AB (ref 13.5–17.5)
PLATELETS: 187 (ref 150–399)
WBC: 8.1

## 2017-01-09 LAB — BASIC METABOLIC PANEL
BUN: 22 — AB (ref 4–21)
CREATININE: 1.3 (ref 0.6–1.3)
GLUCOSE: 216
POTASSIUM: 4.2 (ref 3.4–5.3)
Sodium: 145 (ref 137–147)

## 2017-01-10 ENCOUNTER — Non-Acute Institutional Stay (SKILLED_NURSING_FACILITY): Payer: Medicare Other | Admitting: Internal Medicine

## 2017-01-10 ENCOUNTER — Encounter: Payer: Self-pay | Admitting: Internal Medicine

## 2017-01-10 DIAGNOSIS — F3175 Bipolar disorder, in partial remission, most recent episode depressed: Secondary | ICD-10-CM | POA: Diagnosis not present

## 2017-01-10 DIAGNOSIS — R634 Abnormal weight loss: Secondary | ICD-10-CM | POA: Diagnosis not present

## 2017-01-10 DIAGNOSIS — F0150 Vascular dementia without behavioral disturbance: Secondary | ICD-10-CM | POA: Diagnosis not present

## 2017-01-10 DIAGNOSIS — G309 Alzheimer's disease, unspecified: Secondary | ICD-10-CM

## 2017-01-10 DIAGNOSIS — F0280 Dementia in other diseases classified elsewhere without behavioral disturbance: Secondary | ICD-10-CM

## 2017-01-10 NOTE — Progress Notes (Signed)
Patient ID: George Kemp, male   DOB: 1926-11-21, 83 y.o.   MRN: [id number]  Location:  [facility] Room Number: 580 Place of Service:  SNF ([phone]) Provider:   Gayland Curry, DO  Patient Care Team: Gayland Curry, DO as PCP - General (Geriatric Medicine) Community, Well [place]  Extended Emergency Contact Information Primary Emergency Contact: George Kemp Address: [address]          [place], [place] [address] George Kemp of [place] Phone: [phone] Mobile Phone: [phone] Relation: Daughter  Code Status:  DNR Goals of care: Advanced Directive information Advanced Directives 01/10/2017  Does Patient Have a Medical Advance Directive? -  Type of Advance Directive Out of facility DNR (pink MOST or yellow form);[place];Living will  Does patient want to make changes to medical advance directive? -  Copy of [place] in Chart? Yes  Pre-existing out of facility DNR order (yellow form or pink MOST form) Yellow form placed in chart (order not valid for inpatient use)     Chief Complaint  Patient presents with  . Acute Visit    poor appetite, depression    HPI:  Pt is a 83 y.o. male seen today for an acute visit for poor appetite and depression.  His daughter has noticed that since his anafranil was stopped due to not being recommended in his age group based on the [facility] list, he's been more flat, appetite has declined.  I, too, see this at his visit today.  We spoke about potentially trying remeron instead, but Ann preferred we just go back to the anafranil b/c he tolerated it well for many years.  He also was only off seroquel briefly and put back on due to behavioral changes noted by evening staff.  Reviewing his weight trend, his weight was 159 lbs in April when I changed the anafranil, but trended up at first until July when it peaked at 174 and has been dropping since then to 169 this  month.   Past Medical History:  Diagnosis Date  . Anemia, iron deficiency 12/30/2012  . Basal cell carcinoma   . Bipolar 1 disorder ([place])   . CKD (chronic kidney disease), stage II 07/09/2012   07/16/12:   stage II chronic kidney disease by GFR.  Follow lab at interval    . Dementia   . Diabetes ([place])   . GI bleed   . Hypertension   . 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 Pt. is aware of deficit. Will benefit from [place] environment for assistance, supervision, socialization    07/16/12:  Admission MDS reviewed: BIMS score 13/15,  functional status: Pt requires limited to extensive assist with all ADLs except eating (supervision)    . Pressure ulcer of sacrum 12/07/2012  . Squamous cell carcinoma of skin of scalp 01/24/14   anterior and posterior scalp   . Stroke [facility]) 08/2009   Left side with residual deficit   Past Surgical History:  Procedure Laterality Date  . HIP ARTHROPLASTY Left 12/01/2012   Procedure: ARTHROPLASTY BIPOLAR HIP;  Surgeon: Johnn Hai, MD;  Location: [facility];  Service: Orthopedics;  Laterality: Left;  . SKIN LESION EXCISION  01/24/14   Anterior and posterio scalp Squamous cell [name] PA    No Known Allergies  Outpatient Encounter Prescriptions as of 01/10/2017  Medication Sig  . acetaminophen (TYLENOL) 325 MG tablet Take  650 mg by mouth every 6 (six) hours as needed for pain.  Marland Kitchen antiseptic oral rinse (BIOTENE) LIQD 15 mLs by Mouth Rinse route 4 (four) times daily.  Marland Kitchen aspirin 81 MG tablet Take 81 mg by mouth daily.  . cholecalciferol (VITAMIN D) 1000 UNITS tablet Take 1,000 Units by mouth every morning.  . donepezil (ARICEPT) 10 MG tablet Take 10 mg by mouth daily.  . Insulin Detemir (LEVEMIR FLEXTOUCH) 100 UNIT/ML Pen Inject 8 Units into the skin 2 (two) times daily. Hold for CBG <85   . insulin lispro (HUMALOG KWIKPEN) 100 UNIT/ML KiwkPen Check CBG prior to each meal. Give 5 units  SQ for CBG > 200.(Ensure resident eats greater than 50% of meals when Humalog given.  Marland Kitchen ketoconazole (NIZORAL) 2 % shampoo Apply 1 application topically 2 (two) times a week.  . latanoprost (XALATAN) 0.005 % ophthalmic solution 1 drop at bedtime.  . metoprolol tartrate (LOPRESSOR) 25 MG tablet Take 75 mg by mouth 2 (two) times daily.   . Multiple Vitamins-Minerals (CENTRUM SILVER 50+MEN) TABS Take by mouth daily.  . polyethylene glycol (MIRALAX / GLYCOLAX) packet Take 17 g by mouth every other day.  Marland Kitchen QUEtiapine (SEROQUEL) 25 MG tablet Take 12.5 mg by mouth at bedtime.   . tamsulosin (FLOMAX) 0.4 MG CAPS Take 0.4 mg by mouth daily after breakfast.   No facility-administered encounter medications on file as of 01/10/2017.     Review of Systems  Unable to perform ROS: Dementia (obtained from his daughter and staff)  Constitutional: Positive for activity change, appetite change, fatigue and unexpected weight change. Negative for chills, diaphoresis and fever.       Sleeping more  Psychiatric/Behavioral: Positive for confusion. Negative for agitation, behavioral problems, hallucinations, self-injury, sleep disturbance and suicidal ideas. The patient is not nervous/anxious.        Flat affect, decreased intake    Immunization History  Administered Date(s) Administered  . Influenza Inj Mdck Quad Pf 03/17/2016  . Influenza Whole 03/13/2013  . Influenza-Unspecified 03/04/2014, 03/12/2015  . PPD Test 07/05/2012  . Pneumococcal Conjugate-13 07/01/2016  . Td 07/01/2016   Pertinent  Health Maintenance Due  Topic Date Due  . INFLUENZA VACCINE  12/28/2016  . URINE MICROALBUMIN  01/04/2017  . HEMOGLOBIN A1C  05/20/2017  . PNA vac Low Risk Adult (2 of 2 - PPSV23) 07/01/2017  . OPHTHALMOLOGY EXAM  07/06/2017  . FOOT EXAM  08/25/2017   Fall Risk  09/28/2016 05/29/2015 03/16/2015 02/05/2015 10/20/2014  Falls in the past year? No No No No No  Risk for fall due to : - History of fall(s) - - -    Functional Status Survey:  dependent in adls, needs some help with meals also  Vitals:   01/10/17 1421  BP: 118/69  Pulse: 87  Resp: 16  Temp: 98.3 F (36.8 C)  TempSrc: Oral  SpO2: 98%  Weight: 169 lb (76.7 kg)   Body mass index is 23.57 kg/m. Physical Exam  Constitutional: He appears well-developed. No distress.  Cardiovascular: Normal rate, regular rhythm and normal heart sounds.   Pulmonary/Chest: Effort normal and breath sounds normal. No respiratory distress.  Abdominal: Soft. Bowel sounds are normal.  Musculoskeletal:  Hemiparesis s/p cva  Neurological: He is alert.  Flat affect, less talkative, appears down, dysarthric (not new)  Skin: Skin is warm and dry. There is pallor.  Psychiatric:  See neuro    Labs reviewed:  Recent Labs  07/01/16  NA 144  K 4.6  BUN  29*  CREATININE 1.3   No results for input(s): AST, ALT, ALKPHOS, BILITOT, PROT, ALBUMIN in the last 8760 hours.  Recent Labs  07/01/16  WBC 8.2  HGB 11.3*  HCT 33*  PLT 168   Lab Results  Component Value Date   TSH 1.13 12/22/2015   Lab Results  Component Value Date   HGBA1C 6.6 11/18/2016   Lab Results  Component Value Date   CHOL 152 06/27/2013   HDL 55 06/27/2013   LDLCALC 78 06/27/2013   TRIG 83 06/27/2013    Assessment/Plan 1. Bipolar 1 disorder, depressed, partial remission ([place]) -restart anafranil 25mg  po qhs and monitor sleepiness, appetite, affect -when I spoke with his daughter, Lelon Frohlich, she said she hopes he improves, but understands if this does not -he also remains on low dose seroquel 12.5mg  at bedtime due to behaviors w/o it and his bipolar disorder for which it is indicated as an add-on to an antidepressant  2. Mixed Alzheimer's and vascular dementia -decreased appetite, weight loss and more sleeping may be due to progression of this and frailty, but seems reasonable to see if he does have an improvement back on his longstanding antidepressant---monitoring necessary  to determine if there is a benefit  3. Weight loss -may be multifactorial--see above -monitor for hypoglycemia and reduce insulin with less intake if needed -opted to leave aricept alone, but, it, too, can cause weight loss (he's been on it for a long time, however)--just monitor this for now and discuss with family in the future if we want to attempt to change it  Family/ staff Communication: discussed with snf nurse, pt's daughter  Labs/tests ordered:  No new  Tyler Robidoux L. Nazaret Chea, D.O. [facility] [address], [place] [address] Cell Phone (Mon-Fri 8am-5pm):  [phone] On Call:  [phone] & follow prompts after 5pm & weekends Office Phone:  [phone] Office Fax:  [phone]

## 2017-01-15 DIAGNOSIS — F3176 Bipolar disorder, in full remission, most recent episode depressed: Secondary | ICD-10-CM | POA: Insufficient documentation

## 2017-01-15 MED ORDER — CLOMIPRAMINE HCL 25 MG PO CAPS: 25.0000 mg | ORAL_CAPSULE | Freq: Every day | ORAL | 5 refills | Status: AC

## 2017-01-16 NOTE — Addendum Note (Signed)
Addended by: Hollace Kinnier L on: 01/16/2017 10:07 AM   Modules accepted: Level of Service

## 2017-02-02 ENCOUNTER — Non-Acute Institutional Stay (SKILLED_NURSING_FACILITY): Payer: Medicare Other | Admitting: Adult Health

## 2017-02-02 DIAGNOSIS — F3175 Bipolar disorder, in partial remission, most recent episode depressed: Secondary | ICD-10-CM | POA: Diagnosis not present

## 2017-02-02 DIAGNOSIS — D638 Anemia in other chronic diseases classified elsewhere: Secondary | ICD-10-CM

## 2017-02-02 DIAGNOSIS — N183 Chronic kidney disease, stage 3 unspecified: Secondary | ICD-10-CM

## 2017-02-02 DIAGNOSIS — F0150 Vascular dementia without behavioral disturbance: Secondary | ICD-10-CM | POA: Diagnosis not present

## 2017-02-02 DIAGNOSIS — G309 Alzheimer's disease, unspecified: Secondary | ICD-10-CM | POA: Diagnosis not present

## 2017-02-02 DIAGNOSIS — Z794 Long term (current) use of insulin: Secondary | ICD-10-CM | POA: Diagnosis not present

## 2017-02-02 DIAGNOSIS — N182 Chronic kidney disease, stage 2 (mild): Secondary | ICD-10-CM

## 2017-02-02 DIAGNOSIS — E1122 Type 2 diabetes mellitus with diabetic chronic kidney disease: Secondary | ICD-10-CM

## 2017-02-02 DIAGNOSIS — F0280 Dementia in other diseases classified elsewhere without behavioral disturbance: Secondary | ICD-10-CM

## 2017-02-02 DIAGNOSIS — N400 Enlarged prostate without lower urinary tract symptoms: Secondary | ICD-10-CM

## 2017-02-06 ENCOUNTER — Encounter: Payer: Self-pay | Admitting: Adult Health

## 2017-02-06 NOTE — Progress Notes (Signed)
Location:   Customer service manager of Service:  SNF (31) Provider:   Cindi Carbon, ANP [place] [phone]   Gayland Curry, DO  Patient Care Team: Gayland Curry, DO as PCP - General (Geriatric Medicine) [facility], [facility] [name]  Extended Emergency Contact Information Primary Emergency Contact: [facility] Address: [address]          [place], [place] [address] [name] of [place] Phone: [phone] Mobile Phone: [phone] Relation: Daughter  Code Status: DNR Goals of care: Advanced Directive information Advanced Directives 02/06/2017  Does Patient Have a Medical Advance Directive? Yes  Type of Advance Directive Out of facility DNR (pink MOST or yellow form);[place];Living will  Does patient want to make changes to medical advance directive? -  Copy of [place] in Chart? Yes  Pre-existing out of facility DNR order (yellow form or pink MOST form) Yellow form placed in chart (order not valid for inpatient use)     Chief Complaint  Patient presents with  . Medical Management of Chronic Issues    HPI:  83 y.o.male  residing at [facility], skilled care.  I am here to review his chronic medical issues. He has a hx of CVA with left sided hemiparesis.    He has a hx of Bipolar. Anafranil restarted in August after a trial off.  He experienced weight loss and low appetite and it was therefore restarted. His weight is now back up to 175 lbs, although there was some concern regarding the accuracy of the weights that reflected a loss per staff.  Wt Readings from Last 3 Encounters:  02/06/17 175 lb 3.2 oz (79.5 kg)  01/10/17 169 lb (76.7 kg)  01/06/17 169 lb 3.2 oz (76.7 kg)    ACD:  Lab Results  Component Value Date   HGB 11.6 (A) 01/09/2017   BPH: on flomax, denies urinary symptoms  DM II: A1C 6.6 in June of 2018.   Eye exam on 07/06/16. CBGS AM 119-174, lunch 169-225  dinner 176-218. If CBGs are greater than 200 he gets 5 units of Novolog.    Mixed AD/Vascular dementia: MMSE 12/30 on 12/22/15, 08/16/16 MMSE 16/30, on Aricept.   Functional status: hoyer lift, incontinent   Past Medical History:  Diagnosis Date  . Anemia, iron deficiency 12/30/2012  . Basal cell carcinoma   . Bipolar 1 disorder ([place])   . CKD (chronic kidney disease), stage II 07/09/2012   07/16/12:   stage II chronic kidney disease by GFR.  Follow lab at interval    . Dementia   . Diabetes ([place])   . GI bleed   . Hypertension   . 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 Pt. is aware of deficit. Will benefit from [place] environment for assistance, supervision, socialization    07/16/12:  Admission MDS reviewed: BIMS score 13/15,  functional status: Pt requires limited to extensive assist with all ADLs except eating (supervision)    . Pressure ulcer of sacrum 12/07/2012  . Squamous cell carcinoma of skin of scalp 01/24/14   anterior and posterior scalp   . Stroke [facility]) 08/2009   Left side with residual deficit   Past Surgical History:  Procedure Laterality Date  . HIP ARTHROPLASTY Left 12/01/2012   Procedure: ARTHROPLASTY BIPOLAR HIP;  Surgeon: Johnn Hai, MD;  Location: [facility];  Service: Orthopedics;  Laterality: Left;  . SKIN LESION  EXCISION  01/24/14   Anterior and posterio scalp Squamous cell [name] PA    No Known Allergies   Review of Systems  Constitutional: Negative for chills, diaphoresis, fever, malaise/fatigue and weight loss.  Respiratory: Negative for cough, hemoptysis, sputum production and wheezing.   Cardiovascular: Positive for leg swelling. Negative for chest pain and PND.  Gastrointestinal: Negative for abdominal pain, constipation, diarrhea, nausea and vomiting.  Genitourinary: Negative for dysuria and urgency.  Musculoskeletal: Negative for back pain, falls, joint pain, myalgias and  neck pain.  Skin: Negative for itching and rash.  Neurological: Positive for focal weakness (left sided weakness from previou scva). Negative for weakness.  Psychiatric/Behavioral: Positive for memory loss. Negative for depression.     Immunization History  Administered Date(s) Administered  . Influenza Inj Mdck Quad Pf 03/17/2016  . Influenza Whole 03/13/2013  . Influenza-Unspecified 03/04/2014, 03/12/2015  . PPD Test 07/05/2012  . Pneumococcal Conjugate-13 07/01/2016  . Td 07/01/2016   Pertinent  Health Maintenance Due  Topic Date Due  . INFLUENZA VACCINE  12/28/2016  . URINE MICROALBUMIN  01/04/2017  . HEMOGLOBIN A1C  05/20/2017  . PNA vac Low Risk Adult (2 of 2 - PPSV23) 07/01/2017  . OPHTHALMOLOGY EXAM  07/06/2017  . FOOT EXAM  08/25/2017   Fall Risk  09/28/2016 05/29/2015 03/16/2015 02/05/2015 10/20/2014  Falls in the past year? No No No No No  Risk for fall due to : - History of fall(s) - - -   Functional Status Survey:    Vitals:   02/06/17 1605  BP: 131/78  Pulse: 78  Resp: 20  Temp: (!) 96.4 F (35.8 C)  SpO2: 97%  Weight: 175 lb 3.2 oz (79.5 kg)   Physical Exam  Constitutional: No distress.  HENT:  Head: Normocephalic and atraumatic.  Neck: Normal range of motion. Neck supple. No JVD present. No tracheal deviation present. No thyromegaly present.  Cardiovascular: Normal rate and regular rhythm.   No murmur heard. Pulmonary/Chest: Effort normal and breath sounds normal. No respiratory distress. He has no wheezes.  Abdominal: Soft. Bowel sounds are normal. He exhibits no distension. There is no tenderness.  Lymphadenopathy:    He has no cervical adenopathy.  Neurological: He is alert.  Left sided weakness. Able to follow commands, alert to self, place, situation (sometimes), not time  Skin: Skin is warm and dry. He is not diaphoretic.     Psychiatric: He has a normal mood and affect.  Nursing note and vitals reviewed.   Labs reviewed:  Recent Labs   07/01/16 01/09/17  NA 144 145  K 4.6 4.2  BUN 29* 22*  CREATININE 1.3 1.3   No results for input(s): AST, ALT, ALKPHOS, BILITOT, PROT, ALBUMIN in the last 8760 hours.  Recent Labs  07/01/16 01/09/17  WBC 8.2 8.1  HGB 11.3* 11.6*  HCT 33* 36*  PLT 168 187   Lab Results  Component Value Date   TSH 1.13 12/22/2015   Lab Results  Component Value Date   HGBA1C 6.6 11/18/2016   Lab Results  Component Value Date   CHOL 152 06/27/2013   HDL 55 06/27/2013   LDLCALC 78 06/27/2013   TRIG 83 06/27/2013    Significant Diagnostic Results in last 30 days:  No results found.  Assessment/Plan  1. Type 2 diabetes mellitus with stage 2 chronic kidney disease, with long-term current use of insulin (HCC) Controlled Continue Levemir 8 units BID Monitor A1C periodically  2. Mixed Alzheimer's and vascular dementia Decline in  cognition over the year but still participates in activities Continue Aricept 10 mg qhs  3. Benign prostatic hyperplasia without lower urinary tract symptoms Asymptomatic  Continue Flomax 0.4 mg qhs  4. CKD (chronic kidney disease) stage 3, GFR 30-59 ml/min Continue to periodically monitor BMP and avoid nephrotoxic agents  5. Anemia of chronic disease Stable H/H Continue to monitor CBC periodically  6. Bipolar 1 disorder, depressed, partial remission (HCC) Improved Continue anafranil 25 mg qd and Seroquel 12.5 mg qd   Family/ staff Communication: discussed with staff  Labs: NA  Cindi Carbon, [place] [phone]

## 2017-02-28 ENCOUNTER — Non-Acute Institutional Stay (SKILLED_NURSING_FACILITY): Payer: Medicare Other | Admitting: Internal Medicine

## 2017-02-28 ENCOUNTER — Encounter: Payer: Self-pay | Admitting: Internal Medicine

## 2017-02-28 DIAGNOSIS — G309 Alzheimer's disease, unspecified: Secondary | ICD-10-CM | POA: Diagnosis not present

## 2017-02-28 DIAGNOSIS — E1122 Type 2 diabetes mellitus with diabetic chronic kidney disease: Secondary | ICD-10-CM | POA: Diagnosis not present

## 2017-02-28 DIAGNOSIS — K5901 Slow transit constipation: Secondary | ICD-10-CM | POA: Diagnosis not present

## 2017-02-28 DIAGNOSIS — F3178 Bipolar disorder, in full remission, most recent episode mixed: Secondary | ICD-10-CM

## 2017-02-28 DIAGNOSIS — N182 Chronic kidney disease, stage 2 (mild): Secondary | ICD-10-CM | POA: Diagnosis not present

## 2017-02-28 DIAGNOSIS — F0280 Dementia in other diseases classified elsewhere without behavioral disturbance: Secondary | ICD-10-CM | POA: Diagnosis not present

## 2017-02-28 DIAGNOSIS — F0150 Vascular dementia without behavioral disturbance: Secondary | ICD-10-CM | POA: Diagnosis not present

## 2017-02-28 DIAGNOSIS — I10 Essential (primary) hypertension: Secondary | ICD-10-CM

## 2017-02-28 DIAGNOSIS — N400 Enlarged prostate without lower urinary tract symptoms: Secondary | ICD-10-CM

## 2017-02-28 DIAGNOSIS — Z794 Long term (current) use of insulin: Secondary | ICD-10-CM | POA: Diagnosis not present

## 2017-02-28 NOTE — Progress Notes (Signed)
Location:  [place] Room Number: 630 Place of Service:  SNF (31)SNF Provider:  Sharlynn Seckinger L. Mariea Clonts, D.O., C.M.D.  Gayland Curry, DO  Patient Care Team: Gayland Curry, DO as PCP - General (Geriatric Medicine) Fay Records, [facility]  Extended Emergency Contact Information Primary Emergency Contact: [facility] Address: [address]          [place], [place] [address] [name] of [place] Phone: [phone] Mobile Phone: [phone] Relation: Daughter  Code Status:  DNR Goals of care: Advanced Directive information Advanced Directives 02/06/2017  Does Patient Have a Medical Advance Directive? Yes  Type of Advance Directive Out of facility DNR (pink MOST or yellow form);[place];Living will  Does patient want to make changes to medical advance directive? -  Copy of [place] in Chart? Yes  Pre-existing out of facility DNR order (yellow form or pink MOST form) Yellow form placed in chart (order not valid for inpatient use)     Chief Complaint  Patient presents with  . Medical Management of Chronic Issues    HPI:  Pt is a 83 y.o. male seen today for medical management of chronic diseases. George Kemp is up in his wheelchair today and cruising around the hallways some. He is in good spirits but is normally not very talkative. Answers yes and no questions.   Dementia- On Aricept Last MMSE 16/30 on August 22, 2016. Seems to be stable and not having any behaviors.   Functional Status: Audiological scientist. Incontinence.  Bipolar- Maintained on anafranil 25mg  hs and seroquel 12.5mg  hs. Mood seems stable and has been going out of room on a regular basis. Normally goes to activities.  Missed one today which left TR staff concerned, but nursing feels he's unchanged.  Diabetes Type 2 with Renal insufficiency. Receives Levemir 8 units BID plus meal coverage of humalog 5units  for CBG>200. Staff states that depending his snack he may have a blood sugar over 200 but this does not occur often. Creatinine was 1.3 in August.   Hypertension-Takes metoprolol 75mg  bid and is controlled appropriately for his goals.    BPH- Flomax seems to helping, Patient has no complaints at this times  Constipation- Miralax helping at this time.   Past Medical History:  Diagnosis Date  . Anemia, iron deficiency 12/30/2012  . Basal cell carcinoma   . Bipolar 1 disorder ([place])   . CKD (chronic kidney disease), stage II 07/09/2012   07/16/12:   stage II chronic kidney disease by GFR.  Follow lab at interval    . Dementia   . Diabetes ([place])   . GI bleed   . Hypertension   . 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 Pt. is aware of deficit. Will benefit from [place] environment for assistance, supervision, socialization    07/16/12:  Admission MDS reviewed: BIMS score 13/15,  functional status: Pt requires limited to extensive assist with all ADLs except eating (supervision)    . Pressure ulcer of sacrum 12/07/2012  . Squamous cell carcinoma of skin of scalp 01/24/14   anterior and posterior scalp   . Stroke [facility]) 08/2009   Left side with residual deficit   Past Surgical History:  Procedure Laterality Date  . HIP ARTHROPLASTY Left 12/01/2012   Procedure: ARTHROPLASTY BIPOLAR HIP;  Surgeon: Johnn Hai, MD;  Location: [facility] ORS;  Service: Orthopedics;  Laterality: Left;  . SKIN  LESION EXCISION  01/24/14   Anterior and posterio scalp Squamous cell [name] PA    No Known Allergies  Outpatient Encounter Prescriptions as of 02/28/2017  Medication Sig  . acetaminophen (TYLENOL) 325 MG tablet Take 650 mg by mouth every 6 (six) hours as needed for pain.  Marland Kitchen antiseptic oral rinse (BIOTENE) LIQD 15 mLs by Mouth Rinse route 4 (four) times daily.  Marland Kitchen aspirin 81 MG tablet Take 81 mg by mouth daily.  . cholecalciferol  (VITAMIN D) 1000 UNITS tablet Take 1,000 Units by mouth every morning.  . clomiPRAMINE (ANAFRANIL) 25 MG capsule Take 1 capsule (25 mg total) by mouth at bedtime.  . donepezil (ARICEPT) 10 MG tablet Take 10 mg by mouth daily.  . Insulin Detemir (LEVEMIR FLEXTOUCH) 100 UNIT/ML Pen Inject 8 Units into the skin 2 (two) times daily. Hold for CBG <85   . insulin lispro (HUMALOG KWIKPEN) 100 UNIT/ML KiwkPen Check CBG prior to each meal. Give 5 units SQ for CBG > 200.(Ensure resident eats greater than 50% of meals when Humalog given.  Marland Kitchen ketoconazole (NIZORAL) 2 % shampoo Apply 1 application topically 2 (two) times a week.  . latanoprost (XALATAN) 0.005 % ophthalmic solution 1 drop at bedtime.  . metoprolol tartrate (LOPRESSOR) 25 MG tablet Take 75 mg by mouth 2 (two) times daily.   . Multiple Vitamins-Minerals (CENTRUM SILVER 50+MEN) TABS Take by mouth daily.  . polyethylene glycol (MIRALAX / GLYCOLAX) packet Take 17 g by mouth every other day. Take 1/2 cap qod  . QUEtiapine (SEROQUEL) 25 MG tablet Take 12.5 mg by mouth at bedtime.   . tamsulosin (FLOMAX) 0.4 MG CAPS Take 0.4 mg by mouth daily after breakfast.   No facility-administered encounter medications on file as of 02/28/2017.     Review of Systems  Constitutional: Negative for malaise/fatigue and weight loss.  HENT: Positive for hearing loss.   Eyes: Negative for blurred vision.       Glasses  Respiratory: Negative for cough, shortness of breath and wheezing.   Cardiovascular: Positive for leg swelling. Negative for chest pain and orthopnea.  Gastrointestinal: Positive for constipation. Negative for abdominal pain and heartburn.  Genitourinary: Negative for urgency.       Incontinent  Musculoskeletal: Negative for falls.  Skin: Negative for itching and rash.  Neurological: Positive for weakness. Negative for dizziness, speech change and headaches.  Endo/Heme/Allergies: Negative for polydipsia. Does not bruise/bleed easily.    Psychiatric/Behavioral: Positive for depression and memory loss. Negative for suicidal ideas. The patient does not have insomnia.     Immunization History  Administered Date(s) Administered  . Influenza Inj Mdck Quad Pf 03/17/2016  . Influenza Whole 03/13/2013  . Influenza-Unspecified 03/04/2014, 03/12/2015  . PPD Test 07/05/2012  . Pneumococcal Conjugate-13 07/01/2016  . Td 07/01/2016   Pertinent  Health Maintenance Due  Topic Date Due  . INFLUENZA VACCINE  12/28/2016  . URINE MICROALBUMIN  01/04/2017  . HEMOGLOBIN A1C  05/20/2017  . PNA vac Low Risk Adult (2 of 2 - PPSV23) 07/01/2017  . OPHTHALMOLOGY EXAM  07/06/2017  . FOOT EXAM  08/25/2017   Fall Risk  09/28/2016 05/29/2015 03/16/2015 02/05/2015 10/20/2014  Falls in the past year? No No No No No  Risk for fall due to : - History of fall(s) - - -   Functional Status Survey:  Hoyer lift and full assist with ADL. Feeds self when food cut into smaller bites.  There were no vitals filed for this visit. There is no  height or weight on file to calculate BMI. Physical Exam  Constitutional: He appears well-developed and well-nourished.  HENT:  Head: Normocephalic.  Eyes: Pupils are equal, round, and reactive to light.  Neck: No JVD present.  Cardiovascular: Normal rate, regular rhythm and normal heart sounds.   Pulmonary/Chest: Effort normal and breath sounds normal. No respiratory distress.  Abdominal: Soft. Bowel sounds are normal.  Musculoskeletal:  Left hemiparesis  Neurological: He is alert. A cranial nerve deficit is present.  Focal weakness on left d/t previous CVA. Answers yes and no questions   Skin: Skin is warm and dry.  Psychiatric: His speech is normal. Judgment and thought content normal. He is not withdrawn. Cognition and memory are impaired. He exhibits abnormal recent memory and abnormal remote memory.  Somewhat flat affect    Labs reviewed:  Recent Labs  07/01/16 01/09/17  NA 144 145  K 4.6 4.2  BUN 29*  22*  CREATININE 1.3 1.3   No results for input(s): AST, ALT, ALKPHOS, BILITOT, PROT, ALBUMIN in the last 8760 hours.  Recent Labs  07/01/16 01/09/17  WBC 8.2 8.1  HGB 11.3* 11.6*  HCT 33* 36*  PLT 168 187   Lab Results  Component Value Date   TSH 1.13 12/22/2015   Lab Results  Component Value Date   HGBA1C 6.6 11/18/2016   Lab Results  Component Value Date   CHOL 152 06/27/2013   HDL 55 06/27/2013   LDLCALC 78 06/27/2013   TRIG 83 06/27/2013    Significant Diagnostic Results in last 30 days:  No results found.  Assessment/Plan 1. Essential hypertension Continue on Metoprolol 75mg  twice daily, at goal  2. Mixed Alzheimer's and vascular dementia Continue on Aricept, gradually progressing as expected, needs help with adls  3. Type 2 diabetes mellitus with stage 2 chronic kidney disease, with long-term current use of insulin (HCC) Blood sugars well controlled on current regimen on Levemir 8units bid and meal coverage.   4. Benign prostatic hyperplasia without lower urinary tract symptoms Continue Flomax as ordered  5. Bipolar disorder, in full remission, most recent episode mixed ([place]) Managed well on Anafranil and Seroquel 12.5mg  HS--dose reductions were unsuccessful and his daughter would like for [place] to leave these alone b/c he took a step backward when we attempted these changes  6. Slow transit constipation On Miralax with good results.  Family/ staff Communication: Discussed with SNF nurse  Labs/tests ordered:  A1c due in November    Shine Scrogham L. Zahari Xiang, D.O. [facility] [address], [place] [address] Cell Phone (Mon-Fri 8am-5pm):  [phone] On Call:  [phone] & follow prompts after 5pm & weekends Office Phone:  [phone] Office Fax:  [phone]

## 2017-03-31 ENCOUNTER — Non-Acute Institutional Stay (SKILLED_NURSING_FACILITY): Payer: Medicare Other | Admitting: Adult Health

## 2017-03-31 DIAGNOSIS — E1122 Type 2 diabetes mellitus with diabetic chronic kidney disease: Secondary | ICD-10-CM | POA: Diagnosis not present

## 2017-03-31 DIAGNOSIS — N182 Chronic kidney disease, stage 2 (mild): Secondary | ICD-10-CM | POA: Diagnosis not present

## 2017-03-31 DIAGNOSIS — I6990 Unspecified sequelae of unspecified cerebrovascular disease: Secondary | ICD-10-CM

## 2017-03-31 DIAGNOSIS — N183 Chronic kidney disease, stage 3 unspecified: Secondary | ICD-10-CM

## 2017-03-31 DIAGNOSIS — G309 Alzheimer's disease, unspecified: Secondary | ICD-10-CM | POA: Diagnosis not present

## 2017-03-31 DIAGNOSIS — F0150 Vascular dementia without behavioral disturbance: Secondary | ICD-10-CM

## 2017-03-31 DIAGNOSIS — Z794 Long term (current) use of insulin: Secondary | ICD-10-CM | POA: Diagnosis not present

## 2017-03-31 DIAGNOSIS — I10 Essential (primary) hypertension: Secondary | ICD-10-CM

## 2017-03-31 DIAGNOSIS — F3178 Bipolar disorder, in full remission, most recent episode mixed: Secondary | ICD-10-CM | POA: Diagnosis not present

## 2017-03-31 DIAGNOSIS — F0280 Dementia in other diseases classified elsewhere without behavioral disturbance: Secondary | ICD-10-CM

## 2017-03-31 DIAGNOSIS — E559 Vitamin D deficiency, unspecified: Secondary | ICD-10-CM | POA: Diagnosis not present

## 2017-03-31 NOTE — Progress Notes (Signed)
Location:   Customer service manager of Service:  SNF (31) Provider:   Cindi Carbon, ANP [place] [phone]   Gayland Curry, DO  Patient Care Team: Gayland Curry, DO as PCP - General (Geriatric Medicine) Fay Records, [name]  Extended Emergency Contact Information Primary Emergency Contact: [facility] Address: [address]          [place], [place] [address] [name] of [place] Phone: [phone] Mobile Phone: [phone] Relation: Daughter  Code Status: DNR Goals of care: Advanced Directive information Advanced Directives 02/28/2017  Does Patient Have a Medical Advance Directive? Yes  Type of Paramedic of [place];Living will;Out of facility DNR (pink MOST or yellow form)  Does patient want to make changes to medical advance directive? -  Copy of [place] in Chart? Yes  Pre-existing out of facility DNR order (yellow form or pink MOST form) Yellow form placed in chart (order not valid for inpatient use)     Chief Complaint  Patient presents with  . Medical Management of Chronic Issues    HPI:  83 y.o.male  residing at [facility], skilled care.  I am here to review his chronic medical issues. He has a hx of CVA with left sided hemiparesis.    Cerebrovascular dz: left sided hemiparesis: no longer receives lipid monitoring due to goals of care and lack of benefit. BP remains controlled with lopressor 75 mg bid  Lab Results  Component Value Date   BUN 22 (A) 01/09/2017   Lab Results  Component Value Date   CREATININE 1.3 01/09/2017    Bipolar: no reports of depression or manic state. He can become restless at times and request to "go home" and needs to be redirected  He has lost 5 lbs since Sept Wt Readings from Last 3 Encounters:  04/03/17 170 lb 6.4 oz (77.3 kg)  02/28/17 174 lb (78.9 kg)  02/06/17 175 lb 3.2 oz (79.5 kg)   DM II: A1C 6.6 in June  of 2018.   Eye exam on 07/06/16. CBGS AM 118-132, lunch 138-209 dinner 143-198. If CBGs are greater than 200 he gets 5 units of Novolog which he has not needed  Mixed AD/Vascular dementia: MMSE 12/30 on 12/22/15, 08/16/16 MMSE 16/30, on Aricept.   Functional status: hoyer lift, incontinent   Past Medical History:  Diagnosis Date  . Anemia, iron deficiency 12/30/2012  . Basal cell carcinoma   . Bipolar 1 disorder ([place])   . CKD (chronic kidney disease), stage II 07/09/2012   07/16/12:   stage II chronic kidney disease by GFR.  Follow lab at interval    . Dementia   . Diabetes ([place])   . GI bleed   . Hypertension   . 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 Pt. is aware of deficit. Will benefit from [place] environment for assistance, supervision, socialization    07/16/12:  Admission MDS reviewed: BIMS score 13/15,  functional status: Pt requires limited to extensive assist with all ADLs except eating (supervision)    . Pressure ulcer of sacrum 12/07/2012  . Squamous cell carcinoma of skin of scalp 01/24/14   anterior and posterior scalp   . Stroke [facility]) 08/2009   Left side with residual deficit   Past Surgical History:  Procedure Laterality Date  . SKIN LESION EXCISION  01/24/14   Anterior and posterio scalp Squamous cell [name] PA  No Known Allergies   Review of Systems  Constitutional: Positive for weight loss (5 lbs). Negative for chills, diaphoresis, fever and malaise/fatigue.  Respiratory: Negative for cough, hemoptysis, sputum production and wheezing.   Cardiovascular: Positive for leg swelling. Negative for chest pain and PND.  Gastrointestinal: Negative for abdominal pain, constipation, diarrhea, nausea and vomiting.  Genitourinary: Negative for dysuria and urgency.  Musculoskeletal: Negative for back pain, falls, joint pain, myalgias and neck pain.  Skin: Negative for itching and rash.    Neurological: Positive for focal weakness. Negative for dizziness, tingling, tremors, sensory change, speech change, seizures, loss of consciousness, weakness and headaches.  Psychiatric/Behavioral: Positive for memory loss. Negative for depression. The patient is nervous/anxious. The patient does not have insomnia.     Current Outpatient Medications on File Prior to Visit  Medication Sig Dispense Refill  . docusate sodium (COLACE) 100 MG capsule Take 100 mg by mouth daily.    Marland Kitchen acetaminophen (TYLENOL) 325 MG tablet Take 650 mg by mouth every 6 (six) hours as needed for pain.    Marland Kitchen antiseptic oral rinse (BIOTENE) LIQD 15 mLs by Mouth Rinse route 4 (four) times daily.    Marland Kitchen aspirin 81 MG tablet Take 81 mg by mouth daily.    . cholecalciferol (VITAMIN D) 1000 UNITS tablet Take 1,000 Units by mouth every morning.    . clomiPRAMINE (ANAFRANIL) 25 MG capsule Take 1 capsule (25 mg total) by mouth at bedtime. 30 capsule 5  . donepezil (ARICEPT) 10 MG tablet Take 10 mg by mouth daily.    . Insulin Detemir (LEVEMIR FLEXTOUCH) 100 UNIT/ML Pen Inject 8 Units into the skin 2 (two) times daily. Hold for CBG <85     . insulin lispro (HUMALOG KWIKPEN) 100 UNIT/ML KiwkPen Check CBG prior to each meal. Give 5 units SQ for CBG > 200.(Ensure resident eats greater than 50% of meals when Humalog given.    Marland Kitchen ketoconazole (NIZORAL) 2 % shampoo Apply 1 application topically 2 (two) times a week.    . latanoprost (XALATAN) 0.005 % ophthalmic solution 1 drop at bedtime.    . metoprolol tartrate (LOPRESSOR) 25 MG tablet Take 75 mg by mouth 2 (two) times daily.     . Multiple Vitamins-Minerals (CENTRUM SILVER 50+MEN) TABS Take by mouth daily.    . QUEtiapine (SEROQUEL) 25 MG tablet Take 12.5 mg by mouth at bedtime.     . tamsulosin (FLOMAX) 0.4 MG CAPS Take 0.4 mg by mouth daily after breakfast.     No current facility-administered medications on file prior to visit.      Immunization History  Administered Date(s)  Administered  . Influenza Inj Mdck Quad Pf 03/17/2016  . Influenza Whole 03/13/2013  . Influenza-Unspecified 03/04/2014, 03/12/2015  . PPD Test 07/05/2012  . Pneumococcal Conjugate-13 07/01/2016  . Td 07/01/2016   Pertinent  Health Maintenance Due  Topic Date Due  . INFLUENZA VACCINE  12/28/2016  . URINE MICROALBUMIN  01/04/2017  . HEMOGLOBIN A1C  05/20/2017  . PNA vac Low Risk Adult (2 of 2 - PPSV23) 07/01/2017  . OPHTHALMOLOGY EXAM  07/06/2017  . FOOT EXAM  08/25/2017   Fall Risk  09/28/2016 05/29/2015 03/16/2015 02/05/2015 10/20/2014  Falls in the past year? No No No No No  Risk for fall due to : - History of fall(s) - - -   Functional Status Survey:    Vitals:   04/03/17 1625  BP: 122/64  Pulse: 68  Resp: 16  Temp: 98.6 F (37 C)  SpO2: 99%  Weight: 170 lb 6.4 oz (77.3 kg)   Physical Exam  Constitutional: No distress.  HENT:  Head: Normocephalic and atraumatic.  Nose: Nose normal.  Mouth/Throat: Oropharynx is clear and moist. No oropharyngeal exudate.  Neck: Normal range of motion. Neck supple. No JVD present. No tracheal deviation present. No thyromegaly present.  Cardiovascular: Normal rate and regular rhythm.  No murmur heard. Pulmonary/Chest: Effort normal and breath sounds normal. No respiratory distress. He has no wheezes.  Abdominal: Soft. Bowel sounds are normal. He exhibits no distension. There is no tenderness.  Lymphadenopathy:    He has no cervical adenopathy.  Neurological: He is alert. No cranial nerve deficit.  Oriented to self, place, not time or situation. Left sided weakness  Skin: Skin is warm and dry. He is not diaphoretic.  Psychiatric: He has a normal mood and affect.    Labs reviewed: Recent Labs    07/01/16 01/09/17  NA 144 145  K 4.6 4.2  BUN 29* 22*  CREATININE 1.3 1.3   No results for input(s): AST, ALT, ALKPHOS, BILITOT, PROT, ALBUMIN in the last 8760 hours. Recent Labs    07/01/16 01/09/17  WBC 8.2 8.1  HGB 11.3* 11.6*  HCT  33* 36*  PLT 168 187   Lab Results  Component Value Date   TSH 1.13 12/22/2015   Lab Results  Component Value Date   HGBA1C 6.6 11/18/2016   Lab Results  Component Value Date   CHOL 152 06/27/2013   HDL 55 06/27/2013   LDLCALC 78 06/27/2013   TRIG 83 06/27/2013    Significant Diagnostic Results in last 30 days:  No results found.  Assessment/Plan HTN (hypertension) Controlled. Continue lopressor 75 mg bid  Mixed Alzheimer's and vascular dementia Progressive cognitive and functional losses. Remains able to participate with activities and propel in the [place]. Continue Aricept.  Diabetes mellitus with renal manifestation CBG decrease BID Decrease meal coverage to BID Continue Levemir 8 units bid  CKD (chronic kidney disease) stage 3, GFR 30-59 ml/min Continue to periodically monitor BMP and avoid nephrotoxic agents   Bipolar disorder Continue seroquel 12.5 mg qd and Anafranil 25 mg qd  Late effects of cerebrovascular disease Continue baby aspirin daily. BP controlled. No lipid monitoring.   Vitamin D deficiency Continue Vit D 1000 units qd   Family/ staff Communication: discussed with staff  Labs: A1C due next  month  Cindi Carbon, [place] [phone]

## 2017-04-03 ENCOUNTER — Encounter: Payer: Self-pay | Admitting: Adult Health

## 2017-04-03 NOTE — Assessment & Plan Note (Signed)
Progressive cognitive and functional losses. Remains able to participate with activities and propel in the [place]. Continue Aricept.

## 2017-04-03 NOTE — Assessment & Plan Note (Signed)
Continue to periodically monitor BMP and avoid nephrotoxic agents

## 2017-04-03 NOTE — Assessment & Plan Note (Signed)
CBG decrease BID Decrease meal coverage to BID Continue Levemir 8 units bid

## 2017-04-03 NOTE — Assessment & Plan Note (Signed)
Continue baby aspirin daily. BP controlled. No lipid monitoring.

## 2017-04-03 NOTE — Assessment & Plan Note (Signed)
Continue Vit D 1000 units qd

## 2017-04-03 NOTE — Assessment & Plan Note (Signed)
Controlled. Continue lopressor 75 mg bid

## 2017-04-03 NOTE — Assessment & Plan Note (Signed)
Continue seroquel 12.5 mg qd and Anafranil 25 mg qd

## 2017-04-28 ENCOUNTER — Non-Acute Institutional Stay (SKILLED_NURSING_FACILITY): Payer: Medicare Other | Admitting: Adult Health

## 2017-04-28 DIAGNOSIS — F3178 Bipolar disorder, in full remission, most recent episode mixed: Secondary | ICD-10-CM | POA: Diagnosis not present

## 2017-04-28 DIAGNOSIS — J301 Allergic rhinitis due to pollen: Secondary | ICD-10-CM

## 2017-04-28 DIAGNOSIS — N183 Chronic kidney disease, stage 3 unspecified: Secondary | ICD-10-CM

## 2017-04-28 DIAGNOSIS — F0150 Vascular dementia without behavioral disturbance: Secondary | ICD-10-CM

## 2017-04-28 DIAGNOSIS — I10 Essential (primary) hypertension: Secondary | ICD-10-CM

## 2017-04-28 DIAGNOSIS — N182 Chronic kidney disease, stage 2 (mild): Secondary | ICD-10-CM | POA: Diagnosis not present

## 2017-04-28 DIAGNOSIS — E1122 Type 2 diabetes mellitus with diabetic chronic kidney disease: Secondary | ICD-10-CM | POA: Diagnosis not present

## 2017-04-28 DIAGNOSIS — F0280 Dementia in other diseases classified elsewhere without behavioral disturbance: Secondary | ICD-10-CM

## 2017-04-28 DIAGNOSIS — R509 Fever, unspecified: Secondary | ICD-10-CM

## 2017-04-28 DIAGNOSIS — Z794 Long term (current) use of insulin: Secondary | ICD-10-CM | POA: Diagnosis not present

## 2017-04-28 DIAGNOSIS — G309 Alzheimer's disease, unspecified: Secondary | ICD-10-CM | POA: Diagnosis not present

## 2017-04-28 NOTE — Progress Notes (Signed)
Location:  Occupational psychologist of Service:  SNF (31) Provider:   Cindi Carbon, ANP [employer] [phone]   Gayland Curry, DO  Patient Care Team: Gayland Curry, DO as PCP - General (Geriatric Medicine) Fay Records, Well [name]  Extended Emergency Contact Information Primary Emergency Contact: [facility] Address: [address]          [place], [place] [address] [name] of [place] Phone: [phone] Mobile Phone: [phone] Relation: Daughter  Code Status:  DNR Goals of care: Advanced Directive information Advanced Directives 02/28/2017  Does Patient Have a Medical Advance Directive? Yes  Type of Paramedic of [place];Living will;Out of facility DNR (pink MOST or yellow form)  Does patient want to make changes to medical advance directive? -  Copy of [place] in Chart? Yes  Pre-existing out of facility DNR order (yellow form or pink MOST form) Yellow form placed in chart (order not valid for inpatient use)     Chief Complaint  Patient presents with  . Acute Visit    low grade temp  . Medical Management of Chronic Issues    HPI:  Pt is a 83 y.o. male seen today for low grade temp and slight increase in confusion, as well as medical management of chronic diseases.    He has one low grade temp of 100.1 on 11/29.  Now his temp is 97.8.  His sats were WNL with no other s/s of infection such as dysuria, frequency, congestion, etc. He did have a slight dry cough per the nurse witnessed one time.   DMII: CBGs range 118-188 at breakfast and 120-144 at supper Currently on meal coverage but rarely needs it.Also takes levemir.  CKD: BUN/CR 22/1.3 which have remained fairly stable  Bipolar: current on seroquel and anafranil and has not tolerated dose reductions  Mixed Alz/Vascular dementia: He is having periods of increased confusion with symptoms such as asking to go  home, wanting to go to lunch at inappropriate times, and appears anxious at times. This was worse last night but seems better during the day today.  Past Medical History:  Diagnosis Date  . Anemia, iron deficiency 12/30/2012  . Basal cell carcinoma   . Bipolar 1 disorder ([place])   . CKD (chronic kidney disease), stage II 07/09/2012   07/16/12:   stage II chronic kidney disease by GFR.  Follow lab at interval    . Dementia   . Diabetes ([place])   . GI bleed   . Hypertension   . 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 Pt. is aware of deficit. Will benefit from [place] environment for assistance, supervision, socialization    07/16/12:  Admission MDS reviewed: BIMS score 13/15,  functional status: Pt requires limited to extensive assist with all ADLs except eating (supervision)    . Pressure ulcer of sacrum 12/07/2012  . Squamous cell carcinoma of skin of scalp 01/24/14   anterior and posterior scalp   . Stroke [facility]) 08/2009   Left side with residual deficit   Past Surgical History:  Procedure Laterality Date  . HIP ARTHROPLASTY Left 12/01/2012   Procedure: ARTHROPLASTY BIPOLAR HIP;  Surgeon: Johnn Hai, MD;  Location: [facility];  Service: Orthopedics;  Laterality: Left;  . SKIN LESION EXCISION  01/24/14   Anterior and posterio scalp Squamous cell [name] PA    No Known Allergies  Outpatient Encounter  Medications as of 04/28/2017  Medication Sig  . acetaminophen (TYLENOL) 325 MG tablet Take 650 mg by mouth every 6 (six) hours as needed for pain.  Marland Kitchen antiseptic oral rinse (BIOTENE) LIQD 15 mLs by Mouth Rinse route 4 (four) times daily.  Marland Kitchen aspirin 81 MG tablet Take 81 mg by mouth daily.  . cholecalciferol (VITAMIN D) 1000 UNITS tablet Take 1,000 Units by mouth every morning.  . clomiPRAMINE (ANAFRANIL) 25 MG capsule Take 1 capsule (25 mg total) by mouth at bedtime.  . docusate sodium (COLACE) 100 MG capsule Take 100 mg  by mouth daily.  Marland Kitchen donepezil (ARICEPT) 10 MG tablet Take 10 mg by mouth daily.  . Insulin Detemir (LEVEMIR FLEXTOUCH) 100 UNIT/ML Pen Inject 8 Units into the skin 2 (two) times daily. Hold for CBG <85   . insulin lispro (HUMALOG KWIKPEN) 100 UNIT/ML KiwkPen 2 (two) times daily. Check CBG prior to each meal. Give 5 units SQ for CBG > 200.(Ensure resident eats greater than 50% of meals when Humalog given.   Marland Kitchen ketoconazole (NIZORAL) 2 % shampoo Apply 1 application topically 2 (two) times a week.  . latanoprost (XALATAN) 0.005 % ophthalmic solution 1 drop at bedtime.  . metoprolol tartrate (LOPRESSOR) 25 MG tablet Take 75 mg by mouth 2 (two) times daily.   . Multiple Vitamins-Minerals (CENTRUM SILVER 50+MEN) TABS Take by mouth daily.  . QUEtiapine (SEROQUEL) 25 MG tablet Take 12.5 mg by mouth at bedtime.   . tamsulosin (FLOMAX) 0.4 MG CAPS Take 0.4 mg by mouth daily after breakfast.   No facility-administered encounter medications on file as of 04/28/2017.     Review of Systems  Constitutional: Positive for fever (one time). Negative for activity change, appetite change, chills, diaphoresis, fatigue and unexpected weight change.  HENT: Negative for trouble swallowing.   Respiratory: Negative for cough, shortness of breath, wheezing and stridor.   Cardiovascular: Positive for leg swelling (left side of body due disuse). Negative for chest pain and palpitations.  Gastrointestinal: Negative for abdominal distention, abdominal pain, constipation and diarrhea.  Genitourinary: Negative for difficulty urinating and dysuria.  Musculoskeletal: Positive for gait problem. Negative for arthralgias, back pain, joint swelling and myalgias.  Neurological: Positive for weakness. Negative for dizziness, seizures, syncope, facial asymmetry, speech difficulty and headaches.  Hematological: Negative for adenopathy. Does not bruise/bleed easily.  Psychiatric/Behavioral: Positive for agitation and confusion. Negative  for behavioral problems, decreased concentration, dysphoric mood, sleep disturbance and suicidal ideas. The patient is nervous/anxious. The patient is not hyperactive.     Immunization History  Administered Date(s) Administered  . Influenza Inj Mdck Quad Pf 03/17/2016  . Influenza Whole 03/13/2013  . Influenza-Unspecified 03/04/2014, 03/12/2015  . PPD Test 07/05/2012  . Pneumococcal Conjugate-13 07/01/2016  . Td 07/01/2016   Pertinent  Health Maintenance Due  Topic Date Due  . INFLUENZA VACCINE  12/28/2016  . URINE MICROALBUMIN  01/04/2017  . HEMOGLOBIN A1C  05/20/2017  . PNA vac Low Risk Adult (2 of 2 - PPSV23) 07/01/2017  . OPHTHALMOLOGY EXAM  07/06/2017  . FOOT EXAM  08/25/2017   Fall Risk  09/28/2016 05/29/2015 03/16/2015 02/05/2015 10/20/2014  Falls in the past year? No No No No No  Risk for fall due to : - History of fall(s) - - -   Functional Status Survey:    Vitals:   05/01/17 1601  BP: 124/80  Pulse: 95  Resp: (!) 24  Temp: 97.8 F (36.6 C)  SpO2: 99%  Weight: 170 lb 6.4 oz (77.3  kg)   There is no height or weight on file to calculate BMI.  Wt Readings from Last 3 Encounters:  05/02/17 165 lb (74.8 kg)  05/01/17 170 lb 6.4 oz (77.3 kg)  04/03/17 170 lb 6.4 oz (77.3 kg)    Physical Exam  Constitutional: No distress.  HENT:  Head: Normocephalic and atraumatic.  Nose: Nose normal.  Mouth/Throat: Oropharynx is clear and moist. No oropharyngeal exudate.  Eyes: Conjunctivae are normal. Pupils are equal, round, and reactive to light. Right eye exhibits no discharge. Left eye exhibits no discharge.  Neck: Normal range of motion. Neck supple. No JVD present. No tracheal deviation present. No thyromegaly present.  Cardiovascular: Normal rate and regular rhythm.  No murmur heard. Pulmonary/Chest: Effort normal. No respiratory distress. He has no wheezes.  Decreased bases  Abdominal: Soft. Bowel sounds are normal. He exhibits no distension. There is no tenderness.    Musculoskeletal:  Left sided hemiplegia  Lymphadenopathy:    He has no cervical adenopathy.  Neurological: He is alert. No cranial nerve deficit.  Oriented to self and place, no time  Skin: Skin is warm and dry. He is not diaphoretic.  Psychiatric: He has a normal mood and affect.  Vitals reviewed.   Labs reviewed: Recent Labs    07/01/16 01/09/17  NA 144 145  K 4.6 4.2  BUN 29* 22*  CREATININE 1.3 1.3   No results for input(s): AST, ALT, ALKPHOS, BILITOT, PROT, ALBUMIN in the last 8760 hours. Recent Labs    07/01/16 01/09/17  WBC 8.2 8.1  HGB 11.3* 11.6*  HCT 33* 36*  PLT 168 187   Lab Results  Component Value Date   TSH 1.13 12/22/2015   Lab Results  Component Value Date   HGBA1C 6.6 11/18/2016   Lab Results  Component Value Date   CHOL 152 06/27/2013   HDL 55 06/27/2013   LDLCALC 78 06/27/2013   TRIG 83 06/27/2013    Significant Diagnostic Results in last 30 days:  No results found.  Assessment/Plan  1. Fever, unspecified fever cause One low grade temp of 100.1 with slight increased confusion which resolved for my visit. I will ask the staff to monitor his VS qshift through the weekend, along with his mentation and report any issues. No signs of acute infection on exam at this time.   2. Type 2 diabetes mellitus with stage 2 chronic kidney disease, with long-term current use of insulin (HCC) His blood sugars are improving D/C humalog meal coverage Change CBGS to BID three times weekly A1C ordered  3. CKD (chronic kidney disease) stage 3, GFR 30-59 ml/min (HCC) Continue to periodically monitor BMP and avoid nephrotoxic agents  4. Essential hypertension Controlled, continue current medication  5. Mixed Alzheimer's and vascular dementia Progressive decline in cognition with worsening periods of confusion  Continues on aricept, I don't think Namenda would help at this point  6. Bipolar disorder, in full remission, most recent episode mixed ([place]) He  is having periods of increased confusion, they do not appear to be related to this diagnosis but this will need more monitoring to tease out symptoms of Bipolar from his underlying dementia    Family/ staff Communication: discussed with nurse  Labs/tests ordered:  A1C on 12/3

## 2017-05-01 ENCOUNTER — Encounter: Payer: Self-pay | Admitting: Adult Health

## 2017-05-02 ENCOUNTER — Encounter: Payer: Self-pay | Admitting: Internal Medicine

## 2017-05-02 ENCOUNTER — Non-Acute Institutional Stay (SKILLED_NURSING_FACILITY): Payer: Medicare Other | Admitting: Internal Medicine

## 2017-05-02 DIAGNOSIS — R9389 Abnormal findings on diagnostic imaging of other specified body structures: Secondary | ICD-10-CM

## 2017-05-02 DIAGNOSIS — F0150 Vascular dementia without behavioral disturbance: Secondary | ICD-10-CM

## 2017-05-02 DIAGNOSIS — F0280 Dementia in other diseases classified elsewhere without behavioral disturbance: Secondary | ICD-10-CM | POA: Diagnosis not present

## 2017-05-02 DIAGNOSIS — G309 Alzheimer's disease, unspecified: Secondary | ICD-10-CM | POA: Diagnosis not present

## 2017-05-02 DIAGNOSIS — I69391 Dysphagia following cerebral infarction: Secondary | ICD-10-CM | POA: Diagnosis not present

## 2017-05-02 DIAGNOSIS — R509 Fever, unspecified: Secondary | ICD-10-CM | POA: Diagnosis not present

## 2017-05-02 NOTE — Progress Notes (Signed)
Patient ID: George Kemp, male   DOB: [birth year], 83 y.o.   MRN: [id number]  Location:  [place] Room Number: 564 Place of Service:  SNF [phone]) Provider:   Gayland Curry, DO  Patient Care Team: Gayland Curry, DO as PCP - General (Geriatric Medicine) Community, Well [name]  Extended Emergency Contact Information Primary Emergency Contact: [facility] Address: [address]          [place], [place] [address] [name] Phone: [phone] Mobile Phone: [phone] Relation: Daughter  Code Status:  DNR Goals of care: Advanced Directive information Advanced Directives 05/02/2017  Does Patient Have a Medical Advance Directive? Yes  Type of Advance Directive Out of facility DNR (pink MOST or yellow form);[place];Living will  Does patient want to make changes to medical advance directive? No - Patient declined  Copy of [place] in Chart? Yes  Pre-existing out of facility DNR order (yellow form or pink MOST form) Yellow form placed in chart (order not valid for inpatient use)     Chief Complaint  Patient presents with  . Acute Visit    increased confusion, decreased alertness    HPI:  Pt is a 83 y.o. male with h/o vascular dementia, prior stroke with hemiparesis, bipolar disorder, htn, DMII with renal manifestations, CKD3,  and anemia seen today for an acute visit for increased confusion, decreased alertness, low grade temperature off and on since yesterday.  He even had a low grade temp as far back as 11/30 when NP saw him and she noted some dry cough, but normal sats and resolution of the temp when seen.  On call was contacted last night and I/O cath UA sent off which was negative and CXR done with small right lung infiltrate vs atelectasis.  He now has no cough at present.  His sats are still normal at 98%.  He was quite confused and delusional.  It seemed he thought he was  at work and he was talking about his boss being really tough and demanding causing a lot of stress.  Bowels are moving.  No loose stools noted.  He remains on aricept for his dementia.  MMSE was 16/30 in March of this year.  He lives in skilled care and requires the hoyer lift and uses depends due to incontinence.  He is continuing to lose weight, now down to 165 from 175 in September.  On Dysphagia 3 diet, chopped meats, heart healthy, no bacon, thin liquids due to dysphagia and aspiration.    DMII:  No hypoglycemia detected.  His CBGs were changed to bid twice a week.  He is on levemir 8 units bid.    Bipolar:  He's back on his seroquel and anafranil b/c he had a decline when they were changed.     Past Medical History:  Diagnosis Date  . Anemia, iron deficiency 12/30/2012  . Basal cell carcinoma   . Bipolar 1 disorder ([place])   . CKD (chronic kidney disease), stage II 07/09/2012   07/16/12:   stage II chronic kidney disease by GFR.  Follow lab at interval    . Dementia   . Diabetes ([place])   . GI bleed   . Hypertension   . 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 Pt. is aware of deficit. Will benefit from [place] environment for assistance, supervision, socialization    07/16/12:  Admission MDS reviewed: BIMS score 13/15,  functional status: Pt requires limited to extensive assist with all ADLs except eating (supervision)    . Pressure ulcer of sacrum 12/07/2012  . Squamous cell carcinoma of skin of scalp 01/24/14   anterior and posterior scalp   . Stroke [facility]) 08/2009   Left side with residual deficit   Past Surgical History:  Procedure Laterality Date  . HIP ARTHROPLASTY Left 12/01/2012   Procedure: ARTHROPLASTY BIPOLAR HIP;  Surgeon: Johnn Hai, MD;  Location: [facility];  Service: Orthopedics;  Laterality: Left;  . SKIN LESION EXCISION  01/24/14   Anterior and posterio scalp Squamous cell [name] PA    No Known  Allergies  Outpatient Encounter Medications as of 05/02/2017  Medication Sig  . acetaminophen (TYLENOL) 325 MG tablet Take 650 mg by mouth every 6 (six) hours as needed for pain.  Marland Kitchen antiseptic oral rinse (BIOTENE) LIQD 15 mLs by Mouth Rinse route 4 (four) times daily.  Marland Kitchen aspirin 81 MG tablet Take 81 mg by mouth daily.  . cholecalciferol (VITAMIN D) 1000 UNITS tablet Take 1,000 Units by mouth every morning.  . clomiPRAMINE (ANAFRANIL) 25 MG capsule Take 1 capsule (25 mg total) by mouth at bedtime.  . docusate sodium (COLACE) 100 MG capsule Take 100 mg by mouth daily.  Marland Kitchen donepezil (ARICEPT) 10 MG tablet Take 10 mg by mouth daily.  . Insulin Detemir (LEVEMIR FLEXTOUCH) 100 UNIT/ML Pen Inject 8 Units into the skin 2 (two) times daily. Hold for CBG <85   . ketoconazole (NIZORAL) 2 % shampoo Apply 1 application topically 2 (two) times a week.  . latanoprost (XALATAN) 0.005 % ophthalmic solution 1 drop at bedtime.  . metoprolol tartrate (LOPRESSOR) 25 MG tablet Take 75 mg by mouth 2 (two) times daily.   . Multiple Vitamins-Minerals (CENTRUM SILVER 50+MEN) TABS Take by mouth daily.  . QUEtiapine (SEROQUEL) 25 MG tablet Take 12.5 mg by mouth at bedtime.   . tamsulosin (FLOMAX) 0.4 MG CAPS Take 0.4 mg by mouth daily after breakfast.  . [DISCONTINUED] insulin lispro (HUMALOG KWIKPEN) 100 UNIT/ML KiwkPen 2 (two) times daily. Check CBG prior to each meal. Give 5 units SQ for CBG > 200.(Ensure resident eats greater than 50% of meals when Humalog given.    No facility-administered encounter medications on file as of 05/02/2017.     Review of Systems  Constitutional: Positive for fever and unexpected weight change. Negative for appetite change and chills.  HENT: Positive for trouble swallowing. Negative for congestion.   Eyes: Negative for visual disturbance.  Respiratory: Negative for cough, chest tightness, shortness of breath and wheezing.   Cardiovascular: Negative for chest pain, palpitations and leg  swelling.  Gastrointestinal: Negative for abdominal pain, blood in stool, constipation, diarrhea, nausea and vomiting.  Genitourinary: Negative for dysuria.  Musculoskeletal: Positive for gait problem. Negative for arthralgias.       Uses manual wheelchair and hoyer lift for transfers  Skin: Negative for color change.  Neurological: Positive for weakness.       Dysarthria mild  Psychiatric/Behavioral: Positive for confusion and decreased concentration. Negative for agitation, behavioral problems, hallucinations, sleep disturbance and suicidal ideas.       Delusional thoughts    Immunization History  Administered Date(s) Administered  . Influenza Inj Mdck Quad Pf 03/17/2016  . Influenza Whole 03/13/2013  . Influenza-Unspecified 03/04/2014, 03/12/2015  . PPD Test 07/05/2012  . Pneumococcal Conjugate-13 07/01/2016  . Td 07/01/2016   Pertinent  Health Maintenance Due  Topic Date Due  . INFLUENZA VACCINE  12/28/2016  . URINE MICROALBUMIN  01/04/2017  . HEMOGLOBIN A1C  05/20/2017  . PNA vac Low Risk Adult (2 of 2 - PPSV23) 07/01/2017  . OPHTHALMOLOGY EXAM  07/06/2017  . FOOT EXAM  08/25/2017   Fall Risk  09/28/2016 05/29/2015 03/16/2015 02/05/2015 10/20/2014  Falls in the past year? No No No No No  Risk for fall due to : - History of fall(s) - - -   Functional Status Survey:    There were no vitals filed for this visit. There is no height or weight on file to calculate BMI. Physical Exam  Constitutional: No distress.  Thin white male resting in bed  Cardiovascular: Normal rate, regular rhythm, normal heart sounds and intact distal pulses.  Pulmonary/Chest: Effort normal and breath sounds normal. No respiratory distress. He has no wheezes. He has no rales.  Abdominal: Soft. Bowel sounds are normal. He exhibits no distension. There is no tenderness.  Musculoskeletal:  hemiparesis  Neurological: He is alert.  Delusional (see hpi)  Skin: Skin is warm and dry.  Psychiatric:  Very  pleasant, not agitated, but says he is "stressed"    Labs reviewed: Recent Labs    07/01/16 01/09/17  NA 144 145  K 4.6 4.2  BUN 29* 22*  CREATININE 1.3 1.3   No results for input(s): AST, ALT, ALKPHOS, BILITOT, PROT, ALBUMIN in the last 8760 hours. Recent Labs    07/01/16 01/09/17  WBC 8.2 8.1  HGB 11.3* 11.6*  HCT 33* 36*  PLT 168 187   Lab Results  Component Value Date   TSH 1.13 12/22/2015   Lab Results  Component Value Date   HGBA1C 6.6 11/18/2016   Lab Results  Component Value Date   CHOL 152 06/27/2013   HDL 55 06/27/2013   LDLCALC 78 06/27/2013   TRIG 83 06/27/2013    Significant Diagnostic Results in last 30 days:  pCXR revewed  Assessment/Plan 1. Low grade fever -suspect some aspiration -he's losing weight which tends to go with more dysphagia and aspiration as dementia progresses -he's only had minimal cough -UA was negative -add cbc, bmp to r/o leukocytosis, electrolyte abnormality or renal failure as cause of his declining mental status  -may be progression of dementia (but that does not explain intermittent temp)--no evidence of edema of extremity to suggest dvt -could be atelectasis also causing since xray not clear of infiltrate vs atelectasis--encourage incentive spirometry  2. Abnormal chest x-ray -suspect some aspiration -he's losing weight which tends to go with more dysphagia and aspiration as dementia progresses -he's only had minimal cough -could be atelectasis also causing since xray not clear of infiltrate vs atelectasis--encourage incentive spirometry (as able) -will not treat with abx unless wbc elevated  3. Dysphagia as late effect of cerebrovascular accident (CVA) -may be worsening, if no lab abnormalities, would recommend revisiting ST  4. Mixed Alzheimer's and vascular dementia -progressing, suspect this is primary issue here, but rule out acute delirium on dementia first with additional labs ordered  Family/ staff  Communication: discussed with snf nurse  Labs/tests ordered:  Cbc, bmp  Tityana Pagan L. Javonna Balli, D.O. [facility] [address], [place] [address] Cell Phone (Mon-Fri 8am-5pm):  [phone] On Call:  [phone] & follow prompts after 5pm & weekends Office Phone:  [phone] Office Fax:  [phone]

## 2017-05-03 LAB — HEMOGLOBIN A1C: Hemoglobin A1C: 6.3

## 2017-05-03 LAB — HEPATIC FUNCTION PANEL
ALT: 11 (ref 10–40)
AST: 19 (ref 14–40)
Alkaline Phosphatase: 74 (ref 25–125)
BILIRUBIN, TOTAL: 0.2

## 2017-05-03 LAB — BASIC METABOLIC PANEL
BUN: 22 — AB (ref 4–21)
Creatinine: 1.2 (ref 0.6–1.3)
GLUCOSE: 103
Potassium: 4.4 (ref 3.4–5.3)
SODIUM: 142 (ref 137–147)

## 2017-05-03 LAB — CBC AND DIFFERENTIAL
HEMATOCRIT: 32 — AB (ref 41–53)
Hemoglobin: 10.2 — AB (ref 13.5–17.5)
PLATELETS: 219 (ref 150–399)
WBC: 7.5

## 2017-06-01 ENCOUNTER — Non-Acute Institutional Stay (SKILLED_NURSING_FACILITY): Payer: Medicare Other | Admitting: Adult Health

## 2017-06-01 ENCOUNTER — Encounter: Payer: Self-pay | Admitting: Adult Health

## 2017-06-01 DIAGNOSIS — R634 Abnormal weight loss: Secondary | ICD-10-CM

## 2017-06-01 DIAGNOSIS — N400 Enlarged prostate without lower urinary tract symptoms: Secondary | ICD-10-CM

## 2017-06-01 DIAGNOSIS — F3177 Bipolar disorder, in partial remission, most recent episode mixed: Secondary | ICD-10-CM | POA: Diagnosis not present

## 2017-06-01 DIAGNOSIS — I10 Essential (primary) hypertension: Secondary | ICD-10-CM | POA: Diagnosis not present

## 2017-06-01 DIAGNOSIS — R3981 Functional urinary incontinence: Secondary | ICD-10-CM | POA: Diagnosis not present

## 2017-06-01 DIAGNOSIS — K5901 Slow transit constipation: Secondary | ICD-10-CM | POA: Diagnosis not present

## 2017-06-01 DIAGNOSIS — F0150 Vascular dementia without behavioral disturbance: Secondary | ICD-10-CM

## 2017-06-01 DIAGNOSIS — N183 Chronic kidney disease, stage 3 unspecified: Secondary | ICD-10-CM

## 2017-06-01 DIAGNOSIS — G309 Alzheimer's disease, unspecified: Secondary | ICD-10-CM

## 2017-06-01 DIAGNOSIS — E1122 Type 2 diabetes mellitus with diabetic chronic kidney disease: Secondary | ICD-10-CM

## 2017-06-01 DIAGNOSIS — D638 Anemia in other chronic diseases classified elsewhere: Secondary | ICD-10-CM

## 2017-06-01 DIAGNOSIS — I6990 Unspecified sequelae of unspecified cerebrovascular disease: Secondary | ICD-10-CM

## 2017-06-01 DIAGNOSIS — Z794 Long term (current) use of insulin: Secondary | ICD-10-CM

## 2017-06-01 DIAGNOSIS — F0280 Dementia in other diseases classified elsewhere without behavioral disturbance: Secondary | ICD-10-CM

## 2017-06-01 DIAGNOSIS — N182 Chronic kidney disease, stage 2 (mild): Secondary | ICD-10-CM

## 2017-06-01 LAB — HM DIABETES FOOT EXAM

## 2017-06-01 NOTE — Assessment & Plan Note (Signed)
Controlled, continue lopressor Goal BP <150/90

## 2017-06-01 NOTE — Assessment & Plan Note (Signed)
Urine micro ordered Diabetic eye exam due next month Continue Levemir 8 units BID

## 2017-06-01 NOTE — Progress Notes (Signed)
Provider:   Cindi Carbon, ANP [place] [phone]  Location: [place] of Service:  SNF (31)   PCP: Gayland Curry, DO Patient Care Team: Gayland Curry, DO as PCP - General (Geriatric Medicine) Community, [name]  Extended Emergency Contact Information Primary Emergency Contact: [facility] Address: [address]          [place], [place] [address] [name] of [place] Phone: [phone] Mobile Phone: [phone] Relation: Daughter  Code Status: DNR Goals of Care: Advanced Directive information Advanced Directives 05/02/2017  Does Patient Have a Medical Advance Directive? Yes  Type of Advance Directive Out of facility DNR (pink MOST or yellow form);[place];Living will  Does patient want to make changes to medical advance directive? No - Patient declined  Copy of [place] in Chart? Yes  Pre-existing out of facility DNR order (yellow form or pink MOST form) Yellow form placed in chart (order not valid for inpatient use)     Chief Complaint  Patient presents with  . Annual Exam    HPI: Patient is a 84 y.o. male seen today for an annual comprehensive examination. There are no complaints reported from the staff or resident.  Mixed Alz and vascular dementia: staff report increased confusion over time and sleeping more.  He remains on Aricept. He continues to participate in activities but is less involved over time as well  Bipolar: he has failed trials off seroquel and Anafranil.  Appears slightly sad to the nurse but no actual verbalizations of this or tearful episodes  Losing weight: eating less in the dining room Wt Readings from Last 3 Encounters:  06/01/17 163 lb 12.8 oz (74.3 kg)  05/02/17 165 lb (74.8 kg)  05/01/17 170 lb 6.4 oz (77.3 kg)   HTN: controlled  Hx of TIA on aspirin, failed plavix trial  DM II: diabetic foot exam completed  today Needs urine micro Lab Results  Component Value Date   HGBA1C 6.3 05/03/2017   CBGS 148-159 in the am and 154-245 in the pm   CKD Cr Cl 44.5 ml/min:  Lab Results  Component Value Date   BUN 22 (A) 05/03/2017   Lab Results  Component Value Date   CREATININE 1.2 05/03/2017   Constipation: on colace with regular BMs  BPH: denies issues with urination with flomax  Anemia: decreased from 11.6 in August to 10.2 Lab Results  Component Value Date   HGB 10.2 (A) 05/03/2017     Functional status: incontinent and hoyer lift  Past Medical History:  Diagnosis Date  . Anemia, iron deficiency 12/30/2012  . Basal cell carcinoma   . Bipolar 1 disorder ([place])   . CKD (chronic kidney disease), stage II 07/09/2012   07/16/12:   stage II chronic kidney disease by GFR.  Follow lab at interval    . Dementia   . Diabetes ([place])   . GI bleed   . Hypertension   . 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 Pt. is aware of deficit. Will benefit from [place] environment for assistance, supervision, socialization    07/16/12:  Admission MDS reviewed: BIMS score 13/15,  functional status: Pt requires limited to extensive assist with all ADLs except eating (supervision)    . Pressure ulcer of sacrum 12/07/2012  . Squamous cell carcinoma of skin of scalp 01/24/14   anterior and posterior scalp   . Stroke (  [place]) 08/2009   Left side with residual deficit   Past Surgical History:  Procedure Laterality Date  . HIP ARTHROPLASTY Left 12/01/2012   Procedure: ARTHROPLASTY BIPOLAR HIP;  Surgeon: Johnn Hai, MD;  Location: [facility];  Service: Orthopedics;  Laterality: Left;  . SKIN LESION EXCISION  01/24/14   Anterior and posterio scalp Squamous cell [name] PA    reports that he has quit smoking. He smoked 1.00 pack per day. he has never used smokeless tobacco. He reports that he does not drink alcohol or use drugs. Social  History   Socioeconomic History  . Marital status: Married    Spouse name: None  . Number of children: None  . Years of education: None  . Highest education level: None  Social Needs  . Financial resource strain: None  . Food insecurity - worry: None  . Food insecurity - inability: None  . Transportation needs - medical: None  . Transportation needs - non-medical: None  Occupational History  . Occupation: retired Engineer, maintenance (IT)  Tobacco Use  . Smoking status: Former Smoker    Packs/day: 1.00  . Smokeless tobacco: Never Used  Substance and Sexual Activity  . Alcohol use: No  . Drug use: No  . Sexual activity: No  Other Topics Concern  . None  Social History Narrative   His wife passed away in September 07, 2015. Resides at [facility], Skilled nursing section since 05/2012.  He is a retired Engineer, maintenance (IT). Stopped smoking many years ago, drinks minimal alcohol. Pt. Has Advanced Directives: DNR.   Family History  Problem Relation Age of Onset  . Stroke Mother   . Congestive Heart Failure Mother   . Cancer - Other Father        Unknown  Type  . Cancer Father   . Stroke Brother         X 2  . Diabetes Brother   . Thyroid disease Daughter     Pertinent  Health Maintenance Due  Topic Date Due  . URINE MICROALBUMIN  01/04/2017  . HEMOGLOBIN A1C  05/20/2017  . PNA vac Low Risk Adult (2 of 2 - PPSV23) 07/01/2017  . OPHTHALMOLOGY EXAM  07/06/2017  . FOOT EXAM  06-Sep-2017  . INFLUENZA VACCINE  Completed   Fall Risk  09/28/2016 05/29/2015 03/16/2015 02/05/2015 10/20/2014  Falls in the past year? No No No No No  Risk for fall due to : - History of fall(s) - - -   Depression screen [facility] 2/9 09/28/2016 05/29/2015 03/16/2015 02/05/2015 10/20/2014  Decreased Interest 0 0 0 0 0  Down, Depressed, Hopeless 0 0 0 0 0  PHQ - 2 Score 0 0 0 0 0    Functional Status Survey:    No Known Allergies  Outpatient Encounter Medications as of 06/01/2017  Medication Sig  . acetaminophen (TYLENOL) 325 MG tablet  Take 650 mg by mouth every 6 (six) hours as needed for pain.  Marland Kitchen antiseptic oral rinse (BIOTENE) LIQD 15 mLs by Mouth Rinse route 4 (four) times daily.  Marland Kitchen aspirin 81 MG tablet Take 81 mg by mouth daily.  . cholecalciferol (VITAMIN D) 1000 UNITS tablet Take 1,000 Units by mouth every morning.  . clomiPRAMINE (ANAFRANIL) 25 MG capsule Take 1 capsule (25 mg total) by mouth at bedtime.  . docusate sodium (COLACE) 100 MG capsule Take 100 mg by mouth daily.  Marland Kitchen donepezil (ARICEPT) 10 MG tablet Take 10 mg by mouth daily.  . Insulin Detemir (LEVEMIR FLEXTOUCH) 100 UNIT/ML  Pen Inject 8 Units into the skin 2 (two) times daily. Hold for CBG <85   . ketoconazole (NIZORAL) 2 % shampoo Apply 1 application topically 2 (two) times a week.  . latanoprost (XALATAN) 0.005 % ophthalmic solution 1 drop at bedtime.  . metoprolol tartrate (LOPRESSOR) 25 MG tablet Take 75 mg by mouth 2 (two) times daily.   . Multiple Vitamins-Minerals (CENTRUM SILVER 50+MEN) TABS Take by mouth daily.  . QUEtiapine (SEROQUEL) 25 MG tablet Take 12.5 mg by mouth at bedtime.   . tamsulosin (FLOMAX) 0.4 MG CAPS Take 0.4 mg by mouth daily after breakfast.   No facility-administered encounter medications on file as of 06/01/2017.     Review of Systems  Constitutional: Negative for activity change, appetite change, chills, diaphoresis, fatigue, fever and unexpected weight change.  HENT: Negative for congestion.   Eyes: Negative for visual disturbance.  Respiratory: Negative for cough, shortness of breath, wheezing and stridor.   Cardiovascular: Negative for chest pain, palpitations and leg swelling.  Gastrointestinal: Negative for abdominal distention, abdominal pain, constipation and diarrhea.  Endocrine: Negative for polyuria.  Genitourinary: Negative for difficulty urinating and dysuria.  Musculoskeletal: Positive for gait problem. Negative for arthralgias, back pain, joint swelling and myalgias.  Neurological: Positive for weakness  (left sided weakness). Negative for dizziness, seizures, syncope, facial asymmetry, speech difficulty and headaches.  Hematological: Negative for adenopathy. Does not bruise/bleed easily.  Psychiatric/Behavioral: Positive for confusion. Negative for agitation, behavioral problems and sleep disturbance. The patient is not nervous/anxious.     Vitals:   06/01/17 1131  BP: 136/80  Pulse: 80  Resp: 20  Temp: 97.6 F (36.4 C)  SpO2: 99%  Weight: 163 lb 12.8 oz (74.3 kg)   Body mass index is 22.85 kg/m.  Wt Readings from Last 3 Encounters:  06/01/17 163 lb 12.8 oz (74.3 kg)  05/02/17 165 lb (74.8 kg)  05/01/17 170 lb 6.4 oz (77.3 kg)   Physical Exam  Constitutional: No distress.  HENT:  Head: Normocephalic and atraumatic.  Right Ear: External ear normal.  Left Ear: External ear normal.  Nose: Nose normal.  Mouth/Throat: Oropharynx is clear and moist. No oropharyngeal exudate.  Eyes: Conjunctivae are normal. Pupils are equal, round, and reactive to light. Right eye exhibits no discharge. Left eye exhibits no discharge.  Difficulty following directions for EOMs   Neck: No JVD present. No tracheal deviation present. No thyromegaly present.  Cardiovascular: Normal rate and regular rhythm.  No murmur heard. Pulmonary/Chest: Effort normal and breath sounds normal. No respiratory distress. He has no wheezes.  Abdominal: Soft. Bowel sounds are normal. He exhibits no distension. There is no tenderness.  Musculoskeletal: He exhibits no edema or tenderness.  Left sided weakness due to previous CVA.  Some rigidity to left shoulder and left elbow  Lymphadenopathy:    He has no cervical adenopathy.  Neurological: He is alert. No cranial nerve deficit.  Oriented to self only  Skin: Skin is warm and dry. He is not diaphoretic.  Psychiatric: He has a normal mood and affect.  Nursing note and vitals reviewed.   Labs reviewed: Basic Metabolic Panel: Recent Labs    07/01/16 01/09/17 05/03/17   NA 144 145 142  K 4.6 4.2 4.4  BUN 29* 22* 22*  CREATININE 1.3 1.3 1.2   Liver Function Tests: Recent Labs    05/03/17  AST 19  ALT 11  ALKPHOS 74   No results for input(s): LIPASE, AMYLASE in the last 8760 hours. No results for  input(s): AMMONIA in the last 8760 hours. CBC: Recent Labs    07/01/16 01/09/17 05/03/17  WBC 8.2 8.1 7.5  HGB 11.3* 11.6* 10.2*  HCT 33* 36* 32*  PLT 168 187 219   Cardiac Enzymes: No results for input(s): CKTOTAL, CKMB, CKMBINDEX, TROPONINI in the last 8760 hours. BNP: Invalid input(s): POCBNP Lab Results  Component Value Date   HGBA1C 6.3 05/03/2017   Lab Results  Component Value Date   TSH 1.13 12/22/2015   No results found for: VITAMINB12 No results found for: FOLATE No results found for: IRON, TIBC, FERRITIN  Imaging and Procedures obtained recently: No results found.  Assessment/Plan  Mixed Alzheimer's and vascular dementia Progressive decline in cognition. Continue supportive care and Aricept as he is still able to feed himself and participate in activities  HTN (hypertension) Controlled, continue lopressor Goal BP <150/90  Diabetes mellitus with renal manifestation Urine micro ordered Diabetic eye exam due next month Continue Levemir 8 units BID   CKD (chronic kidney disease) stage 3, GFR 30-59 ml/min Check urine microalbumin Continue to periodically monitor BMP and avoid nephrotoxic agents   Bipolar disorder Continue seroquel and Anafranil.  Slight depressed mood, will monitor.   Late effects of cerebrovascular disease Hx of CVA, takes baby aspirin qd.  No lipid monitoring due to lack of benefit.  Anemia of chronic disease 1.5 gram drop in 4 month period. No outward sign bleeding. Will continue to monitor for bleeding, CBC , and iron panel in the next few months.   Constipation Continue Colace 100 mg qd  BPH (benign prostatic hyperplasia) Controlled Continue Flomax 0.4 mg qhs  Urinary incontinence due to  immobility In skilled care, staff to provide incontinence care  Weight loss Noted decrease intake which may be related to his mood and progressive dementia Will continue to monitor and add glucerna 1 can qd for decreased intake   Family/ staff Communication: staff  Labs/tests ordered: urine microalbumin

## 2017-06-01 NOTE — Assessment & Plan Note (Signed)
Progressive decline in cognition. Continue supportive care and Aricept as he is still able to feed himself and participate in activities

## 2017-06-02 LAB — MICROALBUMIN, URINE

## 2017-06-05 NOTE — Assessment & Plan Note (Signed)
Continue seroquel and Anafranil.  Slight depressed mood, will monitor.

## 2017-06-05 NOTE — Assessment & Plan Note (Signed)
Continue Colace 100 mg qd

## 2017-06-05 NOTE — Assessment & Plan Note (Signed)
Hx of CVA, takes baby aspirin qd.  No lipid monitoring due to lack of benefit.

## 2017-06-05 NOTE — Assessment & Plan Note (Signed)
Controlled Continue Flomax 0.4 mg qhs

## 2017-06-05 NOTE — Assessment & Plan Note (Signed)
Check urine microalbumin Continue to periodically monitor BMP and avoid nephrotoxic agents

## 2017-06-05 NOTE — Assessment & Plan Note (Signed)
In skilled care, staff to provide incontinence care

## 2017-06-05 NOTE — Assessment & Plan Note (Signed)
1.5 gram drop in 4 month period. No outward sign bleeding. Will continue to monitor for bleeding, CBC , and iron panel in the next few months.

## 2017-07-18 ENCOUNTER — Encounter: Payer: Self-pay | Admitting: Internal Medicine

## 2017-07-18 ENCOUNTER — Non-Acute Institutional Stay (SKILLED_NURSING_FACILITY): Payer: Medicare Other | Admitting: Internal Medicine

## 2017-07-18 DIAGNOSIS — G309 Alzheimer's disease, unspecified: Secondary | ICD-10-CM

## 2017-07-18 DIAGNOSIS — F0280 Dementia in other diseases classified elsewhere without behavioral disturbance: Secondary | ICD-10-CM | POA: Diagnosis not present

## 2017-07-18 DIAGNOSIS — F3175 Bipolar disorder, in partial remission, most recent episode depressed: Secondary | ICD-10-CM

## 2017-07-18 DIAGNOSIS — R627 Adult failure to thrive: Secondary | ICD-10-CM

## 2017-07-18 DIAGNOSIS — N183 Chronic kidney disease, stage 3 unspecified: Secondary | ICD-10-CM

## 2017-07-18 DIAGNOSIS — E1122 Type 2 diabetes mellitus with diabetic chronic kidney disease: Secondary | ICD-10-CM | POA: Diagnosis not present

## 2017-07-18 DIAGNOSIS — Z794 Long term (current) use of insulin: Secondary | ICD-10-CM

## 2017-07-18 DIAGNOSIS — K5901 Slow transit constipation: Secondary | ICD-10-CM

## 2017-07-18 DIAGNOSIS — F0150 Vascular dementia without behavioral disturbance: Secondary | ICD-10-CM | POA: Diagnosis not present

## 2017-07-18 NOTE — Progress Notes (Signed)
Patient ID: George Kemp, male   DOB: 1926/07/01, 84 y.o.   MRN: [id number]  Location:  [place] Room Number: 427 Place of Service:  SNF ([phone]) Provider:  Gayland Curry, DO  Patient Care Team: George Curry, DO as PCP - General (Geriatric Medicine) Community, Well [name]  Extended Emergency Contact Information Primary Emergency Contact: [facility] Address: [address]          [place], [place] [address] [name] of [place] Phone: [phone] Mobile Phone: [phone] Relation: Daughter  Code Status:  DNR Goals of care: Advanced Directive information Advanced Directives 07/18/2017  Does Patient Have a Medical Advance Directive? Yes  Type of Advance Directive Out of facility DNR (pink MOST or yellow form);[place];Living will  Does patient want to make changes to medical advance directive? No - Patient declined  Copy of [place] in Chart? Yes  Pre-existing out of facility DNR order (yellow form or pink MOST form) Yellow form placed in chart (order not valid for inpatient use)   Chief Complaint  Patient presents with  . Medical Management of Chronic Issues    Routine Visit    HPI:  Pt is a 84 y.o. male seen today for medical management of chronic diseases.  He's in long term care s/p CVA with hemiparesis, dysarthria.    Pt had been coughing more, had laryngitis but now sounds better per SNF nurse.  Voice was clear during visit today.  He did have some coughing over breakfast, but it resolved after that.  He'd had difficulty with cough and low grade temp in early December also.  He's on a dysphagia 3 diet with chopped meats, thin liquids due to his dysphagia.  Last month, he was noted to be losing weight (down to 163 from 170lbs a month before  He was felt to appear sad at times, but no overt tearful spells.  He'd been eating less in the dining room.  He's lost 2 more lbs this  month.  His BMI remains within the normal range.  He did not tolerate previous reductions and discontinuations of his anafranil and seroquel.  He remains on aricept.  Dementia continues to progress and it's become much harder to get meaningful information from him about how he feels over the past 6 mos or so.  He has been dependent in adls.    BP well controlled on current regimen.  Bowels are moving with colace.    Past Medical History:  Diagnosis Date  . Anemia, iron deficiency 12/30/2012  . Basal cell carcinoma   . Bipolar 1 disorder ([place])   . CKD (chronic kidney disease), stage II 07/09/2012   07/16/12:   stage II chronic kidney disease by GFR.  Follow lab at interval    . Dementia   . Diabetes ([place])   . GI bleed   . Hypertension   . 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 Pt. is aware of deficit. Will benefit from [place] environment for assistance, supervision, socialization    07/16/12:  Admission MDS reviewed: BIMS score 13/15,  functional status: Pt requires limited to extensive assist with all ADLs except eating (supervision)    . Pressure ulcer of sacrum 12/07/2012  . Squamous cell carcinoma of skin of scalp 01/24/14   anterior and posterior scalp   . Stroke [facility]) 08/2009   Left side with residual deficit  Past Surgical History:  Procedure Laterality Date  . HIP ARTHROPLASTY Left 12/01/2012   Procedure: ARTHROPLASTY BIPOLAR HIP;  Surgeon: Johnn Hai, MD;  Location: [facility];  Service: Orthopedics;  Laterality: Left;  . SKIN LESION EXCISION  01/24/14   Anterior and posterio scalp Squamous cell [name] PA    No Known Allergies  Outpatient Encounter Medications as of 07/18/2017  Medication Sig  . acetaminophen (TYLENOL) 325 MG tablet Take 650 mg by mouth every 6 (six) hours as needed for pain.  Marland Kitchen antiseptic oral rinse (BIOTENE) LIQD 15 mLs by Mouth Rinse route 4 (four) times daily.  Marland Kitchen aspirin 81 MG  tablet Take 81 mg by mouth daily.  . cholecalciferol (VITAMIN D) 1000 UNITS tablet Take 1,000 Units by mouth every morning.  . clomiPRAMINE (ANAFRANIL) 25 MG capsule Take 1 capsule (25 mg total) by mouth at bedtime.  . docusate sodium (COLACE) 100 MG capsule Take 100 mg by mouth daily.  Marland Kitchen donepezil (ARICEPT) 10 MG tablet Take 10 mg by mouth daily.  . Insulin Detemir (LEVEMIR FLEXTOUCH) 100 UNIT/ML Pen Inject 8 Units into the skin 2 (two) times daily. Hold for CBG <85   . ketoconazole (NIZORAL) 2 % shampoo Apply 1 application topically 2 (two) times a week.  . latanoprost (XALATAN) 0.005 % ophthalmic solution 1 drop at bedtime.  . metoprolol tartrate (LOPRESSOR) 25 MG tablet Take 75 mg by mouth 2 (two) times daily.   . Multiple Vitamins-Minerals (CENTRUM SILVER 50+MEN) TABS Take by mouth daily.  . QUEtiapine (SEROQUEL) 25 MG tablet Take 12.5 mg by mouth at bedtime.   . tamsulosin (FLOMAX) 0.4 MG CAPS Take 0.4 mg by mouth daily after breakfast.   No facility-administered encounter medications on file as of 07/18/2017.     Review of Systems  Constitutional: Positive for appetite change. Negative for activity change, chills, fatigue and fever.  HENT: Positive for trouble swallowing. Negative for congestion.   Eyes: Negative for visual disturbance.       Glaucoma, glasses  Respiratory: Positive for cough. Negative for shortness of breath and wheezing.   Cardiovascular: Negative for chest pain, palpitations and leg swelling.  Gastrointestinal: Positive for constipation. Negative for abdominal pain, blood in stool and diarrhea.  Genitourinary: Negative for dysuria.  Musculoskeletal: Negative for joint swelling.       Hemiparesis s/p cva  Skin: Negative for color change.  Neurological: Positive for weakness. Negative for dizziness.       Dysarthria and hemiparesis  Psychiatric/Behavioral: Positive for confusion. Negative for agitation, behavioral problems, sleep disturbance and suicidal ideas.  The patient is not nervous/anxious.     Immunization History  Administered Date(s) Administered  . Influenza Inj Mdck Quad Pf 03/17/2016  . Influenza Whole 03/13/2013  . Influenza-Unspecified 03/04/2014, 03/12/2015, 03/23/2017  . PPD Test 07/05/2012  . Pneumococcal Conjugate-13 07/01/2016  . Td 07/01/2016  . Zoster Recombinat (Shingrix) 06/30/2017   Pertinent  Health Maintenance Due  Topic Date Due  . PNA vac Low Risk Adult (2 of 2 - PPSV23) 07/01/2017  . OPHTHALMOLOGY EXAM  07/06/2017  . HEMOGLOBIN A1C  11/01/2017  . FOOT EXAM  06/01/2018  . URINE MICROALBUMIN  06/01/2018  . INFLUENZA VACCINE  Completed   Fall Risk  09/28/2016 05/29/2015 03/16/2015 02/05/2015 10/20/2014  Falls in the past year? No No No No No  Risk for fall due to : - History of fall(s) - - -   Functional Status Survey:    Vitals:   07/18/17 1330  BP:  119/68  Pulse: 79  Resp: 17  Temp: (!) 97 F (36.1 C)  TempSrc: Oral  SpO2: 96%  Weight: 161 lb (73 kg)   Body mass index is 22.45 kg/m. Physical Exam  Constitutional: He appears well-developed. No distress.  HENT:  Head: Normocephalic and atraumatic.  Eyes:  glasses  Cardiovascular: Normal rate, regular rhythm, normal heart sounds and intact distal pulses.  Pulmonary/Chest: Effort normal and breath sounds normal. No respiratory distress.  Abdominal: Soft. Bowel sounds are normal. He exhibits no distension. There is no tenderness.  Musculoskeletal:  Right hemiparesis, uses manual wheelchair  Neurological: He is alert.  Skin: Skin is warm and dry.  Psychiatric:  Smiling today and appears content    Labs reviewed: Recent Labs    01/09/17 05/03/17  NA 145 142  K 4.2 4.4  BUN 22* 22*  CREATININE 1.3 1.2   Recent Labs    05/03/17  AST 19  ALT 11  ALKPHOS 74   Recent Labs    01/09/17 05/03/17  WBC 8.1 7.5  HGB 11.6* 10.2*  HCT 36* 32*  PLT 187 219   Lab Results  Component Value Date   TSH 1.13 12/22/2015   Lab Results  Component  Value Date   HGBA1C 6.3 05/03/2017   Lab Results  Component Value Date   CHOL 152 06/27/2013   HDL 55 06/27/2013   LDLCALC 78 06/27/2013   TRIG 83 06/27/2013    Assessment/Plan 1. CKD (chronic kidney disease) stage 3, GFR 30-59 ml/min (HCC) -has actually improved from some prior times when he was stage 4 -Avoid nephrotoxic agents like nsaids, dose adjust renally excreted meds, hydrate.  2. Type 2 diabetes mellitus with stage 3 chronic kidney disease, with long-term current use of insulin (HCC) -cont current insulin regimen, control is satisfactory and w/o hypoglycemia  3. Mixed Alzheimer's and vascular dementia -progressing with decreased po intake, weight loss, less interactive and conversive than he'd been -seems benefit of aricept is dwindling as his weight is dropping--might consider tapering off, but will discuss with his daughter, George Kemp, first  4. Adult failure to thrive syndrome -related to progressing dementia  5. Bipolar 1 disorder, depressed, partial remission ([place]) -did not do well with reduction and discontinuation of tca and antipsychotic previously so continue that regimen--has been tried more than once and pt's spirits declined notably   6. Slow transit constipation stable-cont colace  Family/ staff Communication: discussed with snf nurse  Labs/tests ordered:  No new  Lan Entsminger L. Adaysha Dubinsky, D.O. [facility] [address], [place] [address] Cell Phone (Mon-Fri 8am-5pm):  [phone] On Call:  [phone] & follow prompts after 5pm & weekends Office Phone:  [phone] Office Fax:  [phone]

## 2017-07-25 DIAGNOSIS — N184 Chronic kidney disease, stage 4 (severe): Secondary | ICD-10-CM | POA: Insufficient documentation

## 2017-08-21 ENCOUNTER — Encounter: Payer: Self-pay | Admitting: Adult Health

## 2017-08-21 ENCOUNTER — Non-Acute Institutional Stay (SKILLED_NURSING_FACILITY): Payer: Medicare Other | Admitting: Adult Health

## 2017-08-21 DIAGNOSIS — Z794 Long term (current) use of insulin: Secondary | ICD-10-CM | POA: Diagnosis not present

## 2017-08-21 DIAGNOSIS — F0280 Dementia in other diseases classified elsewhere without behavioral disturbance: Secondary | ICD-10-CM

## 2017-08-21 DIAGNOSIS — N183 Chronic kidney disease, stage 3 unspecified: Secondary | ICD-10-CM

## 2017-08-21 DIAGNOSIS — F0150 Vascular dementia without behavioral disturbance: Secondary | ICD-10-CM | POA: Diagnosis not present

## 2017-08-21 DIAGNOSIS — E1122 Type 2 diabetes mellitus with diabetic chronic kidney disease: Secondary | ICD-10-CM | POA: Diagnosis not present

## 2017-08-21 DIAGNOSIS — H00014 Hordeolum externum left upper eyelid: Secondary | ICD-10-CM

## 2017-08-21 DIAGNOSIS — F3175 Bipolar disorder, in partial remission, most recent episode depressed: Secondary | ICD-10-CM | POA: Diagnosis not present

## 2017-08-21 DIAGNOSIS — G309 Alzheimer's disease, unspecified: Secondary | ICD-10-CM

## 2017-08-21 DIAGNOSIS — N400 Enlarged prostate without lower urinary tract symptoms: Secondary | ICD-10-CM | POA: Diagnosis not present

## 2017-08-21 NOTE — Progress Notes (Signed)
Location:  [facility] Room Number: 093 Place of Service:  SNF [id number]) Provider:  Algis Greenhouse, RN, AGNP-Student/ Royal Hawthorn, ANP-BC  Gayland Curry, DO  Patient Care Team: Gayland Curry, DO as PCP - General (Geriatric Medicine) Fay Records, Well [name]  Extended Emergency Contact Information Primary Emergency Contact: [facility] Address: [address]          [place], [place] [address] [name] of [place] Phone: [phone] Mobile Phone: [phone] Relation: Daughter  Code Status:  DNR Goals of care: Advanced Directive information Advanced Directives 07/18/2017  Does Patient Have a Medical Advance Directive? Yes  Type of Advance Directive Out of facility DNR (pink MOST or yellow form);[place];Living will  Does patient want to make changes to medical advance directive? No - Patient declined  Copy of [place] in Chart? Yes  Pre-existing out of facility DNR order (yellow form or pink MOST form) Yellow form placed in chart (order not valid for inpatient use)     Chief Complaint  Patient presents with  . Medical Management of Chronic Issues    HPI:  Pt is a 84 y.o. male seen today for medical management of chronic diseases. Pt does present with a Hordeolum to the right upper exterior eye lid. LPN states is has been present for 3 days and warm compresses have been used. Pt denies pain. States it is irritating.   LPN notes continued decline in his mental status over the last few months. Pt unable to articulate if he notices a difference in his memory or cognition. Pt taking Aricept 10 mg nightly.   Pt notes a steady appetite. He endorses eating a good amount at mealtime. LPNs support this finding. Pt's CBGs are checked with breakfast (0830) and dinner (1730). AM CBGs are 104-150. PM CBGs are 199-249. No episodes of hypoglycemia noted by staff. Pt currently taking 8 U of  Insulin Detemir BID at 0830 and 1730.   No issues noted with voiding; although, difficult to assess d/t incontinence and memory loss. Staff notes that he frequently has soaked depends.   Pt states that he wakes up happy more days than he wakes up sad. Staff note his behavior to be stable as he participates in ADLs and activities throughout the facility. Currently taking Anafranil 25 PO nightly and Seroquel 12.5 mg q HS.    Past Medical History:  Diagnosis Date  . Anemia, iron deficiency 12/30/2012  . Basal cell carcinoma   . Bipolar 1 disorder ([place])   . CKD (chronic kidney disease), stage II 07/09/2012   07/16/12:   stage II chronic kidney disease by GFR.  Follow lab at interval    . Dementia   . Diabetes ([place])   . GI bleed   . Hypertension   . 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 Pt. is aware of deficit. Will benefit from [place] environment for assistance, supervision, socialization    07/16/12:  Admission MDS reviewed: BIMS score 13/15,  functional status: Pt requires limited to extensive assist with all ADLs except eating (supervision)    . Pressure ulcer of sacrum 12/07/2012  . Squamous cell carcinoma of skin of scalp 01/24/14   anterior and posterior scalp   . Stroke [facility]) 08/2009   Left side with residual deficit   Past Surgical History:  Procedure Laterality Date  . HIP ARTHROPLASTY Left 12/01/2012   Procedure: ARTHROPLASTY BIPOLAR HIP;  Surgeon: Johnn Hai, MD;  Location: [facility];  Service: Orthopedics;  Laterality: Left;  . SKIN LESION EXCISION  01/24/14   Anterior and posterio scalp Squamous cell [name] PA    No Known Allergies  Outpatient Encounter Medications as of 08/21/2017  Medication Sig  . acetaminophen (TYLENOL) 325 MG tablet Take 650 mg by mouth every 6 (six) hours as needed for pain.  Marland Kitchen antiseptic oral rinse (BIOTENE) LIQD 15 mLs by Mouth Rinse route 4 (four) times daily.  Marland Kitchen  aspirin 81 MG tablet Take 81 mg by mouth daily.  . cholecalciferol (VITAMIN D) 1000 UNITS tablet Take 1,000 Units by mouth every morning.  . clomiPRAMINE (ANAFRANIL) 25 MG capsule Take 1 capsule (25 mg total) by mouth at bedtime.  . docusate sodium (COLACE) 100 MG capsule Take 100 mg by mouth daily.  Marland Kitchen donepezil (ARICEPT) 10 MG tablet Take 10 mg by mouth daily.  . Insulin Detemir (LEVEMIR FLEXTOUCH) 100 UNIT/ML Pen Inject 8 Units into the skin 2 (two) times daily. Hold for CBG <85   . ketoconazole (NIZORAL) 2 % shampoo Apply 1 application topically 2 (two) times a week.  . latanoprost (XALATAN) 0.005 % ophthalmic solution 1 drop at bedtime.  . metoprolol tartrate (LOPRESSOR) 25 MG tablet Take 75 mg by mouth 2 (two) times daily.   . Multiple Vitamins-Minerals (CENTRUM SILVER 50+MEN) TABS Take by mouth daily.  . QUEtiapine (SEROQUEL) 25 MG tablet Take 12.5 mg by mouth at bedtime.   . tamsulosin (FLOMAX) 0.4 MG CAPS Take 0.4 mg by mouth daily after breakfast.   No facility-administered encounter medications on file as of 08/21/2017.     Review of Systems  Constitutional: Negative for activity change, appetite change, fatigue, fever and unexpected weight change.  HENT: Negative.   Eyes: Positive for pain and redness.  Respiratory: Negative.  Negative for cough, chest tightness and shortness of breath.   Cardiovascular: Negative.   Gastrointestinal: Negative.  Negative for constipation, diarrhea, nausea and vomiting.  Endocrine: Negative for polydipsia, polyphagia and polyuria.  Genitourinary: Negative for difficulty urinating, frequency and urgency.       Incontinence.   Musculoskeletal: Negative.   Skin: Negative.   Neurological: Negative for dizziness, syncope, light-headedness and headaches.  Psychiatric/Behavioral: Negative.  The patient is not nervous/anxious.     Immunization History  Administered Date(s) Administered  . Influenza Inj Mdck Quad Pf 03/17/2016  . Influenza Whole  03/13/2013  . Influenza-Unspecified 03/04/2014, 03/12/2015, 03/23/2017  . PPD Test 07/05/2012  . Pneumococcal Conjugate-13 07/01/2016  . Td 07/01/2016  . Zoster Recombinat (Shingrix) 06/30/2017   Pertinent  Health Maintenance Due  Topic Date Due  . PNA vac Low Risk Adult (2 of 2 - PPSV23) 07/01/2017  . OPHTHALMOLOGY EXAM  07/06/2017  . HEMOGLOBIN A1C  11/01/2017  . FOOT EXAM  06/01/2018  . URINE MICROALBUMIN  06/01/2018  . INFLUENZA VACCINE  Completed   Fall Risk  09/28/2016 05/29/2015 03/16/2015 02/05/2015 10/20/2014  Falls in the past year? No No No No No  Risk for fall due to : - History of fall(s) - - -   Functional Status Survey:    Vitals:   08/21/17 1435  BP: (!) 104/59  Pulse: 72  Resp: 20  Temp: 98.5 F (36.9 C)  SpO2: 95%  Weight: 159 lb 14.4 oz (72.5 kg)   Body mass index is 22.3 kg/m. Physical Exam  Constitutional: He appears well-developed and well-nourished.  Eyes: Left eye exhibits hordeolum.    Cardiovascular: Normal  rate, regular rhythm, normal heart sounds and intact distal pulses.  Pulmonary/Chest: Effort normal and breath sounds normal. No respiratory distress. He has no wheezes.  Abdominal: Soft. Bowel sounds are normal. He exhibits no distension. There is no tenderness. There is no guarding.  Musculoskeletal: He exhibits no edema or tenderness.  LUE/LLE decreased ROM and strength s/p stroke.   Neurological: He is alert.  Dementia at baseline. MMSE 13/30. Mini Cog 0.   Skin: Skin is warm and dry. No erythema.  Psychiatric: He has a normal mood and affect. His speech is normal and behavior is normal. Cognition and memory are impaired.  Baseline vascular dementia.   Nursing note and vitals reviewed.   Labs reviewed: Recent Labs    01/09/17 05/03/17  NA 145 142  K 4.2 4.4  BUN 22* 22*  CREATININE 1.3 1.2   Recent Labs    05/03/17  AST 19  ALT 11  ALKPHOS 74   Recent Labs    01/09/17 05/03/17  WBC 8.1 7.5  HGB 11.6* 10.2*  HCT 36* 32*   PLT 187 219   Lab Results  Component Value Date   TSH 1.13 12/22/2015   Lab Results  Component Value Date   HGBA1C 6.3 05/03/2017   Lab Results  Component Value Date   CHOL 152 06/27/2013   HDL 55 06/27/2013   LDLCALC 78 06/27/2013   TRIG 83 06/27/2013   Wt Readings from Last 3 Encounters:  08/21/17 159 lb 14.4 oz (72.5 kg)  07/18/17 161 lb (73 kg)  06/01/17 163 lb 12.8 oz (74.3 kg)   Significant Diagnostic Results in last 30 days:  No results found.  Assessment/Plan 1. Hordeolum externum of left upper eyelid Warm compress 4x/day.  Notify if it does not improve by 3/30.   2. Mixed Alzheimer's and vascular dementia MMSE 13/30. Mini Cog 0.  SNF continues to be appropriate level of care.  Maintain safety and falls precautions.  Continue assistance with ADLs, medication adherence, & feedings.   3. Type 2 diabetes mellitus with stage 3 chronic kidney disease, with long-term current use of insulin (HCC) Stable.  A1C 6.3 on 05/03/17.  Continue Insulin Devemir BID.  Recheck A1c in June 2019.   4. Benign prostatic hyperplasia without lower urinary tract symptoms Stable.  Continue Flomax as prescribed.   5. Bipolar 1 disorder, depressed, partial remission (HCC) Stable.  Continue Seroquel and Anafranil at bedtime.    Family/ staff Communication: LPN notified of POC  Labs/tests ordered:  N/a

## 2017-09-25 ENCOUNTER — Non-Acute Institutional Stay (SKILLED_NURSING_FACILITY): Payer: Medicare Other | Admitting: Adult Health

## 2017-09-25 ENCOUNTER — Encounter: Payer: Self-pay | Admitting: Adult Health

## 2017-09-25 DIAGNOSIS — F0150 Vascular dementia without behavioral disturbance: Secondary | ICD-10-CM

## 2017-09-25 DIAGNOSIS — N183 Chronic kidney disease, stage 3 unspecified: Secondary | ICD-10-CM

## 2017-09-25 DIAGNOSIS — G309 Alzheimer's disease, unspecified: Secondary | ICD-10-CM | POA: Diagnosis not present

## 2017-09-25 DIAGNOSIS — Z794 Long term (current) use of insulin: Secondary | ICD-10-CM | POA: Diagnosis not present

## 2017-09-25 DIAGNOSIS — E1122 Type 2 diabetes mellitus with diabetic chronic kidney disease: Secondary | ICD-10-CM

## 2017-09-25 DIAGNOSIS — F0280 Dementia in other diseases classified elsewhere without behavioral disturbance: Secondary | ICD-10-CM

## 2017-09-25 DIAGNOSIS — F3177 Bipolar disorder, in partial remission, most recent episode mixed: Secondary | ICD-10-CM

## 2017-09-25 DIAGNOSIS — Z23 Encounter for immunization: Secondary | ICD-10-CM | POA: Diagnosis not present

## 2017-09-25 DIAGNOSIS — D638 Anemia in other chronic diseases classified elsewhere: Secondary | ICD-10-CM

## 2017-09-25 DIAGNOSIS — R1310 Dysphagia, unspecified: Secondary | ICD-10-CM | POA: Insufficient documentation

## 2017-09-25 NOTE — Assessment & Plan Note (Signed)
Continue D3 diet, asp prec.

## 2017-09-25 NOTE — Assessment & Plan Note (Signed)
Controlled. Continue anafranil and seroquel. Has not tolerated dose reduction in the past.

## 2017-09-25 NOTE — Assessment & Plan Note (Signed)
Continue to periodically monitor BMP and avoid nephrotoxic agents

## 2017-09-25 NOTE — Assessment & Plan Note (Signed)
Stable H/H monitor CBC annually

## 2017-09-25 NOTE — Progress Notes (Signed)
Location:  [facility] of Service:  SNF (31) Provider:   Cindi Carbon, ANP [place] [phone]   Gayland Curry, DO  Patient Care Team: Gayland Curry, DO as PCP - General (Geriatric Medicine) Fay Records, Well [name]  Extended Emergency Contact Information Primary Emergency Contact: [facility] Address: [address]          [place],  [address] [name] of [place] Phone: [phone] Mobile Phone: [phone] Relation: Daughter  Code Status:  DNR Goals of care: Advanced Directive information Advanced Directives 07/18/2017  Does Patient Have a Medical Advance Directive? Yes  Type of Advance Directive Out of facility DNR (pink MOST or yellow form);[place];Living will  Does patient want to make changes to medical advance directive? No - Patient declined  Copy of [place] in Chart? Yes  Pre-existing out of facility DNR order (yellow form or pink MOST form) Yellow form placed in chart (order not valid for inpatient use)     Chief Complaint  Patient presents with  . Medical Management of Chronic Issues    HPI:  Pt is a 84 y.o. male seen today for medical management of chronic diseases.  He resides in skilled care due to a CVA with residual left sided weakness. There are no complaints regarding his care.   Dysphagia: D 3 diet tolerated well  DM II:  Lab Results  Component Value Date   HGBA1C 6.3 05/03/2017   CBGS range 119-195 in the am and 180-249 in the pm Due for eye exam, apt cancelled   Dementia: progressive decline  14/30 06/16/17  ACD: Lab Results  Component Value Date   HGB 10.2 (A) 05/03/2017    Bipolar disorder: no issues with depression on mania. Remains pleasant and able to f/c  CKD Lab Results  Component Value Date   CREATININE 1.2 05/03/2017      Past Medical History:  Diagnosis Date  . Anemia, iron deficiency 12/30/2012    . Basal cell carcinoma   . Bipolar 1 disorder ([place])   . CKD (chronic kidney disease), stage II 07/09/2012   07/16/12:   stage II chronic kidney disease by GFR.  Follow lab at interval    . Dementia   . Diabetes ([place])   . GI bleed   . Hypertension   . 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 Pt. is aware of deficit. Will benefit from [place] environment for assistance, supervision, socialization    07/16/12:  Admission MDS reviewed: BIMS score 13/15,  functional status: Pt requires limited to extensive assist with all ADLs except eating (supervision)    . Pressure ulcer of sacrum 12/07/2012  . Squamous cell carcinoma of skin of scalp 01/24/14   anterior and posterior scalp   . Stroke [facility]) 08/2009   Left side with residual deficit   Past Surgical History:  Procedure Laterality Date  . HIP ARTHROPLASTY Left 12/01/2012   Procedure: ARTHROPLASTY BIPOLAR HIP;  Surgeon: Johnn Hai, MD;  Location: [facility]S;  Service: Orthopedics;  Laterality: Left;  . SKIN LESION EXCISION  01/24/14   Anterior and posterio scalp Squamous cell [name] PA    No Known Allergies  Outpatient Encounter Medications as of 09/25/2017  Medication Sig  . acetaminophen (TYLENOL) 325 MG tablet Take 650 mg by mouth every 6 (six) hours as needed for pain.  Marland Kitchen antiseptic oral rinse (BIOTENE) LIQD  15 mLs by Mouth Rinse route 4 (four) times daily.  Marland Kitchen aspirin 81 MG tablet Take 81 mg by mouth daily.  . cholecalciferol (VITAMIN D) 1000 UNITS tablet Take 1,000 Units by mouth every morning.  . clomiPRAMINE (ANAFRANIL) 25 MG capsule Take 1 capsule (25 mg total) by mouth at bedtime.  . docusate sodium (COLACE) 100 MG capsule Take 100 mg by mouth daily.  Marland Kitchen donepezil (ARICEPT) 10 MG tablet Take 10 mg by mouth daily.  . Insulin Detemir (LEVEMIR FLEXTOUCH) 100 UNIT/ML Pen Inject 8 Units into the skin 2 (two) times daily. Hold for CBG <85   . ketoconazole  (NIZORAL) 2 % shampoo Apply 1 application topically 2 (two) times a week.  . latanoprost (XALATAN) 0.005 % ophthalmic solution 1 drop at bedtime.  . metoprolol tartrate (LOPRESSOR) 25 MG tablet Take 75 mg by mouth 2 (two) times daily.   . Multiple Vitamins-Minerals (CENTRUM SILVER 50+MEN) TABS Take by mouth daily.  . QUEtiapine (SEROQUEL) 25 MG tablet Take 12.5 mg by mouth at bedtime.   . tamsulosin (FLOMAX) 0.4 MG CAPS Take 0.4 mg by mouth daily after breakfast.   No facility-administered encounter medications on file as of 09/25/2017.     Review of Systems  Constitutional: Negative for activity change, appetite change, chills, diaphoresis, fatigue, fever and unexpected weight change.  Respiratory: Positive for cough (x 1 day). Negative for shortness of breath, wheezing and stridor.   Cardiovascular: Negative for chest pain, palpitations and leg swelling.  Gastrointestinal: Negative for abdominal distention, abdominal pain, constipation and diarrhea.  Genitourinary: Negative for difficulty urinating and dysuria.       Incontinent  Musculoskeletal: Positive for gait problem. Negative for arthralgias, back pain, joint swelling and myalgias.  Skin: Negative for wound.  Neurological: Positive for weakness. Negative for dizziness, seizures, syncope, facial asymmetry, speech difficulty and headaches.  Hematological: Negative for adenopathy. Does not bruise/bleed easily.  Psychiatric/Behavioral: Positive for confusion. Negative for agitation and behavioral problems.    Immunization History  Administered Date(s) Administered  . Influenza Inj Mdck Quad Pf 03/17/2016  . Influenza Whole 03/13/2013  . Influenza-Unspecified 03/04/2014, 03/12/2015, 03/23/2017  . PPD Test 07/05/2012  . Pneumococcal Conjugate-13 07/01/2016  . Td 07/01/2016  . Zoster Recombinat (Shingrix) 06/30/2017   Pertinent  Health Maintenance Due  Topic Date Due  . PNA vac Low Risk Adult (2 of 2 - PPSV23) 07/01/2017  .  OPHTHALMOLOGY EXAM  07/06/2017  . HEMOGLOBIN A1C  11/01/2017  . INFLUENZA VACCINE  12/28/2017  . FOOT EXAM  06/01/2018  . URINE MICROALBUMIN  06/01/2018   Fall Risk  09/28/2016 05/29/2015 03/16/2015 02/05/2015 10/20/2014  Falls in the past year? No No No No No  Risk for fall due to : - History of fall(s) - - -   Functional Status Survey:    Vitals:   09/25/17 1608  BP: 105/64  Pulse: 86  Weight: 160 lb 9.6 oz (72.8 kg)   Body mass index is 22.4 kg/m. Physical Exam  Constitutional: No distress.  HENT:  Head: Normocephalic and atraumatic.  Mouth/Throat: No oropharyngeal exudate.  Cardiovascular: Normal rate and regular rhythm.  No murmur heard. Pulmonary/Chest: Effort normal and breath sounds normal. No respiratory distress. He has no wheezes.  Abdominal: Soft. Bowel sounds are normal. He exhibits no distension. There is no tenderness.  Lymphadenopathy:    He has no cervical adenopathy.  Neurological: He is alert. No cranial nerve deficit.  Left sided weakness. Oriented to self and place but not time. Able  to f/c and pleasant  Skin: Skin is warm and dry. He is not diaphoretic.  Psychiatric: He has a normal mood and affect.    Labs reviewed: Recent Labs    01/09/17 05/03/17  NA 145 142  K 4.2 4.4  BUN 22* 22*  CREATININE 1.3 1.2   Recent Labs    05/03/17  AST 19  ALT 11  ALKPHOS 74   Recent Labs    01/09/17 05/03/17  WBC 8.1 7.5  HGB 11.6* 10.2*  HCT 36* 32*  PLT 187 219   Lab Results  Component Value Date   TSH 1.13 12/22/2015   Lab Results  Component Value Date   HGBA1C 6.3 05/03/2017   Lab Results  Component Value Date   CHOL 152 06/27/2013   HDL 55 06/27/2013   LDLCALC 78 06/27/2013   TRIG 83 06/27/2013    Significant Diagnostic Results in last 30 days:  No results found.  Assessment/Plan Mixed Alzheimer's and vascular dementia Progressive decline in cognition Continue aricept, would likely not benefit from namenda  Diabetes mellitus with  renal manifestation Continue to monitor A1C q 6 mo Goal <8% Continue Levemir 8 units BID  Anemia of chronic disease Stable H/H monitor CBC annually  CKD (chronic kidney disease) stage 3, GFR 30-59 ml/min Continue to periodically monitor BMP and avoid nephrotoxic agents   Bipolar disorder Controlled. Continue anafranil and seroquel. Has not tolerated dose reduction in the past.   Dysphagia Continue D3 diet, asp prec.    Pneumovax ordered Staff to reschedule eye exam   Family/ staff Communication: discussed with staff/resident  Labs/tests ordered:  NA

## 2017-09-25 NOTE — Assessment & Plan Note (Signed)
Progressive decline in cognition Continue aricept, would likely not benefit from namenda

## 2017-09-25 NOTE — Assessment & Plan Note (Signed)
Continue to monitor A1C q 6 mo Goal <8% Continue Levemir 8 units BID

## 2017-09-27 LAB — HM DIABETES EYE EXAM

## 2017-10-02 ENCOUNTER — Encounter: Payer: Self-pay | Admitting: *Deleted

## 2017-10-03 ENCOUNTER — Non-Acute Institutional Stay (SKILLED_NURSING_FACILITY): Payer: Medicare Other

## 2017-10-03 DIAGNOSIS — Z0000 Encounter for general adult medical examination without abnormal findings: Secondary | ICD-10-CM | POA: Diagnosis not present

## 2017-10-03 NOTE — Patient Instructions (Signed)
Mr. George Kemp , Thank you for taking time to come for your Medicare Wellness Visit. I appreciate your ongoing commitment to your health goals. Please review the following plan we discussed and let me know if I can assist you in the future.   Screening recommendations/referrals: Colonoscopy excluded, over age 82 Recommended yearly ophthalmology/optometry visit for glaucoma screening and checkup Recommended yearly dental visit for hygiene and checkup  Vaccinations: Influenza vaccine up to date, due 2019 fall season Pneumococcal vaccine up to date, completed Tdap vaccine up to date, due 06/30/2026 Shingles vaccine not in past records    Advanced directives: in chart  Conditions/risks identified: none  Next appointment: Dr. Mariea Clonts makes rounds  Preventive Care 82 Years and Older, Male Preventive care refers to lifestyle choices and visits with your health care provider that can promote health and wellness. What does preventive care include?  A yearly physical exam. This is also called an annual well check.  Dental exams once or twice a year.  Routine eye exams. Ask your health care provider how often you should have your eyes checked.  Personal lifestyle choices, including:  Daily care of your teeth and gums.  Regular physical activity.  Eating a healthy diet.  Avoiding tobacco and drug use.  Limiting alcohol use.  Practicing safe sex.  Taking low doses of aspirin every day.  Taking vitamin and mineral supplements as recommended by your health care provider. What happens during an annual well check? The services and screenings done by your health care provider during your annual well check will depend on your age, overall health, lifestyle risk factors, and family history of disease. Counseling  Your health care provider may ask you questions about your:  Alcohol use.  Tobacco use.  Drug use.  Emotional well-being.  Home and relationship well-being.  Sexual  activity.  Eating habits.  History of falls.  Memory and ability to understand (cognition).  Work and work Statistician. Screening  You may have the following tests or measurements:  Height, weight, and BMI.  Blood pressure.  Lipid and cholesterol levels. These may be checked every 5 years, or more frequently if you are over 63 years old.  Skin check.  Lung cancer screening. You may have this screening every year starting at age 81 if you have a 30-pack-year history of smoking and currently smoke or have quit within the past 15 years.  Fecal occult blood test (FOBT) of the stool. You may have this test every year starting at age 9.  Flexible sigmoidoscopy or colonoscopy. You may have a sigmoidoscopy every 5 years or a colonoscopy every 10 years starting at age 23.  Prostate cancer screening. Recommendations will vary depending on your family history and other risks.  Hepatitis C blood test.  Hepatitis B blood test.  Sexually transmitted disease (STD) testing.  Diabetes screening. This is done by checking your blood sugar (glucose) after you have not eaten for a while (fasting). You may have this done every 1-3 years.  Abdominal aortic aneurysm (AAA) screening. You may need this if you are a current or former smoker.  Osteoporosis. You may be screened starting at age 61 if you are at high risk. Talk with your health care provider about your test results, treatment options, and if necessary, the need for more tests. Vaccines  Your health care provider may recommend certain vaccines, such as:  Influenza vaccine. This is recommended every year.  Tetanus, diphtheria, and acellular pertussis (Tdap, Td) vaccine. You may need a  Td booster every 10 years.  Zoster vaccine. You may need this after age 86.  Pneumococcal 13-valent conjugate (PCV13) vaccine. One dose is recommended after age 70.  Pneumococcal polysaccharide (PPSV23) vaccine. One dose is recommended after age  69. Talk to your health care provider about which screenings and vaccines you need and how often you need them. This information is not intended to replace advice given to you by your health care provider. Make sure you discuss any questions you have with your health care provider. Document Released: 06/12/2015 Document Revised: 02/03/2016 Document Reviewed: 03/17/2015 Elsevier Interactive Patient Education  2017 [place] Prevention in the Home Falls can cause injuries. They can happen to people of all ages. There are many things you can do to make your home safe and to help prevent falls. What can I do on the outside of my home?  Regularly fix the edges of walkways and driveways and fix any cracks.  Remove anything that might make you trip as you walk through a door, such as a raised step or threshold.  Trim any bushes or trees on the path to your home.  Use bright outdoor lighting.  Clear any walking paths of anything that might make someone trip, such as rocks or tools.  Regularly check to see if handrails are loose or broken. Make sure that both sides of any steps have handrails.  Any raised decks and porches should have guardrails on the edges.  Have any leaves, snow, or ice cleared regularly.  Use sand or salt on walking paths during winter.  Clean up any spills in your garage right away. This includes oil or grease spills. What can I do in the bathroom?  Use night lights.  Install grab bars by the toilet and in the tub and shower. Do not use towel bars as grab bars.  Use non-skid mats or decals in the tub or shower.  If you need to sit down in the shower, use a plastic, non-slip stool.  Keep the floor dry. Clean up any water that spills on the floor as soon as it happens.  Remove soap buildup in the tub or shower regularly.  Attach bath mats securely with double-sided non-slip rug tape.  Do not have throw rugs and other things on the floor that can make  you trip. What can I do in the bedroom?  Use night lights.  Make sure that you have a light by your bed that is easy to reach.  Do not use any sheets or blankets that are too big for your bed. They should not hang down onto the floor.  Have a firm chair that has side arms. You can use this for support while you get dressed.  Do not have throw rugs and other things on the floor that can make you trip. What can I do in the kitchen?  Clean up any spills right away.  Avoid walking on wet floors.  Keep items that you use a lot in easy-to-reach places.  If you need to reach something above you, use a strong step stool that has a grab bar.  Keep electrical cords out of the way.  Do not use floor polish or wax that makes floors slippery. If you must use wax, use non-skid floor wax.  Do not have throw rugs and other things on the floor that can make you trip. What can I do with my stairs?  Do not leave any items on the stairs.  Make sure that there are handrails on both sides of the stairs and use them. Fix handrails that are broken or loose. Make sure that handrails are as long as the stairways.  Check any carpeting to make sure that it is firmly attached to the stairs. Fix any carpet that is loose or worn.  Avoid having throw rugs at the top or bottom of the stairs. If you do have throw rugs, attach them to the floor with carpet tape.  Make sure that you have a light switch at the top of the stairs and the bottom of the stairs. If you do not have them, ask someone to add them for you. What else can I do to help prevent falls?  Wear shoes that:  Do not have high heels.  Have rubber bottoms.  Are comfortable and fit you well.  Are closed at the toe. Do not wear sandals.  If you use a stepladder:  Make sure that it is fully opened. Do not climb a closed stepladder.  Make sure that both sides of the stepladder are locked into place.  Ask someone to hold it for you, if  possible.  Clearly mark and make sure that you can see:  Any grab bars or handrails.  First and last steps.  Where the edge of each step is.  Use tools that help you move around (mobility aids) if they are needed. These include:  Canes.  Walkers.  Scooters.  Crutches.  Turn on the lights when you go into a dark area. Replace any light bulbs as soon as they burn out.  Set up your furniture so you have a clear path. Avoid moving your furniture around.  If any of your floors are uneven, fix them.  If there are any pets around you, be aware of where they are.  Review your medicines with your doctor. Some medicines can make you feel dizzy. This can increase your chance of falling. Ask your doctor what other things that you can do to help prevent falls. This information is not intended to replace advice given to you by your health care provider. Make sure you discuss any questions you have with your health care provider. Document Released: 03/12/2009 Document Revised: 10/22/2015 Document Reviewed: 06/20/2014 Elsevier Interactive Patient Education  2017 [employer].

## 2017-10-03 NOTE — Progress Notes (Signed)
Subjective:   George Kemp is a 84 y.o. male who presents for Medicare Annual/Subsequent preventive examination at [facility]  Last AWV-01/25/2012    Objective:    Vitals: BP 120/60 (BP Location: Left Arm, Patient Position: Sitting)   Pulse 68   Temp 97.9 F (36.6 C) (Oral)   Ht 5\' 11"  (1.803 m)   Wt 161 lb (73 kg)   SpO2 91%   BMI 22.45 kg/m   Body mass index is 22.45 kg/m.  Advanced Directives 10/03/2017 07/18/2017 05/02/2017 02/28/2017 02/06/2017 01/10/2017 01/06/2017  Does Patient Have a Medical Advance Directive? Yes Yes Yes Yes Yes - Yes  Type of Advance Directive [place];Out of facility DNR (pink MOST or yellow form) Out of facility DNR (pink MOST or yellow form);[place];Living will Out of facility DNR (pink MOST or yellow form);[place];Living will [place];Living will;Out of facility DNR (pink MOST or yellow form) Out of facility DNR (pink MOST or yellow form);[place];Living will Out of facility DNR (pink MOST or yellow form);[place];Living will Out of facility DNR (pink MOST or yellow form);[place];Living will  Does patient want to make changes to medical advance directive? No - Patient declined No - Patient declined No - Patient declined - - - -  Copy of [place] in Chart? Yes Yes Yes Yes Yes Yes Yes  Pre-existing out of facility DNR order (yellow form or pink MOST form) Yellow form placed in chart (order not valid for inpatient use) Yellow form placed in chart (order not valid for inpatient use) Yellow form placed in chart (order not valid for inpatient use) Yellow form placed in chart (order not valid for inpatient use) Yellow form placed in chart (order not valid for inpatient use) Yellow form placed in chart (order not valid for inpatient use) Yellow form placed in chart (order not valid for inpatient use)      Tobacco Social History   Tobacco Use  Smoking Status Former Smoker  . Packs/day: 1.00  Smokeless Tobacco Never Used     Counseling given: Not Answered   Clinical Intake:  Pre-visit preparation completed: No  Pain : No/denies pain     Nutritional Risks: None Diabetes: Yes CBG done?: No Did pt. bring in CBG monitor from home?: No  How often do you need to have someone help you when you read instructions, pamphlets, or other written materials from your doctor or pharmacy?: 3 - Sometimes  Interpreter Needed?: No  Information entered by :: Tyson Dense, RN  Past Medical History:  Diagnosis Date  . Anemia, iron deficiency 12/30/2012  . Basal cell carcinoma   . Bipolar 1 disorder ([place])   . CKD (chronic kidney disease), stage II 07/09/2012   07/16/12:   stage II chronic kidney disease by GFR.  Follow lab at interval    . Dementia   . Diabetes ([place])   . GI bleed   . Hypertension   . 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 Pt. is aware of deficit. Will benefit from [place] environment for assistance, supervision, socialization    07/16/12:  Admission MDS reviewed: BIMS score 13/15,  functional status: Pt requires limited to extensive assist with all ADLs except eating (supervision)    . Pressure ulcer of sacrum 12/07/2012  . Squamous cell carcinoma of skin of scalp 01/24/14  anterior and posterior scalp   . Stroke [facility]) 08/2009   Left side with residual deficit   Past Surgical History:  Procedure Laterality Date  . HIP ARTHROPLASTY Left 12/01/2012   Procedure: ARTHROPLASTY BIPOLAR HIP;  Surgeon: Johnn Hai, MD;  Location: [facility] ORS;  Service: Orthopedics;  Laterality: Left;  . SKIN LESION EXCISION  01/24/14   Anterior and posterio scalp Squamous cell [name] PA   Family History  Problem Relation Age of Onset  . Stroke Mother   . Congestive Heart Failure Mother   . Cancer - Other Father         Unknown  Type  . Cancer Father   . Stroke Brother         X 2  . Diabetes Brother   . Thyroid disease Daughter    Social History   Socioeconomic History  . Marital status: Married    Spouse name: Not on file  . Number of children: Not on file  . Years of education: Not on file  . Highest education level: Not on file  Occupational History  . Occupation: retired Engineer, maintenance (IT)  Social Needs  . Financial resource strain: Not hard at all  . Food insecurity:    Worry: Never true    Inability: Never true  . Transportation needs:    Medical: No    Non-medical: No  Tobacco Use  . Smoking status: Former Smoker    Packs/day: 1.00  . Smokeless tobacco: Never Used  Substance and Sexual Activity  . Alcohol use: No  . Drug use: No  . Sexual activity: Never  Lifestyle  . Physical activity:    Days per week: 0 days    Minutes per session: 0 min  . Stress: Only a little  Relationships  . Social connections:    Talks on phone: Once a week    Gets together: Once a week    Attends religious service: Never    Active member of club or organization: No    Attends meetings of clubs or organizations: Never    Relationship status: Married  Other Topics Concern  . Not on file  Social History Narrative   His wife passed away in 09/25/2015. Resides at [facility], Skilled nursing section since 05/2012.  He is a retired Engineer, maintenance (IT). Stopped smoking many years ago, drinks minimal alcohol. Pt. Has Advanced Directives: DNR.    Outpatient Encounter Medications as of 10/03/2017  Medication Sig  . acetaminophen (TYLENOL) 325 MG tablet Take 650 mg by mouth every 6 (six) hours as needed for pain.  Marland Kitchen antiseptic oral rinse (BIOTENE) LIQD 15 mLs by Mouth Rinse route 4 (four) times daily.  Marland Kitchen aspirin 81 MG tablet Take 81 mg by mouth daily.  . cholecalciferol (VITAMIN D) 1000 UNITS tablet Take 1,000 Units by mouth every morning.  . clomiPRAMINE (ANAFRANIL) 25 MG capsule Take 1 capsule (25 mg total) by  mouth at bedtime.  . docusate sodium (COLACE) 100 MG capsule Take 100 mg by mouth daily.  Marland Kitchen donepezil (ARICEPT) 10 MG tablet Take 10 mg by mouth daily.  . Insulin Detemir (LEVEMIR FLEXTOUCH) 100 UNIT/ML Pen Inject 8 Units into the skin 2 (two) times daily. Hold for CBG <85   . ketoconazole (NIZORAL) 2 % shampoo Apply 1 application topically 2 (two) times a week.  . latanoprost (XALATAN) 0.005 % ophthalmic solution 1 drop at bedtime.  . metoprolol tartrate (LOPRESSOR) 25 MG tablet Take 75 mg by mouth 2 (two) times daily.   Marland Kitchen  Multiple Vitamins-Minerals (CENTRUM SILVER 50+MEN) TABS Take by mouth daily.  . QUEtiapine (SEROQUEL) 25 MG tablet Take 12.5 mg by mouth at bedtime.   . tamsulosin (FLOMAX) 0.4 MG CAPS Take 0.4 mg by mouth daily after breakfast.   No facility-administered encounter medications on file as of 10/03/2017.     Activities of Daily Living In your present state of health, do you have any difficulty performing the following activities: 10/03/2017  Hearing? N  Vision? N  Difficulty concentrating or making decisions? Y  Walking or climbing stairs? Y  Dressing or bathing? Y  Doing errands, shopping? Y  Preparing Food and eating ? Y  Using the Toilet? Y  In the past six months, have you accidently leaked urine? N  Do you have problems with loss of bowel control? N  Managing your Medications? Y  Managing your Finances? Y  Housekeeping or managing your Housekeeping? Y  Some recent data might be hidden    Patient Care Team: Gayland Curry, DO as PCP - General (Geriatric Medicine) [facility]   Assessment:   This is a routine wellness examination for [place].  Exercise Activities and Dietary recommendations Current Exercise Habits: The patient does not participate in regular exercise at present, Exercise limited by: orthopedic condition(s);neurologic condition(s)  Goals    None      Fall Risk Fall Risk  10/03/2017 09/28/2016 05/29/2015 03/16/2015  02/05/2015  Falls in the past year? No No No No No  Risk for fall due to : - - History of fall(s) - -   Is the patient's home free of loose throw rugs in walkways, pet beds, electrical cords, etc?   yes      Grab bars in the bathroom? yes      Handrails on the stairs?   yes      Adequate lighting?   yes  Depression Screen PHQ 2/9 Scores 10/03/2017 09/28/2016 05/29/2015 03/16/2015  PHQ - 2 Score 0 0 0 0    Cognitive Function: within last year MMSE - Mini Mental State Exam 06/16/2017 08/22/2016  Orientation to time 0 0  Orientation to Place 2 3  Registration 3 3  Attention/ Calculation 0 2  Recall 2 0  Language- name 2 objects 2 2  Language- repeat 1 1  Language- follow 3 step command 3 3  Language- read & follow direction 1 1  Write a sentence 0 1  Copy design 0 0  Total score 14 16        Immunization History  Administered Date(s) Administered  . Influenza Inj Mdck Quad Pf 03/17/2016  . Influenza Whole 03/13/2013  . Influenza-Unspecified 03/04/2014, 03/12/2015, 03/23/2017  . PPD Test 07/05/2012  . Pneumococcal Conjugate-13 07/01/2016  . Pneumococcal Polysaccharide-23 07/16/1996  . Td 07/01/2016  . Zoster Recombinat (Shingrix) 06/30/2017    Qualifies for Shingles Vaccine? Waiting for second shot  Screening Tests Health Maintenance  Topic Date Due  . HEMOGLOBIN A1C  11/01/2017  . INFLUENZA VACCINE  12/28/2017  . FOOT EXAM  06/01/2018  . URINE MICROALBUMIN  06/02/2018  . OPHTHALMOLOGY EXAM  09/28/2018  . TETANUS/TDAP  07/01/2026  . PNA vac Low Risk Adult  Completed   Cancer Screenings: Lung: Low Dose CT Chest recommended if Age 21-80 years, 30 pack-year currently smoking OR have quit w/in 15years. Patient does not qualify. Colorectal: up to date  Additional Screenings:  Hepatitis C Screening:declined    Plan:    I have personally reviewed and addressed the Medicare  Annual Wellness questionnaire and have noted the following in the patient's chart:  A. Medical and  social history B. Use of alcohol, tobacco or illicit drugs  C. Current medications and supplements D. Functional ability and status E.  Nutritional status F.  Physical activity G. Advance directives H. List of other physicians I.  Hospitalizations, surgeries, and ER visits in previous 12 months J.  [place] to include hearing, vision, cognitive, depression L. Referrals and appointments - none  In addition, I have reviewed and discussed with patient certain preventive protocols, quality metrics, and best practice recommendations. A written personalized care plan for preventive services as well as general preventive health recommendations were provided to patient.  See attached scanned questionnaire for additional information.   Signed,   Tyson Dense, RN Nurse Health Advisor  Patient Concerns: None

## 2017-10-24 ENCOUNTER — Encounter: Payer: Self-pay | Admitting: Internal Medicine

## 2017-10-24 ENCOUNTER — Non-Acute Institutional Stay (SKILLED_NURSING_FACILITY): Payer: Medicare Other | Admitting: Internal Medicine

## 2017-10-24 DIAGNOSIS — D638 Anemia in other chronic diseases classified elsewhere: Secondary | ICD-10-CM

## 2017-10-24 DIAGNOSIS — E1122 Type 2 diabetes mellitus with diabetic chronic kidney disease: Secondary | ICD-10-CM

## 2017-10-24 DIAGNOSIS — Z794 Long term (current) use of insulin: Secondary | ICD-10-CM

## 2017-10-24 DIAGNOSIS — F3177 Bipolar disorder, in partial remission, most recent episode mixed: Secondary | ICD-10-CM | POA: Diagnosis not present

## 2017-10-24 DIAGNOSIS — L219 Seborrheic dermatitis, unspecified: Secondary | ICD-10-CM | POA: Diagnosis not present

## 2017-10-24 DIAGNOSIS — F0150 Vascular dementia without behavioral disturbance: Secondary | ICD-10-CM

## 2017-10-24 DIAGNOSIS — N183 Chronic kidney disease, stage 3 unspecified: Secondary | ICD-10-CM

## 2017-10-24 DIAGNOSIS — G309 Alzheimer's disease, unspecified: Secondary | ICD-10-CM | POA: Diagnosis not present

## 2017-10-24 DIAGNOSIS — K5901 Slow transit constipation: Secondary | ICD-10-CM

## 2017-10-24 DIAGNOSIS — F0280 Dementia in other diseases classified elsewhere without behavioral disturbance: Secondary | ICD-10-CM

## 2017-10-24 NOTE — Progress Notes (Signed)
Patient ID: George Kemp, male   DOB: 13-Oct-1926, 84 y.o.   MRN: [id number]  Location:  [facility] Room Number: 073 Place of Service:  SNF ([id number]) Provider:   Gayland Curry, DO  Patient Care Team: Gayland Curry, DO as PCP - General (Geriatric Medicine) Community, [facility] [name]  Extended Emergency Contact Information Primary Emergency Contact: [facility] Address: [address]          [place], [place] [address] George Kemp of [place] Phone: [phone] Mobile Phone: [phone] Relation: Daughter  Code Status: DNR Goals of care: Advanced Directive information Advanced Directives 10/24/2017  Does Patient Have a Medical Advance Directive? Yes  Type of Paramedic of [place];Living will;Out of facility DNR (pink MOST or yellow form)  Does patient want to make changes to medical advance directive? No - Patient declined  Copy of [place] in Chart? Yes  Pre-existing out of facility DNR order (yellow form or pink MOST form) Yellow form placed in chart (order not valid for inpatient use)     Chief Complaint  Patient presents with  . Medical Management of Chronic Issues    Routine Visit    HPI:  Pt is a 84 y.o. male seen today for medical management of chronic diseases--he has a h/o mixed AD and vascular dementia, prior stroke with right hemiparesis, htn, diabetes mellitus 2, bipolar disorder, ckd.  When I spoke with nursing about him, SNF nurse noted his confusion is worse lately--for example, he was coming out of his room not knowing where he is or where he is going.  He was asking the staff.  He also had a small spot on his buttock that looked like a blood blister that was cleaned and covered with mepilex b/c it was painful and hurting him.  Turned out his cushion had gone flat for his wheelchair and the representative had to be contacted from the DME company to get it straightened out.      When I saw him, he was taking his afternoon nap.  He denied pain.  He reported sleeping well at night.  Staff did not report concerns about this.  He is having regular bowel movements.  His speech seemed a bit more confused to me--previously, most sentences made some sense, but now his words are more jumbled.  His CBGs have been in the 200s in the evenings, but he's been having some sweets that he enjoys prior to those checks.    Past Medical History:  Diagnosis Date  . Anemia, iron deficiency 12/30/2012  . Basal cell carcinoma   . Bipolar 1 disorder ([place])   . CKD (chronic kidney disease), stage II 07/09/2012   07/16/12:   stage II chronic kidney disease by GFR.  Follow lab at interval    . Dementia   . Diabetes ([place])   . GI bleed   . Hypertension   . 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 Pt. is aware of deficit. Will benefit from [place] environment for assistance, supervision, socialization    07/16/12:  Admission MDS reviewed: BIMS score 13/15,  functional status: Pt requires limited to extensive assist with all ADLs except eating (supervision)    . Pressure ulcer of sacrum 12/07/2012  . Squamous cell carcinoma of skin of scalp 01/24/14   anterior and posterior scalp   . Stroke [facility]) 08/2009   Left side with residual deficit  Past Surgical History:  Procedure Laterality Date  . HIP ARTHROPLASTY Left 12/01/2012   Procedure: ARTHROPLASTY BIPOLAR HIP;  Surgeon: Johnn Hai, MD;  Location: [facility];  Service: Orthopedics;  Laterality: Left;  . SKIN LESION EXCISION  01/24/14   Anterior and posterio scalp Squamous cell [name] PA    No Known Allergies  Outpatient Encounter Medications as of 10/24/2017  Medication Sig  . acetaminophen (TYLENOL) 325 MG tablet Take 650 mg by mouth every 6 (six) hours as needed for pain.  Marland Kitchen antiseptic oral rinse (BIOTENE) LIQD 15 mLs by Mouth Rinse route 4 (four) times daily.   Marland Kitchen aspirin 81 MG tablet Take 81 mg by mouth daily.  . cholecalciferol (VITAMIN D) 1000 UNITS tablet Take 1,000 Units by mouth every morning.  . clomiPRAMINE (ANAFRANIL) 25 MG capsule Take 1 capsule (25 mg total) by mouth at bedtime.  . docusate sodium (COLACE) 100 MG capsule Take 100 mg by mouth daily.  Marland Kitchen donepezil (ARICEPT) 10 MG tablet Take 10 mg by mouth daily.  . Insulin Detemir (LEVEMIR FLEXTOUCH) 100 UNIT/ML Pen Inject 8 Units into the skin 2 (two) times daily. Hold for CBG <85   . ketoconazole (NIZORAL) 2 % shampoo Apply 1 application topically 2 (two) times a week.  . latanoprost (XALATAN) 0.005 % ophthalmic solution 1 drop at bedtime.  . metoprolol tartrate (LOPRESSOR) 25 MG tablet Take 75 mg by mouth 2 (two) times daily.   . Multiple Vitamins-Minerals (CENTRUM SILVER 50+MEN) TABS Take by mouth daily.  . QUEtiapine (SEROQUEL) 25 MG tablet Take 12.5 mg by mouth at bedtime.   . tamsulosin (FLOMAX) 0.4 MG CAPS Take 0.4 mg by mouth daily after breakfast.   No facility-administered encounter medications on file as of 10/24/2017.     Review of Systems  Constitutional: Negative for activity change, appetite change, chills, fatigue and fever.  HENT: Negative for congestion.   Eyes: Negative for visual disturbance.       Glasses  Respiratory: Negative for cough, chest tightness and shortness of breath.   Cardiovascular: Negative for chest pain, palpitations and leg swelling.  Gastrointestinal: Negative for abdominal pain, blood in stool, constipation, diarrhea and nausea.  Genitourinary: Negative for dysuria.  Musculoskeletal: Positive for gait problem. Negative for arthralgias and back pain.  Neurological: Positive for weakness.       Right hemiparesis  Psychiatric/Behavioral: Positive for confusion. Negative for agitation and behavioral problems.       Increased difficulty navigating--needing more staff guidance to get around    Immunization History  Administered Date(s)  Administered  . Influenza Inj Mdck Quad Pf 03/17/2016  . Influenza Whole 03/13/2013  . Influenza-Unspecified 03/04/2014, 03/12/2015, 03/23/2017  . PPD Test 07/05/2012  . Pneumococcal Conjugate-13 07/01/2016  . Pneumococcal Polysaccharide-23 07/16/1996  . Td 07/01/2016  . Zoster Recombinat (Shingrix) 06/30/2017   Pertinent  Health Maintenance Due  Topic Date Due  . HEMOGLOBIN A1C  11/01/2017  . INFLUENZA VACCINE  12/28/2017  . FOOT EXAM  06/01/2018  . URINE MICROALBUMIN  06/02/2018  . OPHTHALMOLOGY EXAM  09/28/2018  . PNA vac Low Risk Adult  Completed   Fall Risk  10/03/2017 09/28/2016 05/29/2015 03/16/2015 02/05/2015  Falls in the past year? No No No No No  Risk for fall due to : - - History of fall(s) - -   Functional Status Survey:    Vitals:   10/24/17 1151  BP: 115/71  Pulse: 88  Resp: 18  Temp: (!) 97.3 F (36.3 C)  TempSrc:  Oral  SpO2: 99%  Weight: 160 lb (72.6 kg)  Height: 5\' 11"  (1.803 m)   Body mass index is 22.32 kg/m. Physical Exam  Constitutional: He appears well-developed. No distress.  HENT:  Head: Normocephalic and atraumatic.  Eyes:  glasses  Cardiovascular: Normal rate, regular rhythm, normal heart sounds and intact distal pulses.  Pulmonary/Chest: Effort normal and breath sounds normal. No respiratory distress.  Abdominal: Soft. Bowel sounds are normal. He exhibits no distension. There is no tenderness.  Musculoskeletal:  Right hemiparesis, AFO right ankle  Neurological: He is alert.  Skin: Skin is warm and dry.  Psychiatric: He has a normal mood and affect.  Spirits brighter past couple of visits again    Labs reviewed: Recent Labs    01/09/17 05/03/17  NA 145 142  K 4.2 4.4  BUN 22* 22*  CREATININE 1.3 1.2   Recent Labs    05/03/17  AST 19  ALT 11  ALKPHOS 74   Recent Labs    01/09/17 05/03/17  WBC 8.1 7.5  HGB 11.6* 10.2*  HCT 36* 32*  PLT 187 219   Lab Results  Component Value Date   TSH 1.13 12/22/2015   Lab Results   Component Value Date   HGBA1C 6.3 05/03/2017   Lab Results  Component Value Date   CHOL 152 06/27/2013   HDL 55 06/27/2013   LDLCALC 78 06/27/2013   TRIG 83 06/27/2013    Assessment/Plan 1. Mixed Alzheimer's and vascular dementia -progressing gradually, cont skilled care, doing ok with aricept and taking pills not a problem so we won't rock the boat here MMSE - Mini Mental State Exam 06/16/2017 08/22/2016  Orientation to time 0 0  Orientation to Place 2 3  Registration 3 3  Attention/ Calculation 0 2  Recall 2 0  Language- name 2 objects 2 2  Language- repeat 1 1  Language- follow 3 step command 3 3  Language- read & follow direction 1 1  Write a sentence 0 1  Copy design 0 0  Total score 14 16     2. Type 2 diabetes mellitus with stage 3 chronic kidney disease, with long-term current use of insulin (HCC) -cbgs trended up in evenings, but eating sweets which he likes and we want his QOL to be good -no recent hba1c so will check it and bmp for renal function -cont levemir 8 units bid, may need higher evening dose depending on hba1c  3. Anemia of chronic disease -due to ckd -not on meds, f/u lab  4. Bipolar disorder, in partial remission, most recent episode mixed ([place]) -cont anafranil and seroquel--had a setback when these were stopped before  5. Slow transit constipation -cont colace stool softener which has been effective  6. Seborrheic dermatitis of scalp -ongoing, cont nizoral shampoo twice a week  Family/ staff Communication: discussed with SNF nurse  Labs/tests ordered:  hba1c , bmp next draw  [place]. Esau Fridman, D.O. [facility] [address], [place] [address] Cell Phone (Mon-Fri 8am-5pm):  [phone] On Call:  [phone] & follow prompts after 5pm & weekends Office Phone:  [phone] Office Fax:  [phone]

## 2017-11-23 ENCOUNTER — Encounter: Payer: Self-pay | Admitting: Adult Health

## 2017-11-23 ENCOUNTER — Non-Acute Institutional Stay (SKILLED_NURSING_FACILITY): Payer: Medicare Other | Admitting: Adult Health

## 2017-11-23 DIAGNOSIS — E1122 Type 2 diabetes mellitus with diabetic chronic kidney disease: Secondary | ICD-10-CM

## 2017-11-23 DIAGNOSIS — Z794 Long term (current) use of insulin: Secondary | ICD-10-CM

## 2017-11-23 DIAGNOSIS — D638 Anemia in other chronic diseases classified elsewhere: Secondary | ICD-10-CM

## 2017-11-23 DIAGNOSIS — R1310 Dysphagia, unspecified: Secondary | ICD-10-CM | POA: Diagnosis not present

## 2017-11-23 DIAGNOSIS — N183 Chronic kidney disease, stage 3 unspecified: Secondary | ICD-10-CM

## 2017-11-23 DIAGNOSIS — N400 Enlarged prostate without lower urinary tract symptoms: Secondary | ICD-10-CM | POA: Diagnosis not present

## 2017-11-23 NOTE — Assessment & Plan Note (Signed)
Continue current therapy, check A1C

## 2017-11-23 NOTE — Progress Notes (Signed)
Location:  Occupational psychologist of Service:  SNF (31) Provider:   Cindi Carbon, ANP [place] [phone]   Gayland Curry, DO  Patient Care Team: Gayland Curry, DO as PCP - General (Geriatric Medicine) [facility], [facility] [name]  Extended Emergency Contact Information Primary Emergency Contact: [facility] Address: [address]          [place], [place] [address] [name] of [place] Phone: [phone] Mobile Phone: [phone] Relation: Daughter  Code Status:  DNR Goals of care: Advanced Directive information Advanced Directives 10/24/2017  Does Patient Have a Medical Advance Directive? Yes  Type of Paramedic of [place];Living will;Out of facility DNR (pink MOST or yellow form)  Does patient want to make changes to medical advance directive? No - Patient declined  Copy of [place] in Chart? Yes  Pre-existing out of facility DNR order (yellow form or pink MOST form) Yellow form placed in chart (order not valid for inpatient use)     Chief Complaint  Patient presents with  . Medical Management of Chronic Issues    HPI:  Pt is a 84 y.o. male seen today for medical management of chronic diseases.   He resides in skilled care. There are no complaints regarding his health for the visit today.  AD/Vascular dementia: occasional reports of agitation and increased confusion in nsg notes but he is redirected fairly easily MMSE 19/30 on 06/16/17 with failed clock  DMII: Needs A1C, CBGs running 121-231 on Levemir  Seen by podiatry 11/07/17  BPH: currently on flomax, denies urinary issues but he is an overall poor historian  Dysphagia: current on D3 diet with chopped meats, no reports of coughing or choking  ACD: hx of GIB, on aspirin daily due to a hx of CVA CBC Latest Ref Rng & Units 05/03/2017 01/09/2017 07/01/2016  WBC - 7.5 8.1 8.2  Hemoglobin 13.5 - 17.5 10.2(A) 11.6(A)  11.3(A)  Hematocrit 41 - 53 32(A) 36(A) 33(A)  Platelets 150 - 399 219 187 168   Functional status: hoyer lift for transfers, incontinent of urine/stool   Past Medical History:  Diagnosis Date  . Anemia, iron deficiency 12/30/2012  . Basal cell carcinoma   . Bipolar 1 disorder ([place])   . CKD (chronic kidney disease), stage II 07/09/2012   07/16/12:   stage II chronic kidney disease by GFR.  Follow lab at interval    . Dementia   . Diabetes ([place])   . GI bleed   . Hypertension   . 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 Pt. is aware of deficit. Will benefit from [place] environment for assistance, supervision, socialization    07/16/12:  Admission MDS reviewed: BIMS score 13/15,  functional status: Pt requires limited to extensive assist with all ADLs except eating (supervision)    . Pressure ulcer of sacrum 12/07/2012  . Squamous cell carcinoma of skin of scalp 01/24/14   anterior and posterior scalp   . Stroke [facility]) 08/2009   Left side with residual deficit   Past Surgical History:  Procedure Laterality Date  . HIP ARTHROPLASTY Left 12/01/2012   Procedure: ARTHROPLASTY BIPOLAR HIP;  Surgeon: Johnn Hai, MD;  Location: [facility];  Service: Orthopedics;  Laterality: Left;  . SKIN LESION EXCISION  01/24/14   Anterior and posterio scalp Squamous cell [name] PA    No Known Allergies  Outpatient Encounter Medications as  of 11/23/2017  Medication Sig  . acetaminophen (TYLENOL) 325 MG tablet Take 650 mg by mouth every 6 (six) hours as needed for pain.  Marland Kitchen antiseptic oral rinse (BIOTENE) LIQD 15 mLs by Mouth Rinse route 4 (four) times daily.  Marland Kitchen aspirin 81 MG tablet Take 81 mg by mouth daily.  . cholecalciferol (VITAMIN D) 1000 UNITS tablet Take 1,000 Units by mouth every morning.  . clomiPRAMINE (ANAFRANIL) 25 MG capsule Take 1 capsule (25 mg total) by mouth at bedtime.  . docusate sodium (COLACE) 100 MG capsule  Take 100 mg by mouth daily.  Marland Kitchen donepezil (ARICEPT) 10 MG tablet Take 10 mg by mouth daily.  . Insulin Detemir (LEVEMIR FLEXTOUCH) 100 UNIT/ML Pen Inject 8 Units into the skin 2 (two) times daily. Hold for CBG <85   . ketoconazole (NIZORAL) 2 % shampoo Apply 1 application topically 2 (two) times a week.  . latanoprost (XALATAN) 0.005 % ophthalmic solution 1 drop at bedtime.  . metoprolol tartrate (LOPRESSOR) 25 MG tablet Take 75 mg by mouth 2 (two) times daily.   . Multiple Vitamins-Minerals (CENTRUM SILVER 50+MEN) TABS Take by mouth daily.  . QUEtiapine (SEROQUEL) 25 MG tablet Take 12.5 mg by mouth at bedtime.   . tamsulosin (FLOMAX) 0.4 MG CAPS Take 0.4 mg by mouth daily after breakfast.   No facility-administered encounter medications on file as of 11/23/2017.     Review of Systems  Constitutional: Negative for activity change, appetite change, chills, diaphoresis, fatigue, fever and unexpected weight change.  HENT: Negative for congestion.   Respiratory: Negative for cough, shortness of breath, wheezing and stridor.   Cardiovascular: Positive for leg swelling. Negative for chest pain and palpitations.  Gastrointestinal: Negative for abdominal distention, abdominal pain, constipation and diarrhea.  Genitourinary: Negative for difficulty urinating and dysuria.  Musculoskeletal: Positive for gait problem. Negative for arthralgias, back pain, joint swelling and myalgias.  Neurological: Positive for weakness (left sided). Negative for dizziness, seizures, syncope, facial asymmetry, speech difficulty and headaches.  Hematological: Negative for adenopathy. Does not bruise/bleed easily.  Psychiatric/Behavioral: Positive for agitation and confusion. Negative for behavioral problems.    Immunization History  Administered Date(s) Administered  . Influenza Inj Mdck Quad Pf 03/17/2016  . Influenza Whole 03/13/2013  . Influenza-Unspecified 03/04/2014, 03/12/2015, 03/23/2017  . PPD Test 07/05/2012    . Pneumococcal Conjugate-13 07/01/2016  . Pneumococcal Polysaccharide-23 07/16/1996  . Td 07/01/2016  . Zoster Recombinat (Shingrix) 06/30/2017   Pertinent  Health Maintenance Due  Topic Date Due  . HEMOGLOBIN A1C  11/01/2017  . INFLUENZA VACCINE  12/28/2017  . FOOT EXAM  06/01/2018  . URINE MICROALBUMIN  06/02/2018  . OPHTHALMOLOGY EXAM  09/28/2018  . PNA vac Low Risk Adult  Completed   Fall Risk  10/03/2017 09/28/2016 05/29/2015 03/16/2015 02/05/2015  Falls in the past year? No No No No No  Risk for fall due to : - - History of fall(s) - -   Functional Status Survey:    There were no vitals filed for this visit. There is no height or weight on file to calculate BMI. Physical Exam  Constitutional: No distress.  HENT:  Head: Normocephalic and atraumatic.  Nose: Nose normal.  Mouth/Throat: Oropharynx is clear and moist. No oropharyngeal exudate.  Neck: No JVD present. No tracheal deviation present. No thyromegaly present.  Cardiovascular: Normal rate and regular rhythm.  No murmur heard. Trace edema noted to left arm and left leg  Pulmonary/Chest: Effort normal and breath sounds normal. No respiratory distress. He  has no wheezes.  Abdominal: Soft. Bowel sounds are normal. He exhibits no distension. There is no tenderness.  Lymphadenopathy:    He has no cervical adenopathy.  Neurological: He is alert.  Left sided weakness due to previous CVA. Oriented to self only  Skin: Skin is warm and dry. He is not diaphoretic.  Psychiatric: He has a normal mood and affect.  Nursing note and vitals reviewed.   Labs reviewed: Recent Labs    01/09/17 05/03/17  NA 145 142  K 4.2 4.4  BUN 22* 22*  CREATININE 1.3 1.2   Recent Labs    05/03/17  AST 19  ALT 11  ALKPHOS 74   Recent Labs    01/09/17 05/03/17  WBC 8.1 7.5  HGB 11.6* 10.2*  HCT 36* 32*  PLT 187 219   Lab Results  Component Value Date   TSH 1.13 12/22/2015   Lab Results  Component Value Date   HGBA1C 6.3  05/03/2017   Lab Results  Component Value Date   CHOL 152 06/27/2013   HDL 55 06/27/2013   LDLCALC 78 06/27/2013   TRIG 83 06/27/2013    Significant Diagnostic Results in last 30 days:  No results found.  Assessment/Plan  Dysphagia Continue D3 diet and monitor s/s of aspiration  Diabetes mellitus with renal manifestation Continue current therapy, check A1C  CKD (chronic kidney disease) stage 3, GFR 30-59 ml/min Continue to periodically monitor BMP and avoid nephrotoxic agents   Anemia of chronic disease Hx of CKD and IDA per chart. Hgb drifting down, will recheck Also on seroquel and anafranil which require periodic drug therapy monitoring.   BPH (benign prostatic hyperplasia) Continue Flomax 0.4 mg qd   Family/ staff Communication: staff/resident  Labs/tests ordered:  CBC BMP A1C Needs diabetic eye exam1

## 2017-11-23 NOTE — Assessment & Plan Note (Signed)
Hx of CKD and IDA per chart. Hgb drifting down, will recheck Also on seroquel and anafranil which require periodic drug therapy monitoring.

## 2017-11-23 NOTE — Assessment & Plan Note (Signed)
Continue D3 diet and monitor s/s of aspiration

## 2017-11-23 NOTE — Assessment & Plan Note (Signed)
Continue to periodically monitor BMP and avoid nephrotoxic agents

## 2017-11-23 NOTE — Assessment & Plan Note (Addendum)
Continue Flomax 0.4 mg qd

## 2017-11-24 LAB — CBC AND DIFFERENTIAL
HCT: 34 — AB (ref 41–53)
HEMOGLOBIN: 11.7 — AB (ref 13.5–17.5)
Platelets: 189 (ref 150–399)
WBC: 7.7

## 2017-11-24 LAB — BASIC METABOLIC PANEL
BUN: 32 — AB (ref 4–21)
Creatinine: 1.2 (ref 0.6–1.3)
GLUCOSE: 181
POTASSIUM: 4.6 (ref 3.4–5.3)
SODIUM: 140 (ref 137–147)

## 2017-11-24 LAB — HEMOGLOBIN A1C: Hemoglobin A1C: 7.3

## 2017-11-29 ENCOUNTER — Other Ambulatory Visit: Payer: Self-pay | Admitting: *Deleted

## 2017-11-29 MED ORDER — QUETIAPINE FUMARATE 25 MG PO TABS: 12.5000 mg | ORAL_TABLET | Freq: Every day | ORAL | 3 refills | Status: AC

## 2017-12-15 ENCOUNTER — Non-Acute Institutional Stay (SKILLED_NURSING_FACILITY): Payer: Medicare Other | Admitting: Adult Health

## 2017-12-15 ENCOUNTER — Encounter: Payer: Self-pay | Admitting: Adult Health

## 2017-12-15 DIAGNOSIS — G309 Alzheimer's disease, unspecified: Secondary | ICD-10-CM | POA: Diagnosis not present

## 2017-12-15 DIAGNOSIS — I6990 Unspecified sequelae of unspecified cerebrovascular disease: Secondary | ICD-10-CM | POA: Diagnosis not present

## 2017-12-15 DIAGNOSIS — E1122 Type 2 diabetes mellitus with diabetic chronic kidney disease: Secondary | ICD-10-CM | POA: Diagnosis not present

## 2017-12-15 DIAGNOSIS — N183 Chronic kidney disease, stage 3 unspecified: Secondary | ICD-10-CM

## 2017-12-15 DIAGNOSIS — Z794 Long term (current) use of insulin: Secondary | ICD-10-CM

## 2017-12-15 DIAGNOSIS — F0280 Dementia in other diseases classified elsewhere without behavioral disturbance: Secondary | ICD-10-CM | POA: Diagnosis not present

## 2017-12-15 DIAGNOSIS — F3175 Bipolar disorder, in partial remission, most recent episode depressed: Secondary | ICD-10-CM

## 2017-12-15 DIAGNOSIS — I10 Essential (primary) hypertension: Secondary | ICD-10-CM

## 2017-12-15 DIAGNOSIS — F0150 Vascular dementia without behavioral disturbance: Secondary | ICD-10-CM | POA: Diagnosis not present

## 2017-12-15 NOTE — Progress Notes (Signed)
Location:  Occupational psychologist of Service:  SNF (31) Provider:  Cindi Carbon, ANP [place] [phone]  Gayland Curry, DO  Patient Care Team: Gayland Curry, DO as PCP - General (Geriatric Medicine) Fay Records, Well [name]  Extended Emergency Contact Information Primary Emergency Contact: [facility] Address: [address]          [place], [place] [address] [name] of [place] Phone: [phone] Mobile Phone: [phone] Relation: Daughter  Code Status:  DNR Goals of care: Advanced Directive information Advanced Directives 10/24/2017  Does Patient Have a Medical Advance Directive? Yes  Type of Paramedic of [place];Living will;Out of facility DNR (pink MOST or yellow form)  Does patient want to make changes to medical advance directive? No - Patient declined  Copy of [place] in Chart? Yes  Pre-existing out of facility DNR order (yellow form or pink MOST form) Yellow form placed in chart (order not valid for inpatient use)     Chief Complaint  Patient presents with  . Medical Management of Chronic Issues    HPI:  Pt is a 84 y.o. male seen today for medical management of chronic diseases.   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 MMSE 14/30 on 06/16/17.  DM II: on Levemir 8 units bid CBGs range 162-190 in the am and 229-310 in the evening Eats candy and carb filled snacks later in the day which raises cbgs  Lab Results  Component Value Date   HGBA1C 7.3 11/24/2017   Ckd:  Lab Results  Component Value Date   BUN 32 (A) 11/24/2017   Lab Results  Component Value Date   CREATININE 1.2 11/24/2017   HTN: controlled  Bipolar 1: no reports of agitation and lability in mood  Past Medical History:  Diagnosis Date  . Anemia, iron deficiency 12/30/2012  . Basal cell carcinoma    . Bipolar 1 disorder ([place])   . CKD (chronic kidney disease), stage II 07/09/2012   07/16/12:   stage II chronic kidney disease by GFR.  Follow lab at interval    . Dementia   . Diabetes ([place])   . GI bleed   . Hypertension   . 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 Pt. is aware of deficit. Will benefit from [place] environment for assistance, supervision, socialization    07/16/12:  Admission MDS reviewed: BIMS score 13/15,  functional status: Pt requires limited to extensive assist with all ADLs except eating (supervision)    . Pressure ulcer of sacrum 12/07/2012  . Squamous cell carcinoma of skin of scalp 01/24/14   anterior and posterior scalp   . Stroke [facility]) 08/2009   Left side with residual deficit   Past Surgical History:  Procedure Laterality Date  . HIP ARTHROPLASTY Left 12/01/2012   Procedure: ARTHROPLASTY BIPOLAR HIP;  Surgeon: Johnn Hai, MD;  Location: [facility];  Service: Orthopedics;  Laterality: Left;  . SKIN LESION EXCISION  01/24/14   Anterior and posterio scalp Squamous cell [name] PA    No Known Allergies  Outpatient Encounter Medications as of 12/15/2017  Medication Sig  . acetaminophen (TYLENOL) 325 MG tablet Take 650 mg by mouth every 6 (six) hours as needed for pain.  Marland Kitchen antiseptic oral rinse (BIOTENE) LIQD 15 mLs by Mouth Rinse route 4 (  four) times daily.  Marland Kitchen aspirin 81 MG tablet Take 81 mg by mouth daily.  . cholecalciferol (VITAMIN D) 1000 UNITS tablet Take 1,000 Units by mouth every morning.  . clomiPRAMINE (ANAFRANIL) 25 MG capsule Take 1 capsule (25 mg total) by mouth at bedtime.  . docusate sodium (COLACE) 100 MG capsule Take 100 mg by mouth daily.  Marland Kitchen donepezil (ARICEPT) 10 MG tablet Take 10 mg by mouth daily.  . Insulin Detemir (LEVEMIR FLEXTOUCH) 100 UNIT/ML Pen Inject 8 Units into the skin 2 (two) times daily. Hold for CBG <85   . ketoconazole (NIZORAL) 2 % shampoo Apply 1  application topically 2 (two) times a week.  . latanoprost (XALATAN) 0.005 % ophthalmic solution 1 drop at bedtime.  . metoprolol tartrate (LOPRESSOR) 25 MG tablet Take 75 mg by mouth 2 (two) times daily.   . Multiple Vitamins-Minerals (CENTRUM SILVER 50+MEN) TABS Take by mouth daily.  . QUEtiapine (SEROQUEL) 25 MG tablet Take 0.5 tablets (12.5 mg total) by mouth at bedtime.  . tamsulosin (FLOMAX) 0.4 MG CAPS Take 0.4 mg by mouth daily after breakfast.   No facility-administered encounter medications on file as of 12/15/2017.    Poor historian Review of Systems  Constitutional: Negative for activity change, appetite change, chills, diaphoresis, fatigue, fever and unexpected weight change.  HENT: Negative for congestion.   Respiratory: Negative for cough, shortness of breath, wheezing and stridor.   Cardiovascular: Negative for chest pain, palpitations and leg swelling.  Gastrointestinal: Negative for abdominal distention, abdominal pain, constipation and diarrhea.  Genitourinary: Negative for difficulty urinating and dysuria.       Incontinence  Musculoskeletal: Positive for gait problem. Negative for arthralgias, back pain, joint swelling and myalgias.  Neurological: Positive for weakness (left). Negative for dizziness, seizures, syncope, facial asymmetry, speech difficulty and headaches.  Hematological: Negative for adenopathy. Does not bruise/bleed easily.  Psychiatric/Behavioral: Positive for confusion. Negative for agitation and behavioral problems.    Immunization History  Administered Date(s) Administered  . Influenza Inj Mdck Quad Pf 03/17/2016  . Influenza Whole 03/13/2013  . Influenza-Unspecified 03/04/2014, 03/12/2015, 03/23/2017  . PPD Test 07/05/2012  . Pneumococcal Conjugate-13 07/01/2016  . Pneumococcal Polysaccharide-23 07/16/1996  . Td 07/01/2016  . Zoster Recombinat (Shingrix) 06/30/2017   Pertinent  Health Maintenance Due  Topic Date Due  . INFLUENZA VACCINE   12/28/2017  . HEMOGLOBIN A1C  05/26/2018  . FOOT EXAM  06/01/2018  . URINE MICROALBUMIN  06/02/2018  . OPHTHALMOLOGY EXAM  09/28/2018  . PNA vac Low Risk Adult  Completed   Fall Risk  10/03/2017 09/28/2016 05/29/2015 03/16/2015 02/05/2015  Falls in the past year? No No No No No  Risk for fall due to : - - History of fall(s) - -   Functional Status Survey:    Vitals:   12/15/17 1005  BP: 120/77  Pulse: 81  Resp: 16  Temp: 98.1 F (36.7 C)  SpO2: 99%  Weight: 161 lb 8 oz (73.3 kg)   Body mass index is 22.52 kg/m.  Wt Readings from Last 3 Encounters:  12/15/17 161 lb 8 oz (73.3 kg)  11/23/17 161 lb 8 oz (73.3 kg)  10/24/17 160 lb (72.6 kg)   Physical Exam  Constitutional: No distress.  HENT:  Head: Normocephalic and atraumatic.  Cardiovascular: Normal rate and regular rhythm.  No murmur heard. Trace edema to left ankle and left hand  Pulmonary/Chest: Effort normal and breath sounds normal.  Abdominal: Soft. Bowel sounds are normal. He exhibits no distension.  Neurological:  He is alert.  Left sided weakness  Skin: Skin is warm and dry. He is not diaphoretic.  Psychiatric: He has a normal mood and affect.  Nursing note and vitals reviewed.   Labs reviewed: Recent Labs    01/09/17 05/03/17 11/24/17  NA 145 142 140  K 4.2 4.4 4.6  BUN 22* 22* 32*  CREATININE 1.3 1.2 1.2   Recent Labs    05/03/17  AST 19  ALT 11  ALKPHOS 74   Recent Labs    01/09/17 05/03/17 11/24/17  WBC 8.1 7.5 7.7  HGB 11.6* 10.2* 11.7*  HCT 36* 32* 34*  PLT 187 219 189   Lab Results  Component Value Date   TSH 1.13 12/22/2015   Lab Results  Component Value Date   HGBA1C 7.3 11/24/2017   Lab Results  Component Value Date   CHOL 152 06/27/2013   HDL 55 06/27/2013   LDLCALC 78 06/27/2013   TRIG 83 06/27/2013    Significant Diagnostic Results in last 30 days:  No results found.  Assessment/Plan  1. Mixed Alzheimer's and vascular dementia Progressive decline in cognitive  testing, remains on aricept Likely would not benefit from additional medication due to skilled level of care  2. Essential hypertension Controlled Continue metoprolol 75 mg bid  3. Type 2 diabetes mellitus with stage 3 chronic kidney disease, with long-term current use of insulin (HCC) Continue to monitor A1C q 6 months. Goal A1C <8%. Continue levemir 8 units bid  4. CKD (chronic kidney disease) stage 3, GFR 30-59 ml/min (HCC) Continue to periodically monitor BMP and avoid nephrotoxic agents  5. Bipolar 1 disorder, depressed, partial remission ([place]) Continue anfranil and seroquel No further dose reductions due to previous failed attempts  6. Late effects of cerebrovascular disease Continue asa 81 mg     Family/ staff Communication: staff  Labs/tests ordered:  NA

## 2018-01-11 ENCOUNTER — Non-Acute Institutional Stay (SKILLED_NURSING_FACILITY): Payer: Medicare Other | Admitting: Adult Health

## 2018-01-11 ENCOUNTER — Encounter: Payer: Self-pay | Admitting: Adult Health

## 2018-01-11 DIAGNOSIS — I6990 Unspecified sequelae of unspecified cerebrovascular disease: Secondary | ICD-10-CM

## 2018-01-11 DIAGNOSIS — G309 Alzheimer's disease, unspecified: Secondary | ICD-10-CM | POA: Diagnosis not present

## 2018-01-11 DIAGNOSIS — F0150 Vascular dementia without behavioral disturbance: Secondary | ICD-10-CM

## 2018-01-11 DIAGNOSIS — Z794 Long term (current) use of insulin: Secondary | ICD-10-CM

## 2018-01-11 DIAGNOSIS — N183 Chronic kidney disease, stage 3 unspecified: Secondary | ICD-10-CM

## 2018-01-11 DIAGNOSIS — F0280 Dementia in other diseases classified elsewhere without behavioral disturbance: Secondary | ICD-10-CM

## 2018-01-11 DIAGNOSIS — F3177 Bipolar disorder, in partial remission, most recent episode mixed: Secondary | ICD-10-CM

## 2018-01-11 DIAGNOSIS — R1310 Dysphagia, unspecified: Secondary | ICD-10-CM | POA: Diagnosis not present

## 2018-01-11 DIAGNOSIS — E1122 Type 2 diabetes mellitus with diabetic chronic kidney disease: Secondary | ICD-10-CM | POA: Diagnosis not present

## 2018-01-11 DIAGNOSIS — E559 Vitamin D deficiency, unspecified: Secondary | ICD-10-CM

## 2018-01-11 NOTE — Assessment & Plan Note (Signed)
Continue to periodically monitor BMP and avoid nephrotoxic agents

## 2018-01-11 NOTE — Assessment & Plan Note (Signed)
Progressive decline in cognition and function. One episode of aggression noted. Continue to monitor and report/record behaviors.

## 2018-01-11 NOTE — Assessment & Plan Note (Signed)
Continue modified D3 diet and asp prec.

## 2018-01-11 NOTE — Assessment & Plan Note (Signed)
Continue Vit D supplementation

## 2018-01-11 NOTE — Assessment & Plan Note (Signed)
Continue baby aspirin

## 2018-01-11 NOTE — Assessment & Plan Note (Signed)
Continue levemir 8 units bid. Would not aggressively treat outlier elevated blood sugars due to his goals of care.  Continue to monitor A1C q 6 months. Goal A1C <8%.
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
## 2018-01-11 NOTE — Assessment & Plan Note (Signed)
Continue anafranil and seroquel. Monitor CBC annually.

## 2018-02-19 ENCOUNTER — Non-Acute Institutional Stay (SKILLED_NURSING_FACILITY): Payer: Medicare Other | Admitting: Adult Health

## 2018-02-19 DIAGNOSIS — I10 Essential (primary) hypertension: Secondary | ICD-10-CM

## 2018-02-19 DIAGNOSIS — R635 Abnormal weight gain: Secondary | ICD-10-CM | POA: Diagnosis not present

## 2018-02-19 DIAGNOSIS — G309 Alzheimer's disease, unspecified: Secondary | ICD-10-CM | POA: Diagnosis not present

## 2018-02-19 DIAGNOSIS — E1122 Type 2 diabetes mellitus with diabetic chronic kidney disease: Secondary | ICD-10-CM

## 2018-02-19 DIAGNOSIS — N183 Chronic kidney disease, stage 3 unspecified: Secondary | ICD-10-CM

## 2018-02-19 DIAGNOSIS — K5901 Slow transit constipation: Secondary | ICD-10-CM

## 2018-02-19 DIAGNOSIS — F0280 Dementia in other diseases classified elsewhere without behavioral disturbance: Secondary | ICD-10-CM

## 2018-02-19 DIAGNOSIS — Z794 Long term (current) use of insulin: Secondary | ICD-10-CM

## 2018-02-19 DIAGNOSIS — F0150 Vascular dementia without behavioral disturbance: Secondary | ICD-10-CM

## 2018-02-26 ENCOUNTER — Encounter: Payer: Self-pay | Admitting: Adult Health

## 2018-02-26 NOTE — Progress Notes (Signed)
Location:  Occupational psychologist of Service:  SNF (31) Provider:   Cindi Carbon, ANP [place] [phone]   Gayland Curry, DO  Patient Care Team: Gayland Curry, DO as PCP - General (Geriatric Medicine) Fay Records, Well [name]  Extended Emergency Contact Information Primary Emergency Contact: [facility] Address: [address]          [place], [place] [address] [name] of [place] Phone: [phone] Mobile Phone: [phone] Relation: Daughter  Code Status:  DNR Goals of care: Advanced Directive information Advanced Directives 10/24/2017  Does Patient Have a Medical Advance Directive? Yes  Type of Paramedic of [place];Living will;Out of facility DNR (pink MOST or yellow form)  Does patient want to make changes to medical advance directive? No - Patient declined  Copy of [place] in Chart? Yes  Pre-existing out of facility DNR order (yellow form or pink MOST form) Yellow form placed in chart (order not valid for inpatient use)     Chief Complaint  Patient presents with  . Medical Management of Chronic Issues    HPI:  Pt is a 84 y.o. male seen today for medical management of chronic diseases.   DM II: CBGS range 135-194 in the am and 206-258 in the pm He continues to eat sweets that his family brings him for QOL reasons Lab Results  Component Value Date   HGBA1C 7.3 11/24/2017   Weight gain: He has been eating more sweets and has gained 6 lbs. His nutritional supplement was discontinued for this reason.  Wt Readings from Last 3 Encounters:  02/26/18 177 lb 14.4 oz (80.7 kg)  01/11/18 161 lb 8 oz (73.3 kg)  12/15/17 161 lb 8 oz (73.3 kg)   Mixed AD/vascular dementia: remains able to participate in activities and self propel in the wheelchair but has become increasingly confused about his situation/time of day over the past year  HTN controlled  CKD Lab  Results  Component Value Date   BUN 32 (A) 11/24/2017   Lab Results  Component Value Date   CREATININE 1.2 11/24/2017     Past Medical History:  Diagnosis Date  . Anemia, iron deficiency 12/30/2012  . Basal cell carcinoma   . Bipolar 1 disorder ([place])   . CKD (chronic kidney disease), stage II 07/09/2012   07/16/12:   stage II chronic kidney disease by GFR.  Follow lab at interval    . Dementia   . Diabetes ([place])   . GI bleed   . Hypertension   . 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 Pt. is aware of deficit. Will benefit from [place] environment for assistance, supervision, socialization    07/16/12:  Admission MDS reviewed: BIMS score 13/15,  functional status: Pt requires limited to extensive assist with all ADLs except eating (supervision)    . Pressure ulcer of sacrum 12/07/2012  . Squamous cell carcinoma of skin of scalp 01/24/14   anterior and posterior scalp   . Stroke [facility]) 08/2009   Left side with residual deficit   Past Surgical History:  Procedure Laterality Date  . HIP ARTHROPLASTY Left 12/01/2012   Procedure: ARTHROPLASTY BIPOLAR HIP;  Surgeon: Johnn Hai, MD;  Location: [facility] ORS;  Service: Orthopedics;  Laterality: Left;  . SKIN LESION EXCISION  01/24/14   Anterior and posterio scalp Squamous cell [name] PA    No Known Allergies  Outpatient Encounter Medications as of 02/19/2018  Medication Sig  . acetaminophen (TYLENOL) 325 MG tablet Take 650 mg by mouth every 6 (six) hours as needed for pain.  Marland Kitchen antiseptic oral rinse (BIOTENE) LIQD 15 mLs by Mouth Rinse route 4 (four) times daily.  Marland Kitchen aspirin 81 MG tablet Take 81 mg by mouth daily.  . cholecalciferol (VITAMIN D) 1000 UNITS tablet Take 1,000 Units by mouth every morning.  . clomiPRAMINE (ANAFRANIL) 25 MG capsule Take 1 capsule (25 mg total) by mouth at bedtime.  . docusate sodium (COLACE) 100 MG capsule Take 100 mg by mouth daily.  Marland Kitchen  donepezil (ARICEPT) 10 MG tablet Take 10 mg by mouth daily.  . Insulin Detemir (LEVEMIR FLEXTOUCH) 100 UNIT/ML Pen Inject 10 Units into the skin 2 (two) times daily. Hold for CBG <85   . ketoconazole (NIZORAL) 2 % shampoo Apply 1 application topically 2 (two) times a week.  . latanoprost (XALATAN) 0.005 % ophthalmic solution 1 drop at bedtime.  . metoprolol tartrate (LOPRESSOR) 25 MG tablet Take 75 mg by mouth 2 (two) times daily.   . Multiple Vitamins-Minerals (CENTRUM SILVER 50+MEN) TABS Take by mouth daily.  . QUEtiapine (SEROQUEL) 25 MG tablet Take 0.5 tablets (12.5 mg total) by mouth at bedtime.  . tamsulosin (FLOMAX) 0.4 MG CAPS Take 0.4 mg by mouth daily after breakfast.  . [DISCONTINUED] feeding supplement (BOOST HIGH PROTEIN) LIQD Take 1 Container by mouth daily.   No facility-administered encounter medications on file as of 02/19/2018.     Review of Systems  Constitutional: Negative for activity change, appetite change, chills, diaphoresis, fatigue, fever and unexpected weight change.  Respiratory: Negative for cough, shortness of breath, wheezing and stridor.   Cardiovascular: Positive for leg swelling. Negative for chest pain and palpitations.  Gastrointestinal: Negative for abdominal distention, abdominal pain, constipation and diarrhea.  Genitourinary: Negative for difficulty urinating and dysuria.  Musculoskeletal: Positive for gait problem. Negative for arthralgias, back pain, joint swelling and myalgias.  Neurological: Positive for weakness. Negative for dizziness, seizures, syncope, facial asymmetry, speech difficulty and headaches.  Hematological: Negative for adenopathy. Does not bruise/bleed easily.  Psychiatric/Behavioral: Positive for confusion. Negative for agitation and behavioral problems.    Immunization History  Administered Date(s) Administered  . Influenza Inj Mdck Quad Pf 03/17/2016  . Influenza Whole 03/13/2013  . Influenza-Unspecified 03/04/2014,  03/12/2015, 03/23/2017  . PPD Test 07/05/2012  . Pneumococcal Conjugate-13 07/01/2016  . Pneumococcal Polysaccharide-23 07/16/1996  . Td 07/01/2016  . Zoster Recombinat (Shingrix) 06/30/2017   Pertinent  Health Maintenance Due  Topic Date Due  . INFLUENZA VACCINE  12/28/2017  . HEMOGLOBIN A1C  05/26/2018  . FOOT EXAM  06/01/2018  . URINE MICROALBUMIN  06/02/2018  . OPHTHALMOLOGY EXAM  09/28/2018  . PNA vac Low Risk Adult  Completed   Fall Risk  10/03/2017 09/28/2016 05/29/2015 03/16/2015 02/05/2015  Falls in the past year? No No No No No  Risk for fall due to : - - History of fall(s) - -   Functional Status Survey:    Vitals:   02/26/18 1603  Weight: 177 lb 14.4 oz (80.7 kg)   Body mass index is 24.81 kg/m. Physical Exam  Constitutional: No distress.  HENT:  Head: Normocephalic and atraumatic.  Mouth/Throat: No oropharyngeal exudate.  Neck: No JVD present. No tracheal deviation present. No thyromegaly present.  Cardiovascular: Normal rate and regular rhythm.  No murmur heard. Pulmonary/Chest: Effort normal and breath sounds normal. No respiratory distress. He has no wheezes.  Abdominal:  Soft. Bowel sounds are normal. He exhibits no distension. There is no tenderness.  Musculoskeletal: He exhibits edema (left ankle and left hand/arm).  Lymphadenopathy:    He has no cervical adenopathy.  Neurological: He is alert.  Left sided weakness due to previous CVA. Oriented to self and place but not time  Skin: Skin is warm and dry. He is not diaphoretic.  Psychiatric: He has a normal mood and affect.  Nursing note and vitals reviewed.   Labs reviewed: Recent Labs    05/03/17 11/24/17  NA 142 140  K 4.4 4.6  BUN 22* 32*  CREATININE 1.2 1.2   Recent Labs    05/03/17  AST 19  ALT 11  ALKPHOS 74   Recent Labs    05/03/17 11/24/17  WBC 7.5 7.7  HGB 10.2* 11.7*  HCT 32* 34*  PLT 219 189   Lab Results  Component Value Date   TSH 1.13 12/22/2015   Lab Results    Component Value Date   HGBA1C 7.3 11/24/2017   Lab Results  Component Value Date   CHOL 152 06/27/2013   HDL 55 06/27/2013   LDLCALC 78 06/27/2013   TRIG 83 06/27/2013    Significant Diagnostic Results in last 30 days:  No results found.  Assessment/Plan  1. Weight gain Check TSH Should reduce carb/sweet intake but for QOL if issues his family brings him these items  2. Essential hypertension Controlled Continue lopressor 75 mg bid  3. Mixed Alzheimer's and vascular dementia ([place]) Progressive decline in cognition but he likely benefits from the Aricept so will continue for now  4. Type 2 diabetes mellitus with stage 3 chronic kidney disease, with long-term current use of insulin (HCC) Improved glucose control with increased insulin dosing, will monitor A1C q 6 mo No aggressive care or treatment per family wishes  5. CKD (chronic kidney disease) stage 3, GFR 30-59 ml/min (HCC) Continue to periodically monitor BMP and avoid nephrotoxic agents   6. Slow transit constipation Controlled Continue Colace 100 mg qd  Family/ staff Communication: discussed with staff/resident  Labs/tests ordered:  TSH

## 2018-03-05 LAB — TSH: TSH: 1.67 (ref 0.41–5.90)

## 2018-03-22 ENCOUNTER — Encounter: Payer: Self-pay | Admitting: Adult Health

## 2018-03-22 ENCOUNTER — Non-Acute Institutional Stay (SKILLED_NURSING_FACILITY): Payer: Medicare Other | Admitting: Adult Health

## 2018-03-22 DIAGNOSIS — D638 Anemia in other chronic diseases classified elsewhere: Secondary | ICD-10-CM

## 2018-03-22 DIAGNOSIS — Z794 Long term (current) use of insulin: Secondary | ICD-10-CM

## 2018-03-22 DIAGNOSIS — N183 Chronic kidney disease, stage 3 unspecified: Secondary | ICD-10-CM

## 2018-03-22 DIAGNOSIS — N400 Enlarged prostate without lower urinary tract symptoms: Secondary | ICD-10-CM

## 2018-03-22 DIAGNOSIS — G309 Alzheimer's disease, unspecified: Secondary | ICD-10-CM

## 2018-03-22 DIAGNOSIS — E1122 Type 2 diabetes mellitus with diabetic chronic kidney disease: Secondary | ICD-10-CM

## 2018-03-22 DIAGNOSIS — I6990 Unspecified sequelae of unspecified cerebrovascular disease: Secondary | ICD-10-CM

## 2018-03-22 DIAGNOSIS — F0280 Dementia in other diseases classified elsewhere without behavioral disturbance: Secondary | ICD-10-CM

## 2018-03-22 DIAGNOSIS — F0150 Vascular dementia without behavioral disturbance: Secondary | ICD-10-CM

## 2018-03-22 NOTE — Progress Notes (Signed)
Location:  Occupational psychologist of Service:  SNF (31) Provider:   Cindi Carbon, ANP [place] [phone]   Gayland Curry, DO  Patient Care Team: Gayland Curry, DO as PCP - General (Geriatric Medicine) Fay Records, Well [name]  Extended Emergency Contact Information Primary Emergency Contact: [facility] Address: [address]          [place], [place] [address] [name] of [place] Phone: [phone] Mobile Phone: [phone] Relation: Daughter  Code Status:  DNR Goals of care: Advanced Directive information Advanced Directives 10/24/2017  Does Patient Have a Medical Advance Directive? Yes  Type of Paramedic of [place];Living will;Out of facility DNR (pink MOST or yellow form)  Does patient want to make changes to medical advance directive? No - Patient declined  Copy of [place] in Chart? Yes  Pre-existing out of facility DNR order (yellow form or pink MOST form) Yellow form placed in chart (order not valid for inpatient use)     Chief Complaint  Patient presents with  . Medical Management of Chronic Issues    HPI:  Pt is a 84 y.o. male seen today for medical management of chronic diseases.    CBGs range 137-210 in the am and 202-293 in the pm Lab Results  Component Value Date   HGBA1C 7.3 11/24/2017  He has lost 2 lbs from last month but overall had a weight gain due to increased intake.   Mixed AD/Vascular dementia: MMSE 14/30 05/2017 Able to self propel in the wheelchair, incontinent of urine/stool, continues to participate in activities. Staff note worsening confusion over time.   ACD:: Lab Results  Component Value Date   HGB 11.7 (A) 11/24/2017   CKD:  Lab Results  Component Value Date   BUN 32 (A) 11/24/2017   Lab Results  Component Value Date   CREATININE 1.2 11/24/2017   Cerebrovascular disease with prior history of CVA with left sided  weakness.   BPH: no reported issues with urination, on flomax   Past Medical History:  Diagnosis Date  . Anemia, iron deficiency 12/30/2012  . Basal cell carcinoma   . Bipolar 1 disorder ([place])   . CKD (chronic kidney disease), stage II 07/09/2012   07/16/12:   stage II chronic kidney disease by GFR.  Follow lab at interval    . Dementia ([place])   . Diabetes ([place])   . GI bleed   . Hypertension   . Mixed Alzheimer's and vascular dementia ([place]) 12/01/2012   06/07/12: STML re: CVA- contribute to debility/dependence. Pt. is aware of deficit. Will benefit from [place] environment for assistance, supervision, socialization    07/16/12:  Admission MDS reviewed: BIMS score 13/15,  functional status: Pt requires limited to extensive assist with all ADLs except eating (supervision)    . Pressure ulcer of sacrum 12/07/2012  . Squamous cell carcinoma of skin of scalp 01/24/14   anterior and posterior scalp   . Stroke [facility]) 08/2009   Left side with residual deficit   Past Surgical History:  Procedure Laterality Date  . HIP ARTHROPLASTY Left 12/01/2012   Procedure: ARTHROPLASTY BIPOLAR HIP;  Surgeon: Johnn Hai, MD;  Location: [facility];  Service: Orthopedics;  Laterality: Left;  . SKIN LESION EXCISION  01/24/14   Anterior and posterio scalp Squamous cell [name] PA    No Known Allergies  Outpatient Encounter Medications as of 03/22/2018  Medication Sig  . acetaminophen (TYLENOL) 325  MG tablet Take 650 mg by mouth every 6 (six) hours as needed for pain.  Marland Kitchen antiseptic oral rinse (BIOTENE) LIQD 15 mLs by Mouth Rinse route 4 (four) times daily.  Marland Kitchen aspirin 81 MG tablet Take 81 mg by mouth daily.  . cholecalciferol (VITAMIN D) 1000 UNITS tablet Take 1,000 Units by mouth every morning.  . clomiPRAMINE (ANAFRANIL) 25 MG capsule Take 1 capsule (25 mg total) by mouth at bedtime.  . docusate sodium (COLACE) 100 MG capsule Take 100 mg by mouth daily.  Marland Kitchen donepezil (ARICEPT) 10 MG tablet Take 10 mg by  mouth daily.  . Insulin Detemir (LEVEMIR FLEXTOUCH) 100 UNIT/ML Pen Inject 10 Units into the skin 2 (two) times daily. Hold for CBG <85   . ketoconazole (NIZORAL) 2 % shampoo Apply 1 application topically 2 (two) times a week.  . latanoprost (XALATAN) 0.005 % ophthalmic solution 1 drop at bedtime.  . metoprolol tartrate (LOPRESSOR) 25 MG tablet Take 75 mg by mouth 2 (two) times daily.   . Multiple Vitamins-Minerals (CENTRUM SILVER 50+MEN) TABS Take by mouth daily.  . QUEtiapine (SEROQUEL) 25 MG tablet Take 0.5 tablets (12.5 mg total) by mouth at bedtime.  . tamsulosin (FLOMAX) 0.4 MG CAPS Take 0.4 mg by mouth daily after breakfast.   No facility-administered encounter medications on file as of 03/22/2018.     Review of Systems  Constitutional: Negative for activity change, appetite change, chills, diaphoresis, fatigue, fever and unexpected weight change.  Respiratory: Negative for cough, shortness of breath, wheezing and stridor.   Cardiovascular: Positive for leg swelling. Negative for chest pain and palpitations.  Gastrointestinal: Negative for abdominal distention, abdominal pain, constipation and diarrhea.  Genitourinary: Negative for difficulty urinating and dysuria.  Musculoskeletal: Positive for gait problem. Negative for arthralgias, back pain, joint swelling and myalgias.  Neurological: Positive for weakness. Negative for dizziness, seizures, syncope, facial asymmetry, speech difficulty and headaches.  Hematological: Negative for adenopathy. Does not bruise/bleed easily.  Psychiatric/Behavioral: Positive for confusion. Negative for agitation and behavioral problems.    Immunization History  Administered Date(s) Administered  . Influenza Inj Mdck Quad Pf 03/17/2016  . Influenza Whole 03/13/2013  . Influenza-Unspecified 03/04/2014, 03/12/2015, 03/23/2017  . PPD Test 07/05/2012  . Pneumococcal Conjugate-13 07/01/2016  . Pneumococcal Polysaccharide-23 07/16/1996  . Td 07/01/2016    . Zoster Recombinat (Shingrix) 06/30/2017   Pertinent  Health Maintenance Due  Topic Date Due  . INFLUENZA VACCINE  12/28/2017  . HEMOGLOBIN A1C  05/26/2018  . FOOT EXAM  06/01/2018  . URINE MICROALBUMIN  06/02/2018  . OPHTHALMOLOGY EXAM  09/28/2018  . PNA vac Low Risk Adult  Completed   Fall Risk  10/03/2017 09/28/2016 05/29/2015 03/16/2015 02/05/2015  Falls in the past year? No No No No No  Risk for fall due to : - - History of fall(s) - -   Functional Status Survey:    Vitals:   03/22/18 1050  BP: 112/70  Pulse: 78  Weight: 175 lb 14.4 oz (79.8 kg)   Body mass index is 24.53 kg/m.  Wt Readings from Last 3 Encounters:  03/22/18 175 lb 14.4 oz (79.8 kg)  02/26/18 177 lb 14.4 oz (80.7 kg)  01/11/18 161 lb 8 oz (73.3 kg)   Physical Exam  Constitutional: No distress.  HENT:  Head: Normocephalic and atraumatic.  Neck: No JVD present. No tracheal deviation present. No thyromegaly present.  Cardiovascular: Normal rate and regular rhythm.  No murmur heard. Pulmonary/Chest: Effort normal and breath sounds normal. No respiratory distress.  He has no wheezes.  Abdominal: Soft. Bowel sounds are normal. He exhibits no distension. There is no tenderness.  Lymphadenopathy:    He has no cervical adenopathy.  Neurological: He is alert.  Oriented to self and place. Left sided weakness with facial droop  Skin: Skin is warm and dry. He is not diaphoretic.  Psychiatric: He has a normal mood and affect.  Nursing note and vitals reviewed.   Labs reviewed: Recent Labs    05/03/17 11/24/17  NA 142 140  K 4.4 4.6  BUN 22* 32*  CREATININE 1.2 1.2   Recent Labs    05/03/17  AST 19  ALT 11  ALKPHOS 74   Recent Labs    05/03/17 11/24/17  WBC 7.5 7.7  HGB 10.2* 11.7*  HCT 32* 34*  PLT 219 189   Lab Results  Component Value Date   TSH 1.13 12/22/2015   Lab Results  Component Value Date   HGBA1C 7.3 11/24/2017   Lab Results  Component Value Date   CHOL 152 06/27/2013    HDL 55 06/27/2013   LDLCALC 78 06/27/2013   TRIG 83 06/27/2013    Significant Diagnostic Results in last 30 days:  No results found.  Assessment/Plan  1. Mixed Alzheimer's and vascular dementia ([place]) Continue Aricept 5 mg qhs Noted progressive decline in cognition c/w the disease  2. Type 2 diabetes mellitus with stage 3 chronic kidney disease, with long-term current use of insulin (HCC) Improved blood sugars Continue Levemir 10 units BID  3. Anemia of chronic disease Recheck CBC in Dec  4. CKD (chronic kidney disease) stage 3, GFR 30-59 ml/min (HCC) Continue to periodically monitor BMP and avoid nephrotoxic agents  5. Benign prostatic hyperplasia without lower urinary tract symptoms Continue Flomax 0.4 mg daily  6. Late effects of cerebrovascular disease Has residual left sided weakness associated with CVA. BP controlled No longer checking lipids due to lack of benefit Continue baby aspirin      Family/ staff Communication: staff/resident  Labs/tests ordered:  NA

## 2018-04-17 ENCOUNTER — Non-Acute Institutional Stay (SKILLED_NURSING_FACILITY): Payer: Medicare Other | Admitting: Internal Medicine

## 2018-04-17 ENCOUNTER — Encounter: Payer: Self-pay | Admitting: Internal Medicine

## 2018-04-17 DIAGNOSIS — F0280 Dementia in other diseases classified elsewhere without behavioral disturbance: Secondary | ICD-10-CM

## 2018-04-17 DIAGNOSIS — N183 Chronic kidney disease, stage 3 unspecified: Secondary | ICD-10-CM

## 2018-04-17 DIAGNOSIS — F3176 Bipolar disorder, in full remission, most recent episode depressed: Secondary | ICD-10-CM

## 2018-04-17 DIAGNOSIS — Z794 Long term (current) use of insulin: Secondary | ICD-10-CM

## 2018-04-17 DIAGNOSIS — G309 Alzheimer's disease, unspecified: Secondary | ICD-10-CM

## 2018-04-17 DIAGNOSIS — I10 Essential (primary) hypertension: Secondary | ICD-10-CM | POA: Diagnosis not present

## 2018-04-17 DIAGNOSIS — E1122 Type 2 diabetes mellitus with diabetic chronic kidney disease: Secondary | ICD-10-CM

## 2018-04-17 DIAGNOSIS — F0150 Vascular dementia without behavioral disturbance: Secondary | ICD-10-CM

## 2018-04-17 DIAGNOSIS — R3981 Functional urinary incontinence: Secondary | ICD-10-CM

## 2018-04-17 NOTE — Progress Notes (Signed)
Patient ID: George Kemp, male   DOB: 1927-03-24, 84 y.o.   MRN: [id number]  Location:  [employer] Room Number: 182 Place of Service:  SNF ([phone]) Provider:  Gayland Curry, DO  Patient Care Team: Gayland Curry, DO as PCP - General (Geriatric Medicine) [name]  Extended Emergency Contact Information Primary Emergency Contact: [facility] Address: [address]          [place], [place] [address] [name] of [place] Phone: [phone] Mobile Phone: [phone] Relation: Daughter  Code Status:  DNR Goals of care: Advanced Directive information Advanced Directives 04/17/2018  Does Patient Have a Medical Advance Directive? Yes  Type of Advance Directive Out of facility DNR (pink MOST or yellow form);[place];Living will  Does patient want to make changes to medical advance directive? No - Patient declined  Copy of [place] in Chart? Yes - validated most recent copy scanned in chart (See row information)  Pre-existing out of facility DNR order (yellow form or pink MOST form) Yellow form placed in chart (order not valid for inpatient use)     Chief Complaint  Patient presents with  . Medical Management of Chronic Issues    routine visit    HPI:  Pt is a 84 y.o. male seen today for medical management of chronic diseases.    He reports feeling good. Nursing's only new report was a large loose bm this am, but she also indicated this happens at times.  He denied pain and showed no signs of abdominal discomfort during the visit.  I had reviewed his vaccinations and noted he'd not had his shingrix vaccines.  Upon further investigation, nursing indicated that his daughter, George Kemp, had refused these vaccines for him.  No rationale was documented.    Pts bipolar disorder is in remission with his current meds:  seroquel and clomipramine.  Previous attempts at dose reduction have  been unsuccessful due to recurrence of behaviors and depressive symptoms.  His family has requested that his meds be left alone b/c he's doing just fine on them.  His dementia is progressing.  His speech is less clear this visit than the last a few months ago.  He is dependent on staff for assistance with his adls in SNF.  Last mmse 14/30 in Jan.  Still self-propels manual w/c.  Weights:  Stable the last few months.  Oddly, weight in august was 161.5 lbs but Sept was 177.9.  He's now been 175 the past two months.  His family brings him snacks and candy that he enjoys for his quality of life. CBGs go up after meals in the evening into the 200s, but  Lab Results  Component Value Date   HGBA1C 7.3 11/24/2017  is at goal  Left sided weakness from stroke persists and dysarthria.Marland Kitchen     He remains incontinent of urine due to his immobility.  Past Medical History:  Diagnosis Date  . Anemia, iron deficiency 12/30/2012  . Basal cell carcinoma   . Bipolar 1 disorder ([place])   . CKD (chronic kidney disease), stage II 07/09/2012   07/16/12:   stage II chronic kidney disease by GFR.  Follow lab at interval    . Dementia ([place])   . Diabetes ([place])   . GI bleed   . Hypertension   . Mixed Alzheimer's and vascular dementia ([place]) 12/01/2012   06/07/12: STML re: CVA- contribute to debility/dependence. Pt. is aware of deficit.  Will benefit from [place] environment for assistance, supervision, socialization    07/16/12:  Admission MDS reviewed: BIMS score 13/15,  functional status: Pt requires limited to extensive assist with all ADLs except eating (supervision)    . Pressure ulcer of sacrum 12/07/2012  . Squamous cell carcinoma of skin of scalp 01/24/14   anterior and posterior scalp   . Stroke [facility]) 08/2009   Left side with residual deficit   Past Surgical History:  Procedure Laterality Date  . HIP ARTHROPLASTY Left 12/01/2012   Procedure: ARTHROPLASTY BIPOLAR HIP;  Surgeon: Johnn Hai, MD;  Location: [facility]  ORS;  Service: Orthopedics;  Laterality: Left;  . SKIN LESION EXCISION  01/24/14   Anterior and posterio scalp Squamous cell [name] PA    No Known Allergies  Outpatient Encounter Medications as of 04/17/2018  Medication Sig  . acetaminophen (TYLENOL) 325 MG tablet Take 650 mg by mouth every 6 (six) hours as needed for pain.  Marland Kitchen antiseptic oral rinse (BIOTENE) LIQD 15 mLs by Mouth Rinse route 4 (four) times daily.  Marland Kitchen aspirin 81 MG tablet Take 81 mg by mouth daily.  . cholecalciferol (VITAMIN D) 1000 UNITS tablet Take 1,000 Units by mouth every morning.  . clomiPRAMINE (ANAFRANIL) 25 MG capsule Take 1 capsule (25 mg total) by mouth at bedtime.  . docusate sodium (COLACE) 100 MG capsule Take 100 mg by mouth daily.  Marland Kitchen donepezil (ARICEPT) 10 MG tablet Take 10 mg by mouth daily.  . Insulin Detemir (LEVEMIR FLEXTOUCH) 100 UNIT/ML Pen Inject 10 Units into the skin 2 (two) times daily. Hold for CBG <85   . ketoconazole (NIZORAL) 2 % shampoo Apply 1 application topically 2 (two) times a week.  . latanoprost (XALATAN) 0.005 % ophthalmic solution 1 drop at bedtime.  . metoprolol tartrate (LOPRESSOR) 25 MG tablet Take 75 mg by mouth 2 (two) times daily.   . Multiple Vitamins-Minerals (CENTRUM SILVER 50+MEN) TABS Take by mouth daily.  . QUEtiapine (SEROQUEL) 25 MG tablet Take 0.5 tablets (12.5 mg total) by mouth at bedtime.  . tamsulosin (FLOMAX) 0.4 MG CAPS Take 0.4 mg by mouth daily after breakfast.   No facility-administered encounter medications on file as of 04/17/2018.     Review of Systems  Constitutional: Negative for activity change, appetite change, chills and fever.  HENT: Positive for trouble swallowing. Negative for congestion.   Eyes: Negative for visual disturbance.       Glasses  Respiratory: Negative for chest tightness and shortness of breath.   Cardiovascular: Negative for chest pain and leg swelling.  Gastrointestinal: Negative for abdominal distention, abdominal pain,  constipation, diarrhea and nausea.  Genitourinary: Negative for dysuria.       U incontinence  Musculoskeletal: Positive for gait problem. Negative for arthralgias, back pain and joint swelling.  Skin: Negative for color change.  Neurological: Positive for speech difficulty, weakness and numbness.  Psychiatric/Behavioral: Positive for confusion. Negative for agitation, behavioral problems and sleep disturbance. The patient is not nervous/anxious.     Immunization History  Administered Date(s) Administered  . Influenza Inj Mdck Quad Pf 03/17/2016  . Influenza Whole 03/13/2013  . Influenza,inj,Quad PF,6+ Mos 03/20/2018  . Influenza-Unspecified 03/04/2014, 03/12/2015, 03/23/2017  . PPD Test 07/05/2012  . Pneumococcal Conjugate-13 07/01/2016  . Pneumococcal Polysaccharide-23 07/16/1996  . Td 07/01/2016  . Zoster Recombinat (Shingrix) 06/30/2017   Pertinent  Health Maintenance Due  Topic Date Due  . HEMOGLOBIN A1C  05/26/2018  . FOOT EXAM  06/01/2018  . URINE MICROALBUMIN  06/02/2018  . OPHTHALMOLOGY EXAM  09/28/2018  . INFLUENZA VACCINE  Completed  . PNA vac Low Risk Adult  Completed   Fall Risk  10/03/2017 09/28/2016 05/29/2015 03/16/2015 02/05/2015  Falls in the past year? No No No No No  Risk for fall due to : - - History of fall(s) - -   Functional Status Survey:    Vitals:   04/17/18 1436  BP: 127/79  Pulse: 88  Resp: 20  Temp: (!) 97.4 F (36.3 C)  TempSrc: Oral  SpO2: 96%  Weight: 175 lb (79.4 kg)  Height: 5\' 11"  (1.803 m)   Body mass index is 24.41 kg/m. Physical Exam  Constitutional: He appears well-developed and well-nourished. No distress.  Resting in bed for afternoon nap, but awake when I entered  Cardiovascular: Normal rate, regular rhythm, normal heart sounds and intact distal pulses.  Pulmonary/Chest: Effort normal and breath sounds normal. No respiratory distress.  Abdominal: Soft. Bowel sounds are normal. He exhibits no distension and no mass. There is  no tenderness. There is no guarding.  Musculoskeletal: He exhibits no tenderness.  Neurological: He is alert.  Left hemiparesis s/p cva; dysarthric speech  Skin: Skin is warm and dry.  Psychiatric: He has a normal mood and affect.    Labs reviewed: Recent Labs    05/03/17 11/24/17  NA 142 140  K 4.4 4.6  BUN 22* 32*  CREATININE 1.2 1.2   Recent Labs    05/03/17  AST 19  ALT 11  ALKPHOS 74   Recent Labs    05/03/17 11/24/17  WBC 7.5 7.7  HGB 10.2* 11.7*  HCT 32* 34*  PLT 219 189   Lab Results  Component Value Date   TSH 1.67 03/05/2018   Lab Results  Component Value Date   HGBA1C 7.3 11/24/2017   Lab Results  Component Value Date   CHOL 152 06/27/2013   HDL 55 06/27/2013   LDLCALC 78 06/27/2013   TRIG 83 06/27/2013   Assessment/Plan 1. Mixed Alzheimer's and vascular dementia ([place]) -gradually progressing with cognitive decline -cont supportive care from SNF nursing staff -continues aricept  2. Essential hypertension -bp at goal, cont lopressor bid  3. Type 2 diabetes mellitus with stage 3 chronic kidney disease, with long-term current use of insulin (HCC) -glucose control good in the summer, is due for second hba1c of the year end of December -cont levemir 10 units bid and just bid fingersticks  -attempts at control with orals were unsuccessful and he does eat well and enjoys candy so insulin has been necessary   4. CKD (chronic kidney disease) stage 3, GFR 30-59 ml/min (HCC) -kidneys stable in June also, recheck these with his hba1c next month  5. Bipolar 1 disorder, depressed, full remission ([place]) -stable and in remission with his seroquel and anafranil, no changes  6. Urinary incontinence due to immobility -ongoing, getting appropriate incontinence care from CNAs  Family/ staff Communication: discussed with SNF nurse  Labs/tests ordered:  Will need hba1c, cbc, bmp in Dec  Aayushi Solorzano L. Clayten Allcock, D.O. [facility] [address], [place] [address] Cell Phone (Mon-Fri 8am-5pm):  [phone] On Call:  [phone] & follow prompts after 5pm & weekends Office Phone:  [phone] Office Fax:  [phone]

## 2018-05-10 ENCOUNTER — Non-Acute Institutional Stay (SKILLED_NURSING_FACILITY): Payer: Medicare Other | Admitting: Adult Health

## 2018-05-10 ENCOUNTER — Encounter: Payer: Self-pay | Admitting: Adult Health

## 2018-05-10 DIAGNOSIS — E1122 Type 2 diabetes mellitus with diabetic chronic kidney disease: Secondary | ICD-10-CM | POA: Diagnosis not present

## 2018-05-10 DIAGNOSIS — Z794 Long term (current) use of insulin: Secondary | ICD-10-CM

## 2018-05-10 DIAGNOSIS — G309 Alzheimer's disease, unspecified: Secondary | ICD-10-CM

## 2018-05-10 DIAGNOSIS — N183 Chronic kidney disease, stage 3 unspecified: Secondary | ICD-10-CM

## 2018-05-10 DIAGNOSIS — E559 Vitamin D deficiency, unspecified: Secondary | ICD-10-CM

## 2018-05-10 DIAGNOSIS — F0150 Vascular dementia without behavioral disturbance: Secondary | ICD-10-CM

## 2018-05-10 DIAGNOSIS — D638 Anemia in other chronic diseases classified elsewhere: Secondary | ICD-10-CM

## 2018-05-10 DIAGNOSIS — I10 Essential (primary) hypertension: Secondary | ICD-10-CM

## 2018-05-10 DIAGNOSIS — F0280 Dementia in other diseases classified elsewhere without behavioral disturbance: Secondary | ICD-10-CM

## 2018-05-10 NOTE — Progress Notes (Signed)
Location:  Occupational psychologist of Service:  SNF (31) Provider:   Cindi Carbon, ANP [place] [phone]  Gayland Curry, DO  Patient Care Team: Gayland Curry, DO as PCP - General (Geriatric Medicine) [facility]  Extended Emergency Contact Information Primary Emergency Contact: [facility] Address: [address]          [place], [place] [address] [name] of [place] Phone: [phone] Mobile Phone: [phone] Relation: Daughter  Code Status:   DNRGoals of care: Advanced Directive information Advanced Directives 04/17/2018  Does Patient Have a Medical Advance Directive? Yes  Type of Advance Directive Out of facility DNR (pink MOST or yellow form);[place];Living will  Does patient want to make changes to medical advance directive? No - Patient declined  Copy of [place] in Chart? Yes - validated most recent copy scanned in chart (See row information)  Pre-existing out of facility DNR order (yellow form or pink MOST form) Yellow form placed in chart (order not valid for inpatient use)     Chief Complaint  Patient presents with  . Medical Management of Chronic Issues    HPI:  Pt is a 84 y.o. male seen today for medical management of chronic diseases.    CVA with residual left sided weakness AD/vascular dementia: self propels slowly in wheelchair, participates in bingo, able to communicate but overall poor historian  DM II: CBGS running over 400 last week due to increased intake. Levemir adjusted on 12/7 CBGs range 157-215 in the am and 268-277 in the pm  ACD: unchanged  Lab Results  Component Value Date   HGB 11.7 (A) 11/24/2017   CKD III Lab Results  Component Value Date   BUN 32 (A) 11/24/2017   Lab Results  Component Value Date   CREATININE 1.2 11/24/2017     Past Medical History:  Diagnosis Date  . Anemia, iron deficiency 12/30/2012    . Basal cell carcinoma   . Bipolar 1 disorder ([place])   . CKD (chronic kidney disease), stage II 07/09/2012   07/16/12:   stage II chronic kidney disease by GFR.  Follow lab at interval    . Dementia ([place])   . Diabetes ([place])   . GI bleed   . Hypertension   . Mixed Alzheimer's and vascular dementia ([place]) 12/01/2012   06/07/12: STML re: CVA- contribute to debility/dependence. Pt. is aware of deficit. Will benefit from [place] environment for assistance, supervision, socialization    07/16/12:  Admission MDS reviewed: BIMS score 13/15,  functional status: Pt requires limited to extensive assist with all ADLs except eating (supervision)    . Pressure ulcer of sacrum 12/07/2012  . Squamous cell carcinoma of skin of scalp 01/24/14   anterior and posterior scalp   . Stroke [facility]) 08/2009   Left side with residual deficit   Past Surgical History:  Procedure Laterality Date  . HIP ARTHROPLASTY Left 12/01/2012   Procedure: ARTHROPLASTY BIPOLAR HIP;  Surgeon: Johnn Hai, MD;  Location: [facility] ORS;  Service: Orthopedics;  Laterality: Left;  . SKIN LESION EXCISION  01/24/14   Anterior and posterio scalp Squamous cell [name] PA    No Known Allergies  Outpatient Encounter Medications as of 05/10/2018  Medication Sig  . acetaminophen (TYLENOL) 325 MG tablet Take 650 mg by mouth every 6 (six) hours as needed for pain.  Marland Kitchen antiseptic oral rinse (BIOTENE) LIQD 15 mLs by Mouth Rinse route  4 (four) times daily.  Marland Kitchen aspirin 81 MG tablet Take 81 mg by mouth daily.  . cholecalciferol (VITAMIN D) 1000 UNITS tablet Take 1,000 Units by mouth every morning.  . clomiPRAMINE (ANAFRANIL) 25 MG capsule Take 1 capsule (25 mg total) by mouth at bedtime.  . docusate sodium (COLACE) 100 MG capsule Take 100 mg by mouth daily.  Marland Kitchen donepezil (ARICEPT) 10 MG tablet Take 10 mg by mouth daily.  . Insulin Detemir (LEVEMIR FLEXTOUCH) 100 UNIT/ML Pen Inject 12 Units into the skin 2 (two) times daily. Hold for CBG <85  12 units  in the am and 15 units in the pm  . ketoconazole (NIZORAL) 2 % shampoo Apply 1 application topically 2 (two) times a week.  . latanoprost (XALATAN) 0.005 % ophthalmic solution 1 drop at bedtime.  . metoprolol tartrate (LOPRESSOR) 25 MG tablet Take 75 mg by mouth 2 (two) times daily.   . Multiple Vitamins-Minerals (CENTRUM SILVER 50+MEN) TABS Take by mouth daily.  . QUEtiapine (SEROQUEL) 25 MG tablet Take 0.5 tablets (12.5 mg total) by mouth at bedtime.  . tamsulosin (FLOMAX) 0.4 MG CAPS Take 0.4 mg by mouth daily after breakfast.   No facility-administered encounter medications on file as of 05/10/2018.     Review of Systems  Constitutional: Negative for activity change, appetite change, chills, diaphoresis, fatigue, fever and unexpected weight change.  Respiratory: Negative for cough, shortness of breath, wheezing and stridor.   Cardiovascular: Positive for leg swelling. Negative for chest pain and palpitations.  Gastrointestinal: Negative for abdominal distention, abdominal pain, constipation and diarrhea.  Genitourinary: Negative for difficulty urinating and dysuria.  Musculoskeletal: Positive for gait problem. Negative for arthralgias, back pain, joint swelling and myalgias.  Neurological: Positive for weakness. Negative for dizziness, seizures, syncope, facial asymmetry, speech difficulty and headaches.  Hematological: Negative for adenopathy. Does not bruise/bleed easily.  Psychiatric/Behavioral: Positive for confusion. Negative for agitation and behavioral problems.    Immunization History  Administered Date(s) Administered  . Influenza Inj Mdck Quad Pf 03/17/2016  . Influenza Whole 03/13/2013  . Influenza,inj,Quad PF,6+ Mos 03/20/2018  . Influenza-Unspecified 03/04/2014, 03/12/2015, 03/23/2017  . PPD Test 07/05/2012  . Pneumococcal Conjugate-13 07/01/2016  . Pneumococcal Polysaccharide-23 07/16/1996  . Td 07/01/2016   Pertinent  Health Maintenance Due  Topic Date Due  .  HEMOGLOBIN A1C  05/26/2018  . FOOT EXAM  06/01/2018  . URINE MICROALBUMIN  06/02/2018  . OPHTHALMOLOGY EXAM  09/28/2018  . INFLUENZA VACCINE  Completed  . PNA vac Low Risk Adult  Completed   Fall Risk  04/28/2018 10/03/2017 09/28/2016 05/29/2015 03/16/2015  Falls in the past year? Exclusion - non ambulatory No No No No  Risk for fall due to : Impaired mobility - - History of fall(s) -  Follow up Education provided - - - -   Functional Status Survey:    Vitals:   05/10/18 1515  BP: 117/87  Pulse: 84  Resp: (!) 22  Temp: 98.4 F (36.9 C)  SpO2: 98%  Weight: 174 lb 4.8 oz (79.1 kg)   Body mass index is 24.31 kg/m.  Wt Readings from Last 3 Encounters:  05/10/18 174 lb 4.8 oz (79.1 kg)  04/17/18 175 lb (79.4 kg)  03/22/18 175 lb 14.4 oz (79.8 kg)    Physical Exam Vitals signs and nursing note reviewed.  Constitutional:      General: He is not in acute distress.    Appearance: He is not diaphoretic.  HENT:     Head: Normocephalic and  atraumatic.     Right Ear: Tympanic membrane, ear canal and external ear normal.     Left Ear: Ear canal and external ear normal.     Nose: Nose normal. No congestion or rhinorrhea.     Mouth/Throat:     Mouth: Mucous membranes are moist.     Pharynx: Oropharynx is clear. No oropharyngeal exudate.  Eyes:     General:        Right eye: No discharge.        Left eye: No discharge.     Conjunctiva/sclera: Conjunctivae normal.     Pupils: Pupils are equal, round, and reactive to light.  Neck:     Musculoskeletal: No muscular tenderness.     Thyroid: No thyromegaly.     Vascular: No JVD.     Trachea: No tracheal deviation.  Cardiovascular:     Rate and Rhythm: Normal rate and regular rhythm.     Heart sounds: No murmur.  Pulmonary:     Effort: Pulmonary effort is normal. No respiratory distress.     Breath sounds: Normal breath sounds. No wheezing.  Abdominal:     General: Bowel sounds are normal. There is no distension.     Palpations:  Abdomen is soft.     Tenderness: There is no abdominal tenderness.  Musculoskeletal:        General: No tenderness or signs of injury.     Right lower leg: No edema.     Left lower leg: Edema present.     Comments: Left hand edema  Lymphadenopathy:     Cervical: No cervical adenopathy.  Skin:    General: Skin is warm and dry.  Neurological:     Mental Status: He is alert. Mental status is at baseline.     Cranial Nerves: No cranial nerve deficit.     Comments: Left sided weakness associated with CVA  Psychiatric:        Mood and Affect: Mood normal.     Labs reviewed: Recent Labs    11/24/17  NA 140  K 4.6  BUN 32*  CREATININE 1.2   No results for input(s): AST, ALT, ALKPHOS, BILITOT, PROT, ALBUMIN in the last 8760 hours. Recent Labs    11/24/17  WBC 7.7  HGB 11.7*  HCT 34*  PLT 189   Lab Results  Component Value Date   TSH 1.67 03/05/2018   Lab Results  Component Value Date   HGBA1C 7.3 11/24/2017   Lab Results  Component Value Date   CHOL 152 06/27/2013   HDL 55 06/27/2013   LDLCALC 78 06/27/2013   TRIG 83 06/27/2013    Significant Diagnostic Results in last 30 days:  No results found.  Assessment/Plan  1. Type 2 diabetes mellitus with stage 3 chronic kidney disease, with long-term current use of insulin (HCC) Continue Levemir 12 units in the am and 15 units in the pm A1C with next visit (insulin just adjusted)  2. CKD (chronic kidney disease) stage 3, GFR 30-59 ml/min (HCC) Continue to periodically monitor BMP and avoid nephrotoxic agents  3. Anemia of chronic disease Continue to monitor H/H  4. Vitamin D deficiency Continue Vit D supplementation  5. Mixed Alzheimer's and vascular dementia ([place]) Progressive decline in cognition and MMSE scores.  Continue aricept  6. Essential hypertension Controlled Continue metoprolol 75 mg BID Not on an ace at this point due to his health status and goals of care.   Family/ staff Communication:  staff/resident  Labs/tests  ordered: Will check BMP and A1C with next visit.

## 2018-06-04 ENCOUNTER — Non-Acute Institutional Stay (SKILLED_NURSING_FACILITY): Payer: Medicare Other | Admitting: Adult Health

## 2018-06-04 DIAGNOSIS — R339 Retention of urine, unspecified: Secondary | ICD-10-CM

## 2018-06-04 DIAGNOSIS — F0280 Dementia in other diseases classified elsewhere without behavioral disturbance: Secondary | ICD-10-CM

## 2018-06-04 DIAGNOSIS — G309 Alzheimer's disease, unspecified: Secondary | ICD-10-CM

## 2018-06-04 DIAGNOSIS — K5901 Slow transit constipation: Secondary | ICD-10-CM

## 2018-06-04 DIAGNOSIS — S0003XA Contusion of scalp, initial encounter: Secondary | ICD-10-CM | POA: Diagnosis not present

## 2018-06-04 DIAGNOSIS — N183 Chronic kidney disease, stage 3 unspecified: Secondary | ICD-10-CM

## 2018-06-04 DIAGNOSIS — I10 Essential (primary) hypertension: Secondary | ICD-10-CM

## 2018-06-04 DIAGNOSIS — W19XXXA Unspecified fall, initial encounter: Secondary | ICD-10-CM | POA: Diagnosis not present

## 2018-06-04 DIAGNOSIS — F0150 Vascular dementia without behavioral disturbance: Secondary | ICD-10-CM

## 2018-06-04 DIAGNOSIS — E1122 Type 2 diabetes mellitus with diabetic chronic kidney disease: Secondary | ICD-10-CM

## 2018-06-04 DIAGNOSIS — Z794 Long term (current) use of insulin: Secondary | ICD-10-CM

## 2018-06-05 LAB — HEMOGLOBIN A1C: Hemoglobin A1C: 7.8

## 2018-06-05 LAB — BASIC METABOLIC PANEL
BUN: 23 — AB (ref 4–21)
Creatinine: 1.2 (ref 0.6–1.3)
Glucose: 270
Potassium: 4.9 (ref 3.4–5.3)
Sodium: 142 (ref 137–147)

## 2018-06-06 ENCOUNTER — Encounter: Payer: Self-pay | Admitting: Adult Health

## 2018-06-06 NOTE — Progress Notes (Signed)
Location:  Occupational psychologist of Service:  SNF (31) Provider:   Cindi Carbon, ANP [place] [phone]  Gayland Curry, DO  Patient Care Team: Gayland Curry, DO as PCP - General (Geriatric Medicine) Fay Records, Well [facility]  Extended Emergency Contact Information Primary Emergency Contact: [facility] Address: [place], [place] [address] [name] of [place] Phone: [phone] Mobile Phone: [phone] Relation: Daughter  Code Status:   DNRGoals of care: Advanced Directive information Advanced Directives 04/17/2018  Does Patient Have a Medical Advance Directive? Yes  Type of Advance Directive Out of facility DNR (pink MOST or yellow form);[place];Living will  Does patient want to make changes to medical advance directive? No - Patient declined  Copy of [place] in Chart? Yes - validated most recent copy scanned in chart (See row information)  Pre-existing out of facility DNR order (yellow form or pink MOST form) Yellow form placed in chart (order not valid for inpatient use)     Chief Complaint  Patient presents with  . Medical Management of Chronic Issues    HPI:  Pt is a 85 y.o. male seen today for medical management of chronic diseases.    The nurse reports that he has not voided all day but has no pain or discomfort. He has a hx of BPH and is on flomax.   He was accidentally lifted out of the shower chair and hit the floor. No issues with neuro deficit or pain. He does have a small hematoma to the back of his head. He has a hx of CVA with residual left sided weakness  AD/vascular dementia: self propels slowly in wheelchair, participates in bingo, able to communicate but overall poor historian MMSE 12/26 December 2017  DM II: CBGs improved 140-190 in the am, 184 to 310 in the evening Continues to eat sweets at times Lab Results  Component  Value Date   HGBA1C 7.3 11/24/2017   CKD III Lab Results  Component Value Date   BUN 32 (A) 11/24/2017   Lab Results  Component Value Date   CREATININE 1.2 11/24/2017   Regular bowel movements with colace  Functional status: hoyer lift and incontinent  Past Medical History:  Diagnosis Date  . Anemia, iron deficiency 12/30/2012  . Basal cell carcinoma   . Bipolar 1 disorder ([place])   . CKD (chronic kidney disease), stage II 07/09/2012   07/16/12:   stage II chronic kidney disease by GFR.  Follow lab at interval    . Dementia ([place])   . Diabetes ([place])   . GI bleed   . Hypertension   . Mixed Alzheimer's and vascular dementia ([place]) 12/01/2012   06/07/12: STML re: CVA- contribute to debility/dependence. Pt. is aware of deficit. Will benefit from [place] [address] environment for assistance, supervision, socialization    07/16/12:  Admission MDS reviewed: BIMS score 13/15,  functional status: Pt requires limited to extensive assist with all ADLs except eating (supervision)    . Pressure ulcer of sacrum 12/07/2012  . Squamous cell carcinoma of skin of scalp 01/24/14   anterior and posterior scalp   . Stroke [facility]) 08/2009   Left side with residual deficit   Past Surgical History:  Procedure Laterality Date  . HIP ARTHROPLASTY Left 12/01/2012   Procedure: ARTHROPLASTY BIPOLAR HIP;  Surgeon: Johnn Hai, MD;  Location: [facility] ORS;  Service: Orthopedics;  Laterality:  Left;  . SKIN LESION EXCISION  01/24/14   Anterior and posterio scalp Squamous cell [name] PA    No Known Allergies  Outpatient Encounter Medications as of 06/04/2018  Medication Sig  . acetaminophen (TYLENOL) 325 MG tablet Take 650 mg by mouth every 6 (six) hours as needed for pain.  Marland Kitchen antiseptic oral rinse (BIOTENE) LIQD 15 mLs by Mouth Rinse route 4 (four) times daily.  Marland Kitchen aspirin 81 MG tablet Take 81 mg by mouth daily.  . cholecalciferol (VITAMIN D) 1000 UNITS tablet Take 1,000 Units by mouth every morning.  . clomiPRAMINE  (ANAFRANIL) 25 MG capsule Take 1 capsule (25 mg total) by mouth at bedtime.  . docusate sodium (COLACE) 100 MG capsule Take 100 mg by mouth daily.  Marland Kitchen donepezil (ARICEPT) 10 MG tablet Take 10 mg by mouth daily.  . Insulin Detemir (LEVEMIR FLEXTOUCH) 100 UNIT/ML Pen Inject 12 Units into the skin 2 (two) times daily. Hold for CBG <85  12 units in the am and 15 units in the pm  . ketoconazole (NIZORAL) 2 % shampoo Apply 1 application topically 2 (two) times a week.  . latanoprost (XALATAN) 0.005 % ophthalmic solution 1 drop at bedtime.  . metoprolol tartrate (LOPRESSOR) 25 MG tablet Take 75 mg by mouth 2 (two) times daily.   . Multiple Vitamins-Minerals (CENTRUM SILVER 50+MEN) TABS Take by mouth daily.  . QUEtiapine (SEROQUEL) 25 MG tablet Take 0.5 tablets (12.5 mg total) by mouth at bedtime.  . tamsulosin (FLOMAX) 0.4 MG CAPS Take 0.4 mg by mouth daily after breakfast.   No facility-administered encounter medications on file as of 06/04/2018.     Review of Systems  Constitutional: Negative for activity change, appetite change, chills, diaphoresis, fatigue, fever and unexpected weight change.  Respiratory: Negative for cough, shortness of breath, wheezing and stridor.   Cardiovascular: Positive for leg swelling. Negative for chest pain and palpitations.  Gastrointestinal: Negative for abdominal distention, abdominal pain, constipation and diarrhea.  Genitourinary: Negative for difficulty urinating and dysuria.  Musculoskeletal: Positive for gait problem. Negative for arthralgias, back pain, joint swelling and myalgias.  Neurological: Positive for weakness. Negative for dizziness, seizures, syncope, facial asymmetry, speech difficulty and headaches.  Hematological: Negative for adenopathy. Does not bruise/bleed easily.  Psychiatric/Behavioral: Positive for confusion. Negative for agitation and behavioral problems.    Immunization History  Administered Date(s) Administered  . Influenza Inj Mdck  Quad Pf 03/17/2016  . Influenza Whole 03/13/2013  . Influenza,inj,Quad PF,6+ Mos 03/20/2018  . Influenza-Unspecified 03/04/2014, 03/12/2015, 03/23/2017  . PPD Test 07/05/2012  . Pneumococcal Conjugate-13 07/01/2016  . Pneumococcal Polysaccharide-23 07/16/1996  . Td 07/01/2016   Pertinent  Health Maintenance Due  Topic Date Due  . HEMOGLOBIN A1C  05/26/2018  . FOOT EXAM  06/01/2018  . URINE MICROALBUMIN  06/02/2018  . OPHTHALMOLOGY EXAM  09/28/2018  . INFLUENZA VACCINE  Completed  . PNA vac Low Risk Adult  Completed   Fall Risk  04/28/2018 10/03/2017 09/28/2016 05/29/2015 03/16/2015  Falls in the past year? Exclusion - non ambulatory No No No No  Risk for fall due to : Impaired mobility - - History of fall(s) -  Follow up Education provided - - - -   Functional Status Survey:    Vitals:   06/06/18 1546  BP: (!) 133/95  Pulse: 78  Resp: 16  Temp: 98.2 F (36.8 C)  SpO2: 100%  Weight: 174 lb (78.9 kg)   Body mass index is 24.27 kg/m.  Wt Readings from Last  3 Encounters:  06/06/18 174 lb (78.9 kg)  05/10/18 174 lb 4.8 oz (79.1 kg)  04/17/18 175 lb (79.4 kg)    Physical Exam Vitals signs and nursing note reviewed.  Constitutional:      General: He is not in acute distress.    Appearance: He is not diaphoretic.  HENT:     Head:     Comments: Small hematoma to posterior scalp    Right Ear: Tympanic membrane, ear canal and external ear normal.     Left Ear: Ear canal and external ear normal.     Nose: Nose normal. No congestion or rhinorrhea.     Mouth/Throat:     Mouth: Mucous membranes are moist.     Pharynx: Oropharynx is clear. No oropharyngeal exudate.  Eyes:     General:        Right eye: No discharge.        Left eye: No discharge.     Conjunctiva/sclera: Conjunctivae normal.     Pupils: Pupils are equal, round, and reactive to light.  Neck:     Musculoskeletal: No muscular tenderness.     Thyroid: No thyromegaly.     Vascular: No JVD.     Trachea: No  tracheal deviation.  Cardiovascular:     Rate and Rhythm: Normal rate and regular rhythm.     Heart sounds: No murmur.  Pulmonary:     Effort: Pulmonary effort is normal. No respiratory distress.     Breath sounds: Normal breath sounds. No wheezing.  Abdominal:     General: Bowel sounds are normal. There is distension (mild lower abd with palpable bladder).     Palpations: Abdomen is soft.     Tenderness: There is no abdominal tenderness.  Musculoskeletal:        General: No tenderness or signs of injury.     Right lower leg: No edema.     Left lower leg: Edema present.     Comments: Left hand edema  Lymphadenopathy:     Cervical: No cervical adenopathy.  Skin:    General: Skin is warm and dry.  Neurological:     Mental Status: He is alert. Mental status is at baseline.     Cranial Nerves: No cranial nerve deficit.     Comments: Left sided weakness associated with CVA  Psychiatric:        Mood and Affect: Mood normal.     Labs reviewed: Recent Labs    11/24/17  NA 140  K 4.6  BUN 32*  CREATININE 1.2   No results for input(s): AST, ALT, ALKPHOS, BILITOT, PROT, ALBUMIN in the last 8760 hours. Recent Labs    11/24/17  WBC 7.7  HGB 11.7*  HCT 34*  PLT 189   Lab Results  Component Value Date   TSH 1.67 03/05/2018   Lab Results  Component Value Date   HGBA1C 7.3 11/24/2017   Lab Results  Component Value Date   CHOL 152 06/27/2013   HDL 55 06/27/2013   LDLCALC 78 06/27/2013   TRIG 83 06/27/2013    Significant Diagnostic Results in last 30 days:  No results found.  Assessment/Plan  1. Fall, initial encounter Mechanical in nature  2. Hematoma of scalp, initial encounter Mild with no neuro deficit outside his baseline left sided weakness Staff to continue neuro checks  3. Urinary retention If no urine out put check bladder scan and notify if > or equal to 250 cc. Continue Flomax   4.  Mixed Alzheimer's and vascular dementia ([place]) Progressive decline  in MMSE testing Continue Aricept   5. Essential hypertension Controlled Continue lopressor 75 mg bid  6. Type 2 diabetes mellitus with stage 3 chronic kidney disease, with long-term current use of insulin (HCC) Controlled with current regimen Diet non compliance  Check A1C  7. CKD (chronic kidney disease) stage 3, GFR 30-59 ml/min (HCC) Continue to periodically monitor BMP and avoid nephrotoxic agents  8. Slow transit constipation Continue Colace 100 mg qd  Family/ staff Communication: staff/resident  Labs/tests ordered: Will check BMP and A1C

## 2018-07-03 ENCOUNTER — Encounter: Payer: Self-pay | Admitting: Internal Medicine

## 2018-07-03 ENCOUNTER — Non-Acute Institutional Stay (SKILLED_NURSING_FACILITY): Payer: Medicare Other | Admitting: Internal Medicine

## 2018-07-03 DIAGNOSIS — G8194 Hemiplegia, unspecified affecting left nondominant side: Secondary | ICD-10-CM

## 2018-07-03 DIAGNOSIS — R3981 Functional urinary incontinence: Secondary | ICD-10-CM

## 2018-07-03 DIAGNOSIS — N183 Chronic kidney disease, stage 3 unspecified: Secondary | ICD-10-CM

## 2018-07-03 DIAGNOSIS — F0280 Dementia in other diseases classified elsewhere without behavioral disturbance: Secondary | ICD-10-CM

## 2018-07-03 DIAGNOSIS — F3176 Bipolar disorder, in full remission, most recent episode depressed: Secondary | ICD-10-CM | POA: Diagnosis not present

## 2018-07-03 DIAGNOSIS — G309 Alzheimer's disease, unspecified: Secondary | ICD-10-CM | POA: Diagnosis not present

## 2018-07-03 DIAGNOSIS — E1122 Type 2 diabetes mellitus with diabetic chronic kidney disease: Secondary | ICD-10-CM

## 2018-07-03 DIAGNOSIS — F0150 Vascular dementia without behavioral disturbance: Secondary | ICD-10-CM

## 2018-07-03 DIAGNOSIS — Z794 Long term (current) use of insulin: Secondary | ICD-10-CM

## 2018-07-03 NOTE — Progress Notes (Signed)
Patient ID: George Kemp, male   DOB: 02-Oct-1926, 85 y.o.   MRN: [id number]  Location:  [place] Room Number: 902 Place of Service:  SNF ([phone]) Provider:   Gayland Curry, DO  Patient Care Team: Gayland Curry, DO as PCP - General (Geriatric Medicine) Community, [name]  Extended Emergency Contact Information Primary Emergency Contact: [facility] Address: [address]          [place], [place] [address] [name] of [place] Phone: [phone] Mobile Phone: [phone] Relation: Daughter  Code Status:  DNR Goals of care: Advanced Directive information Advanced Directives 07/03/2018  Does Patient Have a Medical Advance Directive? Yes  Type of Paramedic of [place];Out of facility DNR (pink MOST or yellow form)  Does patient want to make changes to medical advance directive? No - Patient declined  Copy of [place] in Chart? Yes - validated most recent copy scanned in chart (See row information)  Pre-existing out of facility DNR order (yellow form or pink MOST form) Yellow form placed in chart (order not valid for inpatient use)     Chief Complaint  Patient presents with  . Medical Management of Chronic Issues    Routine Visit    HPI:  Pt is a 85 y.o. male seen today for medical management of chronic diseases.  [place]on had some difficulty with low urine output which it appears was only a short-lived problem.  He required bladder scanning one day.   He continues to do well in SNF care ever since his stroke.  No reported behavioral concerns.  He has left hemiparesis.  He is diabetic, but glucose control recently improved, as well.   Lab Results  Component Value Date   HGBA1C 7.8 06/05/2018  Weight is stable.  No report tearful episodes and is smiling and friendly during visit with NP student and myself.  Past Medical History:  Diagnosis Date  . Anemia, iron deficiency  12/30/2012  . Basal cell carcinoma   . Bipolar 1 disorder ([place])   . CKD (chronic kidney disease), stage II 07/09/2012   07/16/12:   stage II chronic kidney disease by GFR.  Follow lab at interval    . Dementia ([place])   . Diabetes ([place])   . GI bleed   . Hypertension   . Mixed Alzheimer's and vascular dementia ([place]) 12/01/2012   06/07/12: STML re: CVA- contribute to debility/dependence. Pt. is aware of deficit. Will benefit from [place] environment for assistance, supervision, socialization    07/16/12:  Admission MDS reviewed: BIMS score 13/15,  functional status: Pt requires limited to extensive assist with all ADLs except eating (supervision)    . Pressure ulcer of sacrum 12/07/2012  . Squamous cell carcinoma of skin of scalp 01/24/14   anterior and posterior scalp   . Stroke [facility]) 08/2009   Left side with residual deficit   Past Surgical History:  Procedure Laterality Date  . HIP ARTHROPLASTY Left 12/01/2012   Procedure: ARTHROPLASTY BIPOLAR HIP;  Surgeon: Johnn Hai, MD;  Location: [facility]S;  Service: Orthopedics;  Laterality: Left;  . SKIN LESION EXCISION  01/24/14   Anterior and posterio scalp Squamous cell [name] PA    No Known Allergies  Outpatient Encounter Medications as of 07/03/2018  Medication Sig  . acetaminophen (TYLENOL) 325 MG tablet Take 650 mg by mouth every 6 (six) hours as needed for pain.  Marland Kitchen antiseptic oral rinse (BIOTENE) LIQD 15  mLs by Mouth Rinse route 4 (four) times daily.  Marland Kitchen aspirin 81 MG tablet Take 81 mg by mouth daily.  . cholecalciferol (VITAMIN D) 1000 UNITS tablet Take 1,000 Units by mouth every morning.  . clomiPRAMINE (ANAFRANIL) 25 MG capsule Take 1 capsule (25 mg total) by mouth at bedtime.  . docusate sodium (COLACE) 100 MG capsule Take 100 mg by mouth daily.  Marland Kitchen donepezil (ARICEPT) 10 MG tablet Take 10 mg by mouth daily.  . Insulin Detemir (LEVEMIR FLEXTOUCH) 100 UNIT/ML Pen Inject 12 Units into the skin 2 (two) times daily. Hold for CBG <85    12 units in the am and 15 units in the pm  . ketoconazole (NIZORAL) 2 % shampoo Apply 1 application topically 2 (two) times a week.  . latanoprost (XALATAN) 0.005 % ophthalmic solution 1 drop at bedtime.  . metoprolol tartrate (LOPRESSOR) 25 MG tablet Take 75 mg by mouth 2 (two) times daily.   . Multiple Vitamins-Minerals (CENTRUM SILVER 50+MEN) TABS Take by mouth daily.  . QUEtiapine (SEROQUEL) 25 MG tablet Take 0.5 tablets (12.5 mg total) by mouth at bedtime.  . tamsulosin (FLOMAX) 0.4 MG CAPS Take 0.4 mg by mouth daily after breakfast.   No facility-administered encounter medications on file as of 07/03/2018.     Review of Systems  Constitutional: Positive for fatigue. Negative for activity change, appetite change, chills, fever and unexpected weight change.  HENT: Negative for congestion.   Eyes: Negative for visual disturbance.       Glasses  Respiratory: Negative for cough, chest tightness and shortness of breath.   Cardiovascular: Negative for chest pain, palpitations and leg swelling.  Gastrointestinal: Negative for abdominal pain and constipation.  Genitourinary: Negative for dysuria.  Musculoskeletal: Positive for gait problem.       Left hemiparesis, uses manual wheelchair  Neurological: Positive for weakness and numbness. Negative for dizziness.       Left hemiparesis  Psychiatric/Behavioral: Positive for confusion. Negative for agitation, behavioral problems and sleep disturbance. The patient is not nervous/anxious.     Immunization History  Administered Date(s) Administered  . Influenza Inj Mdck Quad Pf 03/17/2016  . Influenza Whole 03/13/2013  . Influenza,inj,Quad PF,6+ Mos 03/20/2018  . Influenza-Unspecified 03/04/2014, 03/12/2015, 03/23/2017  . PPD Test 07/05/2012  . Pneumococcal Conjugate-13 07/01/2016  . Pneumococcal Polysaccharide-23 07/16/1996  . Td 07/01/2016   Pertinent  Health Maintenance Due  Topic Date Due  . FOOT EXAM  06/01/2018  . URINE  MICROALBUMIN  06/02/2018  . OPHTHALMOLOGY EXAM  09/28/2018  . HEMOGLOBIN A1C  12/04/2018  . INFLUENZA VACCINE  Completed  . PNA vac Low Risk Adult  Completed   Fall Risk  07/12/2018 04/28/2018 10/03/2017 09/28/2016 05/29/2015  Falls in the past year? 1 Exclusion - non ambulatory No No No  Number falls in past yr: 0 - - - -  Injury with Fall? 1 - - - -  Risk for fall due to : History of fall(s);Impaired mobility;Impaired balance/gait;Mental status change Impaired mobility - - History of fall(s)  Follow up Falls evaluation completed;Education provided;Falls prevention discussed Education provided - - -  Comment involved bathing apparatus and staff were reeducated  - - - -   Functional Status Survey:    Vitals:   07/03/18 1446  BP: 126/77  Pulse: 89  Resp: 16  Temp: (!) 97.4 F (36.3 C)  TempSrc: Oral  SpO2: 98%  Weight: 174 lb (78.9 kg)  Height: 5\' 11"  (1.803 m)   Body mass index is  24.27 kg/m. Physical Exam Vitals signs and nursing note reviewed.  Constitutional:      General: He is not in acute distress.    Appearance: He is normal weight. He is not toxic-appearing.  HENT:     Head: Normocephalic and atraumatic.  Cardiovascular:     Rate and Rhythm: Normal rate and regular rhythm.     Pulses: Normal pulses.     Heart sounds: Normal heart sounds.  Pulmonary:     Effort: Pulmonary effort is normal.     Breath sounds: Normal breath sounds. No rales.  Abdominal:     General: Bowel sounds are normal. There is no distension.     Palpations: Abdomen is soft.     Tenderness: There is no abdominal tenderness.  Musculoskeletal:     Comments: Left hemiparesis:  3/5 LUE, 2/5 LLE  Skin:    General: Skin is warm and dry.     Capillary Refill: Capillary refill takes less than 2 seconds.     Coloration: Skin is pale.  Neurological:     Mental Status: He is alert.     Cranial Nerves: Cranial nerve deficit present.     Sensory: Sensory deficit present.     Motor: Weakness present.      Gait: Gait abnormal.     Deep Tendon Reflexes: Reflexes abnormal.     Comments: leftt hemiparesis and some speech slurring (not changed)  Psychiatric:        Mood and Affect: Mood normal.     Labs reviewed: Recent Labs    11/24/17 06/05/18 0600  NA 140 142  K 4.6 4.9  BUN 32* 23*  CREATININE 1.2 1.2   No results for input(s): AST, ALT, ALKPHOS, BILITOT, PROT, ALBUMIN in the last 8760 hours. Recent Labs    11/24/17  WBC 7.7  HGB 11.7*  HCT 34*  PLT 189   Lab Results  Component Value Date   TSH 1.67 03/05/2018   Lab Results  Component Value Date   HGBA1C 7.8 06/05/2018   Lab Results  Component Value Date   CHOL 152 06/27/2013   HDL 55 06/27/2013   LDLCALC 78 06/27/2013   TRIG 83 06/27/2013    Assessment/Plan 1. Mixed Alzheimer's and vascular dementia ([place]) -gradual progression noted, seems more confused during visits and harder to understand what he speaks about -cont snf support with comfort goals  2. Bipolar 1 disorder, depressed, full remission (HCC) -cont chronic anafranil and seroquel which have worked well  3. Type 2 diabetes mellitus with stage 3 chronic kidney disease, with long-term current use of insulin (HCC) -well controlled considering his age, snf status, and other chronic illness/overall frailty -no changes needed to regimen  4. CKD (chronic kidney disease) stage 3, GFR 30-59 ml/min (HCC) -Avoid nephrotoxic agents like nsaids, dose adjust renally excreted meds, hydrate.  5. Urinary incontinence due to immobility -had some issues with possible retention, but was short-lived, unclear why occurred, monitor and bladder scan if needed  6. Left hemiparesis ([place]) -stable, cont supportive brace for foot drop, manual wheelchair  Family/ staff Communication: discussed with snf nurse  Labs/tests ordered:  No new  Eren Puebla L. Ulyses Panico, D.O. [facility] [address], [place] [address] Cell Phone  (Mon-Fri 8am-5pm):  [phone] On Call:  [phone] & follow prompts after 5pm & weekends Office Phone:  [phone] Office Fax:  [phone]

## 2018-07-12 DIAGNOSIS — G8194 Hemiplegia, unspecified affecting left nondominant side: Secondary | ICD-10-CM | POA: Insufficient documentation

## 2018-08-02 ENCOUNTER — Non-Acute Institutional Stay (SKILLED_NURSING_FACILITY): Payer: Medicare Other | Admitting: Adult Health

## 2018-08-02 ENCOUNTER — Encounter: Payer: Self-pay | Admitting: Adult Health

## 2018-08-02 DIAGNOSIS — G8194 Hemiplegia, unspecified affecting left nondominant side: Secondary | ICD-10-CM

## 2018-08-02 DIAGNOSIS — G309 Alzheimer's disease, unspecified: Secondary | ICD-10-CM | POA: Diagnosis not present

## 2018-08-02 DIAGNOSIS — N183 Chronic kidney disease, stage 3 unspecified: Secondary | ICD-10-CM

## 2018-08-02 DIAGNOSIS — N400 Enlarged prostate without lower urinary tract symptoms: Secondary | ICD-10-CM | POA: Diagnosis not present

## 2018-08-02 DIAGNOSIS — Z794 Long term (current) use of insulin: Secondary | ICD-10-CM

## 2018-08-02 DIAGNOSIS — E1122 Type 2 diabetes mellitus with diabetic chronic kidney disease: Secondary | ICD-10-CM

## 2018-08-02 DIAGNOSIS — F0280 Dementia in other diseases classified elsewhere without behavioral disturbance: Secondary | ICD-10-CM

## 2018-08-02 DIAGNOSIS — F0150 Vascular dementia without behavioral disturbance: Secondary | ICD-10-CM

## 2018-08-02 DIAGNOSIS — F3176 Bipolar disorder, in full remission, most recent episode depressed: Secondary | ICD-10-CM

## 2018-08-02 NOTE — Progress Notes (Signed)
Location:  Occupational psychologist of Service:  SNF (31) Provider:   Cindi Carbon, ANP [place] [phone]   Gayland Curry, DO  Patient Care Team: Gayland Curry, DO as PCP - General (Geriatric Medicine) Fay Records, Well [name]  Extended Emergency Contact Information Primary Emergency Contact: [facility] Address: [address]          [place], [place] [address] [name] of [place] Phone: [phone] Mobile Phone: [phone] Relation: Daughter  Code Status:  DNR Goals of care: Advanced Directive information Advanced Directives 07/03/2018  Does Patient Have a Medical Advance Directive? Yes  Type of Paramedic of [place];Out of facility DNR (pink MOST or yellow form)  Does patient want to make changes to medical advance directive? No - Patient declined  Copy of [place] in Chart? Yes - validated most recent copy scanned in chart (See row information)  Pre-existing out of facility DNR order (yellow form or pink MOST form) Yellow form placed in chart (order not valid for inpatient use)     Chief Complaint  Patient presents with  . Medical Management of Chronic Issues    HPI:  Pt is a 85 y.o. male seen today for medical management of chronic diseases.    DM II: CBG range 133-216 in the morning and 173-265 in the evening  Lab Results  Component Value Date   HGBA1C 7.8 06/05/2018   CVA with left sided hemiparesis  Mixed Vascular/Alz dementia: progressive more confused per staff and needs re orienting and cuing  06/17/18 MMSE 16/30  CKD  Lab Results  Component Value Date   BUN 23 (A) 06/05/2018   Lab Results  Component Value Date   CREATININE 1.2 06/05/2018   Bipolar: doing well and denies any symptoms of depression or mania. No reported issues from staff  BPH: no issues with voiding this month, did require bladder scan last month after issues with retention  which resolved.    Functional status: incontinent and uses a hoyer lift for transfers  Past Medical History:  Diagnosis Date  . Anemia, iron deficiency 12/30/2012  . Basal cell carcinoma   . Bipolar 1 disorder ([place])   . CKD (chronic kidney disease), stage II 07/09/2012   07/16/12:   stage II chronic kidney disease by GFR.  Follow lab at interval    . Dementia ([place])   . Diabetes ([place])   . GI bleed   . Hypertension   . Mixed Alzheimer's and vascular dementia ([place]) 12/01/2012   06/07/12: STML re: CVA- contribute to debility/dependence. Pt. is aware of deficit. Will benefit from [place] environment for assistance, supervision, socialization    07/16/12:  Admission MDS reviewed: BIMS score 13/15,  functional status: Pt requires limited to extensive assist with all ADLs except eating (supervision)    . Pressure ulcer of sacrum 12/07/2012  . Squamous cell carcinoma of skin of scalp 01/24/14   anterior and posterior scalp   . Stroke [facility]) 08/2009   Left side with residual deficit   Past Surgical History:  Procedure Laterality Date  . HIP ARTHROPLASTY Left 12/01/2012   Procedure: ARTHROPLASTY BIPOLAR HIP;  Surgeon: Johnn Hai, MD;  Location: [facility] ORS;  Service: Orthopedics;  Laterality: Left;  . SKIN LESION EXCISION  01/24/14   Anterior and posterio scalp Squamous cell [name] PA    No Known Allergies  Outpatient Encounter Medications as of 08/02/2018  Medication Sig  . donepezil (  ARICEPT) 10 MG tablet Take 10 mg by mouth daily.  . Insulin Detemir (LEVEMIR FLEXTOUCH) 100 UNIT/ML Pen Inject 12 Units into the skin 2 (two) times daily. Hold for CBG <85  12 units in the am and 15 units in the pm  . ketoconazole (NIZORAL) 2 % shampoo Apply 1 application topically 2 (two) times a week.  . latanoprost (XALATAN) 0.005 % ophthalmic solution 1 drop at bedtime.  . metoprolol tartrate (LOPRESSOR) 25 MG tablet Take 75 mg by mouth 2 (two) times daily.   . Multiple Vitamins-Minerals (CENTRUM SILVER  50+MEN) TABS Take by mouth daily.  . QUEtiapine (SEROQUEL) 25 MG tablet Take 0.5 tablets (12.5 mg total) by mouth at bedtime.  . tamsulosin (FLOMAX) 0.4 MG CAPS Take 0.4 mg by mouth daily after breakfast.  . acetaminophen (TYLENOL) 325 MG tablet Take 650 mg by mouth every 6 (six) hours as needed for pain.  Marland Kitchen antiseptic oral rinse (BIOTENE) LIQD 15 mLs by Mouth Rinse route 4 (four) times daily.  Marland Kitchen aspirin 81 MG tablet Take 81 mg by mouth daily.  . cholecalciferol (VITAMIN D) 1000 UNITS tablet Take 1,000 Units by mouth every morning.  . clomiPRAMINE (ANAFRANIL) 25 MG capsule Take 1 capsule (25 mg total) by mouth at bedtime.  . docusate sodium (COLACE) 100 MG capsule Take 100 mg by mouth daily.   No facility-administered encounter medications on file as of 08/02/2018.     Review of Systems  Immunization History  Administered Date(s) Administered  . Influenza Inj Mdck Quad Pf 03/17/2016  . Influenza Whole 03/13/2013  . Influenza,inj,Quad PF,6+ Mos 03/20/2018  . Influenza-Unspecified 03/04/2014, 03/12/2015, 03/23/2017  . PPD Test 07/05/2012  . Pneumococcal Conjugate-13 07/01/2016  . Pneumococcal Polysaccharide-23 07/16/1996  . Td 07/01/2016   Pertinent  Health Maintenance Due  Topic Date Due  . FOOT EXAM  06/01/2018  . URINE MICROALBUMIN  09/02/2018 (Originally 06/02/2018)  . OPHTHALMOLOGY EXAM  09/28/2018  . HEMOGLOBIN A1C  12/04/2018  . INFLUENZA VACCINE  Completed  . PNA vac Low Risk Adult  Completed   Fall Risk  07/12/2018 04/28/2018 10/03/2017 09/28/2016 05/29/2015  Falls in the past year? 1 Exclusion - non ambulatory No No No  Number falls in past yr: 0 - - - -  Injury with Fall? 1 - - - -  Risk for fall due to : History of fall(s);Impaired mobility;Impaired balance/gait;Mental status change Impaired mobility - - History of fall(s)  Follow up Falls evaluation completed;Education provided;Falls prevention discussed Education provided - - -  Comment involved bathing apparatus and staff  were reeducated  - - - -   Functional Status Survey:    Vitals:   08/06/18 1435  Weight: 178 lb 11.2 oz (81.1 kg)   Body mass index is 24.92 kg/m. Physical Exam  Labs reviewed: Recent Labs    11/24/17 06/05/18 0600  NA 140 142  K 4.6 4.9  BUN 32* 23*  CREATININE 1.2 1.2   No results for input(s): AST, ALT, ALKPHOS, BILITOT, PROT, ALBUMIN in the last 8760 hours. Recent Labs    11/24/17  WBC 7.7  HGB 11.7*  HCT 34*  PLT 189   Lab Results  Component Value Date   TSH 1.67 03/05/2018   Lab Results  Component Value Date   HGBA1C 7.8 06/05/2018   Lab Results  Component Value Date   CHOL 152 06/27/2013   HDL 55 06/27/2013   LDLCALC 78 06/27/2013   TRIG 83 06/27/2013    Significant Diagnostic Results in  last 30 days:  No results found.  Assessment/Plan 1. Mixed Alzheimer's and vascular dementia ([place]) Continue Aricept and supportive setting in skilled care.   2. Type 2 diabetes mellitus with stage 3 chronic kidney disease, with long-term current use of insulin (HCC) Controlled  Continue Levemir 12 units bid  3. CKD (chronic kidney disease) stage 3, GFR 30-59 ml/min (HCC) Continue to periodically monitor BMP and avoid nephrotoxic agents  4. Benign prostatic hyperplasia without lower urinary tract symptoms Continue Flomax 0.4 mg qd  5. Bipolar 1 disorder, depressed, full remission ([place]) Continue anafranil and seroquel for mood   6. Left hemiparesis (HCC) Due to previous CVA.  Hoyer lift for transfers with supportive chair/tray for arm support    Family/ staff Communication: staff/resident  Labs/tests ordered:  NA

## 2018-08-30 ENCOUNTER — Encounter: Payer: Self-pay | Admitting: Adult Health

## 2018-08-30 ENCOUNTER — Non-Acute Institutional Stay (SKILLED_NURSING_FACILITY): Payer: Medicare Other | Admitting: Adult Health

## 2018-08-30 DIAGNOSIS — E1122 Type 2 diabetes mellitus with diabetic chronic kidney disease: Secondary | ICD-10-CM

## 2018-08-30 DIAGNOSIS — G309 Alzheimer's disease, unspecified: Secondary | ICD-10-CM

## 2018-08-30 DIAGNOSIS — N183 Chronic kidney disease, stage 3 unspecified: Secondary | ICD-10-CM

## 2018-08-30 DIAGNOSIS — D638 Anemia in other chronic diseases classified elsewhere: Secondary | ICD-10-CM | POA: Diagnosis not present

## 2018-08-30 DIAGNOSIS — F0280 Dementia in other diseases classified elsewhere without behavioral disturbance: Secondary | ICD-10-CM

## 2018-08-30 DIAGNOSIS — L210 Seborrhea capitis: Secondary | ICD-10-CM

## 2018-08-30 DIAGNOSIS — F0150 Vascular dementia without behavioral disturbance: Secondary | ICD-10-CM

## 2018-08-30 DIAGNOSIS — Z794 Long term (current) use of insulin: Secondary | ICD-10-CM

## 2018-09-04 NOTE — Progress Notes (Signed)
Location:  Occupational psychologist of Service:  SNF (31) Provider:   Cindi Carbon, ANP [place] [phone]   Gayland Curry, DO  Patient Care Team: Gayland Curry, DO as PCP - General (Geriatric Medicine) Fay Records, Well [name]  Extended Emergency Contact Information Primary Emergency Contact: [facility] Address: [address]          [place], [place] [address] [name] of [place] Phone: [phone] Mobile Phone: [phone] Relation: Daughter  Code Status:  DNR Goals of care: Advanced Directive information Advanced Directives 07/03/2018  Does Patient Have a Medical Advance Directive? Yes  Type of Paramedic of [place];Out of facility DNR (pink MOST or yellow form)  Does patient want to make changes to medical advance directive? No - Patient declined  Copy of [place] in Chart? Yes - validated most recent copy scanned in chart (See row information)  Pre-existing out of facility DNR order (yellow form or pink MOST form) Yellow form placed in chart (order not valid for inpatient use)     Chief Complaint  Patient presents with   Medical Management of Chronic Issues    HPI:  Pt is a 85 y.o. male seen today for medical management of chronic diseases.  He has a hx of CVA with left sided hemiparesis.  DM II: CBG range 149-196 in the morning and 150-358 in the evening  Lab Results  Component Value Date   HGBA1C 7.8 06/05/2018   Mixed Vascular/Alz dementia: No reports of behavioral issues. Continues to participate in activities. Needs more cuing, re orientation. 06/17/18 MMSE 16/30  No reports of issues with appetite, bowels, bladder.   Functional status: incontinent and uses a hoyer lift for transfers  Past Medical History:  Diagnosis Date   Anemia, iron deficiency 12/30/2012   Basal cell carcinoma    Bipolar 1 disorder (HCC)    CKD (chronic kidney disease),  stage II 07/09/2012   07/16/12:   stage II chronic kidney disease by GFR.  Follow lab at interval     Dementia ([place])    Diabetes ([place])    GI bleed    Hypertension    Mixed Alzheimer's and vascular dementia ([place]) 12/01/2012   06/07/12: STML re: CVA- contribute to debility/dependence. Pt. is aware of deficit. Will benefit from [place] environment for assistance, supervision, socialization    07/16/12:  Admission MDS reviewed: BIMS score 13/15,  functional status: Pt requires limited to extensive assist with all ADLs except eating (supervision)     Pressure ulcer of sacrum 12/07/2012   Squamous cell carcinoma of skin of scalp 01/24/14   anterior and posterior scalp    Stroke ([place]) 08/2009   Left side with residual deficit   Past Surgical History:  Procedure Laterality Date   HIP ARTHROPLASTY Left 12/01/2012   Procedure: ARTHROPLASTY BIPOLAR HIP;  Surgeon: Johnn Hai, MD;  Location[facility];  Service: Orthopedics;  Laterality: Left;   SKIN LESION EXCISION  01/24/14   Anterior and posterio scalp Squamous cell [name] PA    No Known Allergies  Outpatient Encounter Medications as of 08/30/2018  Medication Sig   acetaminophen (TYLENOL) 325 MG tablet Take 650 mg by mouth every 6 (six) hours as needed for pain.   antiseptic oral rinse (BIOTENE) LIQD 15 mLs by Mouth Rinse route 4 (four) times daily.   aspirin 81 MG tablet Take 81 mg by mouth daily.   cholecalciferol (VITAMIN D) 1000  UNITS tablet Take 1,000 Units by mouth every morning.   clomiPRAMINE (ANAFRANIL) 25 MG capsule Take 1 capsule (25 mg total) by mouth at bedtime.   docusate sodium (COLACE) 100 MG capsule Take 100 mg by mouth daily.   donepezil (ARICEPT) 10 MG tablet Take 10 mg by mouth daily.   Insulin Detemir (LEVEMIR FLEXTOUCH) 100 UNIT/ML Pen Inject 12 Units into the skin 2 (two) times daily. Hold for CBG <85  12 units in the am and 15 units in the pm   ketoconazole (NIZORAL) 2 % shampoo Apply 1 application  topically 2 (two) times a week.   latanoprost (XALATAN) 0.005 % ophthalmic solution 1 drop at bedtime.   metoprolol tartrate (LOPRESSOR) 25 MG tablet Take 75 mg by mouth 2 (two) times daily.    Multiple Vitamins-Minerals (CENTRUM SILVER 50+MEN) TABS Take by mouth daily.   QUEtiapine (SEROQUEL) 25 MG tablet Take 0.5 tablets (12.5 mg total) by mouth at bedtime.   tamsulosin (FLOMAX) 0.4 MG CAPS Take 0.4 mg by mouth daily after breakfast.   No facility-administered encounter medications on file as of 08/30/2018.     Review of Systems  Immunization History  Administered Date(s) Administered   Influenza Inj Mdck Quad Pf 03/17/2016   Influenza Whole 03/13/2013   Influenza,inj,Quad PF,6+ Mos 03/20/2018   Influenza-Unspecified 03/04/2014, 03/12/2015, 03/23/2017   PPD Test 07/05/2012   Pneumococcal Conjugate-13 07/01/2016   Pneumococcal Polysaccharide-23 07/16/1996   Td 07/01/2016   Pertinent  Health Maintenance Due  Topic Date Due   FOOT EXAM  06/01/2018   URINE MICROALBUMIN  06/02/2018   OPHTHALMOLOGY EXAM  09/28/2018   HEMOGLOBIN A1C  12/04/2018   INFLUENZA VACCINE  12/29/2018   PNA vac Low Risk Adult  Completed   Fall Risk  07/12/2018 04/28/2018 10/03/2017 09/28/2016 05/29/2015  Falls in the past year? 1 Exclusion - non ambulatory No No No  Number falls in past yr: 0 - - - -  Injury with Fall? 1 - - - -  Risk for fall due to : History of fall(s);Impaired mobility;Impaired balance/gait;Mental status change Impaired mobility - - History of fall(s)  Follow up Falls evaluation completed;Education provided;Falls prevention discussed Education provided - - -  Comment involved bathing apparatus and staff were reeducated  - - - -   Functional Status Survey:    Vitals:   09/04/18 0738  BP: 126/75  Pulse: 75  Resp: 16  Temp: 97.7 F (36.5 C)  SpO2: 97%  Weight: 179 lb 11.2 oz (81.5 kg)   Body mass index is 25.06 kg/m. Physical Exam Constitutional:       Appearance: He is normal weight.  HENT:     Head: Atraumatic.  Pulmonary:     Effort: Pulmonary effort is normal. No respiratory distress.  Abdominal:     General: There is no distension.  Neurological:     Mental Status: He is alert. Mental status is at baseline.     Comments: Left sided hemiparesis. Oriented to self and place.   Psychiatric:        Mood and Affect: Mood normal.     Labs reviewed: Recent Labs    11/24/17 06/05/18 0600  NA 140 142  K 4.6 4.9  BUN 32* 23*  CREATININE 1.2 1.2   No results for input(s): AST, ALT, ALKPHOS, BILITOT, PROT, ALBUMIN in the last 8760 hours. Recent Labs    11/24/17  WBC 7.7  HGB 11.7*  HCT 34*  PLT 189   Lab Results  Component  Value Date   TSH 1.67 03/05/2018   Lab Results  Component Value Date   HGBA1C 7.8 06/05/2018   Lab Results  Component Value Date   CHOL 152 06/27/2013   HDL 55 06/27/2013   LDLCALC 78 06/27/2013   TRIG 83 06/27/2013    Significant Diagnostic Results in last 30 days:  No results found.  Assessment/Plan 1. Type 2 diabetes mellitus with stage 3 chronic kidney disease, with long-term current use of insulin (HCC) Increase Levemir to 15 units bid Monitor A1C q 6 months.   2. CKD (chronic kidney disease) stage 3, GFR 30-59 ml/min (HCC) Lab Results  Component Value Date   BUN 23 (A) 06/05/2018   Lab Results  Component Value Date   CREATININE 1.2 06/05/2018    Continue to periodically monitor BMP and avoid nephrotoxic agents  3. Anemia of chronic disease Lab Results  Component Value Date   HGB 11.7 (A) 11/24/2017  Continue to monitor.   4. Mixed Alzheimer's and vascular dementia ([place]) Progressive decline in cognition but remains able to participate in activities so we will continue the Aricept.  5. Seborrhea capitis Continue ketoconazole shampoo twice weekly    Family/ staff Communication: staff/resident  Labs/tests ordered:  NA

## 2018-09-29 LAB — BASIC METABOLIC PANEL
BUN: 31 — AB (ref 4–21)
Creatinine: 1.4 — AB (ref 0.6–1.3)
Glucose: 276
Potassium: 4.3 (ref 3.4–5.3)
Sodium: 135 — AB (ref 137–147)

## 2018-09-29 LAB — CBC AND DIFFERENTIAL
HCT: 32 — AB (ref 41–53)
Hemoglobin: 10.8 — AB (ref 13.5–17.5)
Neutrophils Absolute: 10
Platelets: 225 (ref 150–399)
WBC: 13.6

## 2018-10-01 ENCOUNTER — Encounter: Payer: Self-pay | Admitting: Adult Health

## 2018-10-01 ENCOUNTER — Other Ambulatory Visit: Payer: Self-pay

## 2018-10-01 ENCOUNTER — Non-Acute Institutional Stay (SKILLED_NURSING_FACILITY): Payer: Medicare Other | Admitting: Adult Health

## 2018-10-01 DIAGNOSIS — E860 Dehydration: Secondary | ICD-10-CM | POA: Diagnosis not present

## 2018-10-01 DIAGNOSIS — R1310 Dysphagia, unspecified: Secondary | ICD-10-CM | POA: Diagnosis not present

## 2018-10-01 DIAGNOSIS — Z7189 Other specified counseling: Secondary | ICD-10-CM | POA: Diagnosis not present

## 2018-10-01 DIAGNOSIS — R0902 Hypoxemia: Secondary | ICD-10-CM

## 2018-10-01 LAB — CBC AND DIFFERENTIAL
HCT: 32 — AB (ref 41–53)
Hemoglobin: 11.4 — AB (ref 13.5–17.5)
Platelets: 225 (ref 150–399)
WBC: 10.7

## 2018-10-01 NOTE — ACP (Advance Care Planning) (Signed)
Mr. George Kemp has a hx of CVA with left sided weakness. He has been coughing with thin liquids. He had an episode of sob, hypoxia, and low grade temp on 5/2.  He is improved but still requiring oxygen.  I discussed his goals of care with his daughter, George Kemp. She would like to avoid hospitalizations. She expressed that if he had an infection she would want it treated. We discussed his QOL and how this may be affected by diet changes due to dysphagia. We discussed a possible ST eval. She will discuss this with her husband and get [facility].

## 2018-10-01 NOTE — Progress Notes (Addendum)
Location:  Occupational psychologist of Service:  SNF (31) Provider:   Cindi Carbon, ANP [place] [phone]   Gayland Curry, DO  Patient Care Team: Gayland Curry, DO as PCP - General (Geriatric Medicine) [facility] Records, [name]  Extended Emergency Contact Information Primary Emergency Contact: [facility] Address: [address]          [place], [place] [address] [name] of [place] Phone: [phone] Mobile Phone: [phone] Relation: Daughter  Code Status:  DNR Goals of care: Advanced Directive information Advanced Directives 07/03/2018  Does Patient Have a Medical Advance Directive? Yes  Type of Paramedic of [place];Out of facility DNR (pink MOST or yellow form)  Does patient want to make changes to medical advance directive? No - Patient declined  Copy of [place] in Chart? Yes - validated most recent copy scanned in chart (See row information)  Pre-existing out of facility DNR order (yellow form or pink MOST form) Yellow form placed in chart (order not valid for inpatient use)     Chief Complaint  Patient presents with   Acute Visit    hypoxia    HPI:  Pt is a 85 y.o. male seen today for an acute visit for hypoxia. On 09/29/18 he was noted to be short of breath. Sats at that time were 85-88% on room air with a temp of 99.5 Oxygen applied at 2 liters and now sats are 92%.  No further shortness of breath. He has a hx of dementia and is a poor historian. He did report "not feeling well" but could not elaborate further. He was placed on isolation and covid screening performed. First swab returned negative. He had a decreased appetite on 5/3 but ate better and felt slightly better on 5/4.  Vitals have been within normal limits as of this morning 5/4 and no sob, sore throat, fever, low 02 sats chills, etc.  Labs on 5/2 showed a WBC of 13.6 and this was rechecked on 5/4  with a decrease in the WBC to 10.7.  BMP showed a BUN /Cr 31/1.4 which is a slight bump from baseline. CXR on 5/3 showed no acute findings but elevated right diaphragm noted and some thoracic scoliosis. He is a former smoker. He also has a hx of CVA with right sided weakness. He occasionally has a cough with thin liquids and this has been noted for the several days.   Past Medical History:  Diagnosis Date   Anemia, iron deficiency 12/30/2012   Basal cell carcinoma    Bipolar 1 disorder (HCC)    CKD (chronic kidney disease), stage II 07/09/2012   07/16/12:   stage II chronic kidney disease by GFR.  Follow lab at interval     Dementia ([place])    Diabetes ([place])    GI bleed    Hypertension    Mixed Alzheimer's and vascular dementia ([place]) 12/01/2012   06/07/12: STML re: CVA- contribute to debility/dependence. Pt. is aware of deficit. Will benefit from [place] environment for assistance, supervision, socialization    07/16/12:  Admission MDS reviewed: BIMS score 13/15,  functional status: Pt requires limited to extensive assist with all ADLs except eating (supervision)     Pressure ulcer of sacrum 12/07/2012   Squamous cell carcinoma of skin of scalp 01/24/14   anterior and posterior scalp    Stroke ([place]) 08/2009   Left side with residual deficit   Past Surgical History:  Procedure Laterality Date   HIP ARTHROPLASTY Left 12/01/2012   Procedure: ARTHROPLASTY BIPOLAR HIP;  Surgeon: Johnn Hai, MD;  Location: [facility];  Service: Orthopedics;  Laterality: Left;   SKIN LESION EXCISION  01/24/14   Anterior and posterio scalp Squamous cell [name] PA    No Known Allergies  Outpatient Encounter Medications as of 10/01/2018  Medication Sig   acetaminophen (TYLENOL) 325 MG tablet Take 650 mg by mouth every 6 (six) hours as needed for pain.   antiseptic oral rinse (BIOTENE) LIQD 15 mLs by Mouth Rinse route 4 (four) times daily.   aspirin 81 MG tablet Take 81 mg by mouth daily.    cholecalciferol (VITAMIN D) 1000 UNITS tablet Take 1,000 Units by mouth every morning.   clomiPRAMINE (ANAFRANIL) 25 MG capsule Take 1 capsule (25 mg total) by mouth at bedtime.   docusate sodium (COLACE) 100 MG capsule Take 100 mg by mouth daily.   donepezil (ARICEPT) 10 MG tablet Take 10 mg by mouth daily.   Insulin Detemir (LEVEMIR FLEXTOUCH) 100 UNIT/ML Pen Inject 12 Units into the skin 2 (two) times daily. Hold for CBG <85  12 units in the am and 15 units in the pm   ketoconazole (NIZORAL) 2 % shampoo Apply 1 application topically 2 (two) times a week.   latanoprost (XALATAN) 0.005 % ophthalmic solution 1 drop at bedtime.   metoprolol tartrate (LOPRESSOR) 25 MG tablet Take 75 mg by mouth 2 (two) times daily.    Multiple Vitamins-Minerals (CENTRUM SILVER 50+MEN) TABS Take by mouth daily.   QUEtiapine (SEROQUEL) 25 MG tablet Take 0.5 tablets (12.5 mg total) by mouth at bedtime.   tamsulosin (FLOMAX) 0.4 MG CAPS Take 0.4 mg by mouth daily after breakfast.   No facility-administered encounter medications on file as of 10/01/2018.     Review of Systems  Unable to perform ROS: Dementia  Constitutional: Positive for fatigue.  Respiratory: Positive for cough. Negative for shortness of breath, wheezing and stridor.   Cardiovascular: Positive for leg swelling.  Genitourinary: Negative for difficulty urinating, dysuria and urgency.  Musculoskeletal: Positive for gait problem.  Neurological: Positive for weakness.  Psychiatric/Behavioral: Positive for confusion. Negative for agitation and behavioral problems.    Immunization History  Administered Date(s) Administered   Influenza Inj Mdck Quad Pf 03/17/2016   Influenza Whole 03/13/2013   Influenza,inj,Quad PF,6+ Mos 03/20/2018   Influenza-Unspecified 03/04/2014, 03/12/2015, 03/23/2017   PPD Test 07/05/2012   Pneumococcal Conjugate-13 07/01/2016   Pneumococcal Polysaccharide-23 07/16/1996   Td 07/01/2016   Pertinent   Health Maintenance Due  Topic Date Due   FOOT EXAM  06/01/2018   URINE MICROALBUMIN  06/02/2018   OPHTHALMOLOGY EXAM  09/28/2018   HEMOGLOBIN A1C  12/04/2018   INFLUENZA VACCINE  12/29/2018   PNA vac Low Risk Adult  Completed   Fall Risk  07/12/2018 04/28/2018 10/03/2017 09/28/2016 05/29/2015  Falls in the past year? 1 Exclusion - non ambulatory No No No  Number falls in past yr: 0 - - - -  Injury with Fall? 1 - - - -  Risk for fall due to : History of fall(s);Impaired mobility;Impaired balance/gait;Mental status change Impaired mobility - - History of fall(s)  Follow up Falls evaluation completed;Education provided;Falls prevention discussed Education provided - - -  Comment involved bathing apparatus and staff were reeducated  - - - -   Functional Status Survey:    Vitals:   10/01/18 1500  BP: 97/68  Pulse: 86  Temp: 98.8 F (37.1  C)  SpO2: 92%  Weight: 181 lb 11.2 oz (82.4 kg)   Body mass index is 25.34 kg/m. Physical Exam Vitals signs and nursing note reviewed.  Constitutional:      Appearance: Normal appearance.  HENT:     Nose: Rhinorrhea present. No congestion.     Mouth/Throat:     Mouth: Mucous membranes are dry.     Pharynx: No oropharyngeal exudate.  Cardiovascular:     Rate and Rhythm: Normal rate and regular rhythm.     Heart sounds: No murmur.  Abdominal:     General: Abdomen is flat. Bowel sounds are normal. There is no distension.     Palpations: Abdomen is soft.  Musculoskeletal:     Right lower leg: No edema.  Lymphadenopathy:     Cervical: No cervical adenopathy.  Skin:    General: Skin is warm and dry.     Coloration: Skin is pale.  Neurological:     Mental Status: He is alert.     Comments: Oriented to self and place. Able to f/c.  Left sided weakness noted due to previous CVA  Psychiatric:        Mood and Affect: Mood normal.     Labs reviewed: Recent Labs    11/24/17 06/05/18 0600 09/29/18  NA 140 142 135*  K 4.6 4.9 4.3  BUN  32* 23* 31*  CREATININE 1.2 1.2 1.4*   No results for input(s): AST, ALT, ALKPHOS, BILITOT, PROT, ALBUMIN in the last 8760 hours. Recent Labs    11/24/17 09/29/18 10/01/18  WBC 7.7 13.6 10.7  NEUTROABS  --  10  --   HGB 11.7* 10.8* 11.4*  HCT 34* 32* 32*  PLT 189 225 225   Lab Results  Component Value Date   TSH 1.67 03/05/2018   Lab Results  Component Value Date   HGBA1C 7.8 06/05/2018   Lab Results  Component Value Date   CHOL 152 06/27/2013   HDL 55 06/27/2013   LDLCALC 78 06/27/2013   TRIG 83 06/27/2013    Significant Diagnostic Results in last 30 days:  No results found.  Assessment/Plan 1. Hypoxia Improved with oxygen. ? Possible aspiration due to cough with thin liquids.  Since his xray is normal and his symptoms improved would just monitor clinically. Would treat with Augmentin if he worsens. Maintain full isolation with N95 until 2nd covid sample returns negative.    2. Dehydration Mild, encourage oral fluids   3. Dysphagia I discussed this issue with his daughter Webb Silversmith. He is coughing on thin liquids and would benefit from evaluation by speech therapy.    4. Advanced care planning/discussion.  Mr. Baquero has a hx of CVA with left sided weakness. He has been coughing with thin liquids. He had an episode of sob, hypoxia, and low grade temp on 5/2.  He is improved but still requiring oxygen.  I discussed his goals of care with his daughter, Webb Silversmith. She would like to avoid hospitalizations. She expressed that if he had an infection she would want it treated. We discussed his QOL and how this may be affected by diet changes due to dysphagia. We discussed a possible ST eval. She will discuss this with her husband and get [facility].  16 min was spent in counseling and coordination regarding this care plan.   Family/ staff Communication: daughter Webb Silversmith  Labs/tests ordered:  Second covid screen in 48 hrs

## 2018-10-16 ENCOUNTER — Encounter: Payer: Self-pay | Admitting: Internal Medicine

## 2018-10-16 ENCOUNTER — Non-Acute Institutional Stay (SKILLED_NURSING_FACILITY): Payer: Medicare Other | Admitting: Internal Medicine

## 2018-10-16 DIAGNOSIS — R3981 Functional urinary incontinence: Secondary | ICD-10-CM

## 2018-10-16 DIAGNOSIS — R1310 Dysphagia, unspecified: Secondary | ICD-10-CM | POA: Diagnosis not present

## 2018-10-16 DIAGNOSIS — E1122 Type 2 diabetes mellitus with diabetic chronic kidney disease: Secondary | ICD-10-CM

## 2018-10-16 DIAGNOSIS — N183 Chronic kidney disease, stage 3 unspecified: Secondary | ICD-10-CM

## 2018-10-16 DIAGNOSIS — G309 Alzheimer's disease, unspecified: Secondary | ICD-10-CM | POA: Diagnosis not present

## 2018-10-16 DIAGNOSIS — D638 Anemia in other chronic diseases classified elsewhere: Secondary | ICD-10-CM

## 2018-10-16 DIAGNOSIS — G8194 Hemiplegia, unspecified affecting left nondominant side: Secondary | ICD-10-CM

## 2018-10-16 DIAGNOSIS — N400 Enlarged prostate without lower urinary tract symptoms: Secondary | ICD-10-CM

## 2018-10-16 DIAGNOSIS — F0150 Vascular dementia without behavioral disturbance: Secondary | ICD-10-CM

## 2018-10-16 DIAGNOSIS — Z0000 Encounter for general adult medical examination without abnormal findings: Secondary | ICD-10-CM

## 2018-10-16 DIAGNOSIS — F3176 Bipolar disorder, in full remission, most recent episode depressed: Secondary | ICD-10-CM

## 2018-10-16 DIAGNOSIS — F0280 Dementia in other diseases classified elsewhere without behavioral disturbance: Secondary | ICD-10-CM

## 2018-10-16 DIAGNOSIS — Z794 Long term (current) use of insulin: Secondary | ICD-10-CM

## 2018-10-16 NOTE — Progress Notes (Signed)
Patient ID: George Kemp, male   DOB: 1927/05/03, 85 y.o.   MRN: [id number]  Provider:  Rexene Edison. Mariea Clonts, D.O., C.M.D. Location: [place] Room Number: 681 Place of Service:  SNF (31)   PCP: Gayland Curry, DO Patient Care Team: Gayland Curry, DO as PCP - General (Geriatric Medicine) Community, [name]  Extended Emergency Contact Information Primary Emergency Contact: Fleming,Anne Address: [address]          [place], [place] [address] [name] of [place] Phone: [phone] Mobile Phone: [phone] Relation: Daughter  Code Status: DNR, MOST Goals of Care: Advanced Directive information Advanced Directives 07/03/2018  Does Patient Have a Medical Advance Directive? Yes  Type of Paramedic of [place];Out of facility DNR (pink MOST or yellow form)  Does patient want to make changes to medical advance directive? No - Patient declined  Copy of [place] in Chart? Yes - validated most recent copy scanned in chart (See row information)  Pre-existing out of facility DNR order (yellow form or pink MOST form) Yellow form placed in chart (order not valid for inpatient use)     Chief Complaint  Patient presents with  . Annual Exam    CPE    HPI: Patient is a 85 y.o. male seen today for an annual comprehensive examination.  He has a h/o stroke, vascular dementia, diabetes mellitus 2, CKD, , Jeniel is doing ok overall.    He had an episode of volume overload recently requiring diuresis--this seemed to explain his sudden hypoxia, but did not explain the fever he had the first weekend in May.  His covid tests were negative x 2.  I had suspected aspiration.    His glucose readings fluctuate due to sweet snacks, but his hba1c has remained within goal for an 85 yo in long-term care at risk for hypoglycemia.  He receives levemir 12 units twice a day.  He's not had any lows.  He's on chronic  seroquel for bipolar and has not done well when this or his anafranil is discontinued.    His memory loss and dementia have progressed.  He is less clear and speech more challenging to understand.  He remains pleasant and smiles when visited.    He does not complain of pain.  Today, I noted both hands being swollen when typically only the left will swell (left hemiparesis from prior cva). Of note, there have been more high sodium foods in the prepared meals and snacks the residents have received due to covid.  Family also provides him with foods he enjoys for his quality of life.  His weight has not changed considerably.  Past Medical History:  Diagnosis Date  . Anemia, iron deficiency 12/30/2012  . Basal cell carcinoma   . Bipolar 1 disorder ([place])   . CKD (chronic kidney disease), stage II 07/09/2012   07/16/12:   stage II chronic kidney disease by GFR.  Follow lab at interval    . Dementia ([place])   . Diabetes ([place])   . GI bleed   . Hypertension   . Mixed Alzheimer's and vascular dementia ([place]) 12/01/2012   06/07/12: STML re: CVA- contribute to debility/dependence. Pt. is aware of deficit. Will benefit from [place] environment for assistance, supervision, socialization    07/16/12:  Admission MDS reviewed: BIMS score 13/15,  functional status: Pt requires limited to extensive assist with all ADLs except eating (supervision)    .  Pressure ulcer of sacrum 12/07/2012  . Squamous cell carcinoma of skin of scalp 01/24/14   anterior and posterior scalp   . Stroke [facility]) 08/2009   Left side with residual deficit   Past Surgical History:  Procedure Laterality Date  . HIP ARTHROPLASTY Left 12/01/2012   Procedure: ARTHROPLASTY BIPOLAR HIP;  Surgeon: Johnn Hai, MD;  [facility]: [facility];  Service: Orthopedics;  Laterality: Left;  . SKIN LESION EXCISION  01/24/14   Anterior and posterio scalp Squamous cell [name] PA    reports that he has quit smoking. He smoked 1.00 pack per day. He has never  used smokeless tobacco. He reports that he does not drink alcohol or use drugs. Social History   Socioeconomic History  . Marital status: Widowed    Spouse name: Not on file  . Number of children: Not on file  . Years of education: Not on file  . Highest education level: Not on file  Occupational History  . Occupation: retired Engineer, maintenance (IT)  Social Needs  . Financial resource strain: Not hard at all  . Food insecurity:    Worry: Never true    Inability: Never true  . Transportation needs:    Medical: No    Non-medical: No  Tobacco Use  . Smoking status: Former Smoker    Packs/day: 1.00  . Smokeless tobacco: Never Used  Substance and Sexual Activity  . Alcohol use: No  . Drug use: No  . Sexual activity: Never  Lifestyle  . Physical activity:    Days per week: 0 days    Minutes per session: 0 min  . Stress: Only a little  Relationships  . Social connections:    Talks on phone: Once a week    Gets together: Once a week    Attends religious service: Never    Active member of club or organization: No    Attends meetings of clubs or organizations: Never    Relationship status: Married  Other Topics Concern  . Not on file  Social History Narrative   His wife passed away in September 16, 2015. Resides at [facility], Skilled nursing section since 05/2012.  He is a retired Engineer, maintenance (IT). Stopped smoking many years ago, drinks minimal alcohol. Pt. Has Advanced Directives: DNR.   Family History  Problem Relation Age of Onset  . Stroke Mother   . Congestive Heart Failure Mother   . Cancer - Other Father        Unknown  Type  . Cancer Father   . Stroke Brother         X 2  . Diabetes Brother   . Thyroid disease Daughter     Pertinent  Health Maintenance Due  Topic Date Due  . FOOT EXAM  06/01/2018  . URINE MICROALBUMIN  06/02/2018  . OPHTHALMOLOGY EXAM  09/28/2018  . HEMOGLOBIN A1C  12/04/2018  . INFLUENZA VACCINE  12/29/2018  . PNA vac Low Risk Adult  Completed   Fall Risk   07/12/2018 04/28/2018 10/03/2017 09/28/2016 05/29/2015  Falls in the past year? 1 Exclusion - non ambulatory No No No  Number falls in past yr: 0 - - - -  Injury with Fall? 1 - - - -  Risk for fall due to : History of fall(s);Impaired mobility;Impaired balance/gait;Mental status change Impaired mobility - - History of fall(s)  Follow up Falls evaluation completed;Education provided;Falls prevention discussed Education provided - - -  Comment involved bathing apparatus and staff were reeducated  - - - -  Depression screen [facility] 2/9 04/28/2018 04/28/2018 10/03/2017 09/28/2016 05/29/2015  Decreased Interest 0 0 0 0 0  Down, Depressed, Hopeless 0 0 0 0 0  PHQ - 2 Score 0 0 0 0 0    Functional Status Survey:    No Known Allergies  Outpatient Encounter Medications as of 10/16/2018  Medication Sig  . acetaminophen (TYLENOL) 325 MG tablet Take 650 mg by mouth every 6 (six) hours as needed for pain.  Marland Kitchen antiseptic oral rinse (BIOTENE) LIQD 15 mLs by Mouth Rinse route 4 (four) times daily.  Marland Kitchen aspirin 81 MG tablet Take 81 mg by mouth daily.  . cholecalciferol (VITAMIN D) 1000 UNITS tablet Take 1,000 Units by mouth every morning.  . clomiPRAMINE (ANAFRANIL) 25 MG capsule Take 1 capsule (25 mg total) by mouth at bedtime.  . docusate sodium (COLACE) 100 MG capsule Take 100 mg by mouth daily.  Marland Kitchen donepezil (ARICEPT) 10 MG tablet Take 10 mg by mouth daily.  . Insulin Detemir (LEVEMIR FLEXTOUCH) 100 UNIT/ML Pen Inject 12 Units into the skin 2 (two) times daily. Hold for CBG <85  12 units in the am and 15 units in the pm  . ketoconazole (NIZORAL) 2 % shampoo Apply 1 application topically 2 (two) times a week.  . latanoprost (XALATAN) 0.005 % ophthalmic solution 1 drop at bedtime.  . metoprolol tartrate (LOPRESSOR) 25 MG tablet Take 75 mg by mouth 2 (two) times daily.   . Multiple Vitamins-Minerals (CENTRUM SILVER 50+MEN) TABS Take by mouth daily.  . QUEtiapine (SEROQUEL) 25 MG tablet Take 0.5 tablets (12.5 mg  total) by mouth at bedtime.  . tamsulosin (FLOMAX) 0.4 MG CAPS Take 0.4 mg by mouth daily after breakfast.   No facility-administered encounter medications on file as of 10/16/2018.     Review of Systems  Constitutional: Positive for fatigue. Negative for activity change, appetite change, chills, fever and unexpected weight change.  HENT: Positive for trouble swallowing. Negative for congestion.   Eyes: Negative for visual disturbance.       Glasses  Respiratory: Negative for chest tightness and shortness of breath.   Gastrointestinal: Negative for abdominal pain, constipation, diarrhea, nausea and vomiting.  Genitourinary: Negative for dysuria.  Musculoskeletal: Positive for arthralgias and gait problem. Negative for joint swelling.  Skin: Negative for color change.  Neurological: Positive for speech difficulty and weakness. Negative for dizziness.       Chronic hemiparesis  Psychiatric/Behavioral: Positive for confusion. Negative for agitation, behavioral problems and sleep disturbance. The patient is not nervous/anxious.     Vitals:   10/16/18 1507  BP: 119/74  Pulse: 89  Resp: 20  Temp: 97.8 F (36.6 C)  TempSrc: Oral  SpO2: 97%  Weight: 181 lb (82.1 kg)  Height: 5\' 11"  (1.803 m)   Body mass index is 25.24 kg/m. Physical Exam Vitals signs reviewed.  Constitutional:      General: He is not in acute distress.    Appearance: Normal appearance. He is normal weight. He is not ill-appearing or toxic-appearing.  HENT:     Head: Normocephalic and atraumatic.     Right Ear: External ear normal.     Left Ear: External ear normal.     Nose: Nose normal.     Mouth/Throat:     Pharynx: Oropharynx is clear. No oropharyngeal exudate.  Eyes:     Extraocular Movements: Extraocular movements intact.     Conjunctiva/sclera: Conjunctivae normal.     Pupils: Pupils are equal, round, and reactive to light.  Comments: glasses  Cardiovascular:     Rate and Rhythm: Normal rate and  regular rhythm.     Pulses: Normal pulses.     Heart sounds: Normal heart sounds.  Pulmonary:     Effort: Pulmonary effort is normal.     Breath sounds: Normal breath sounds. No wheezing, rhonchi or rales.  Abdominal:     General: Bowel sounds are normal.     Palpations: Abdomen is soft.  Musculoskeletal:     Right lower leg: No edema.     Left lower leg: No edema.     Comments: Left hemiparesis; seated in manual wheechair in room; mild edema noted of both hands (they were hanging down some)  Skin:    General: Skin is warm and dry.  Neurological:     Mental Status: He is alert.     Labs reviewed: Basic Metabolic Panel: Recent Labs    11/24/17 06/05/18 0600 09/29/18  NA 140 142 135*  K 4.6 4.9 4.3  BUN 32* 23* 31*  CREATININE 1.2 1.2 1.4*   Liver Function Tests: No results for input(s): AST, ALT, ALKPHOS, BILITOT, PROT, ALBUMIN in the last 8760 hours. No results for input(s): LIPASE, AMYLASE in the last 8760 hours. No results for input(s): AMMONIA in the last 8760 hours. CBC: Recent Labs    11/24/17 09/29/18 10/01/18  WBC 7.7 13.6 10.7  NEUTROABS  --  10  --   HGB 11.7* 10.8* 11.4*  HCT 34* 32* 32*  PLT 189 225 225   Cardiac Enzymes: No results for input(s): CKTOTAL, CKMB, CKMBINDEX, TROPONINI in the last 8760 hours. BNP: Invalid input(s): POCBNP Lab Results  Component Value Date   HGBA1C 7.8 06/05/2018   Lab Results  Component Value Date   TSH 1.67 03/05/2018   Assessment/Plan 1. Annual physical exam -performed today  -up to date on vaccines except shingrix which was refused by his daughter per nursing  2. Type 2 diabetes mellitus with stage 3 chronic kidney disease, with long-term current use of insulin (HCC) -check hba1c twice a year -goal less than 8.5 due to frailty, other comorbidities and comfort goals  3. CKD (chronic kidney disease) stage 3, GFR 30-59 ml/min (HCC) -Avoid nephrotoxic agents like nsaids, dose adjust renally excreted meds,  hydrate.  4. Mixed Alzheimer's and vascular dementia ([place]) -cont aricept therapy -if he had an abrupt decline, might consider stopping it due to his adl dependence but not currently the case  5. Dysphagia, unspecified type -continue aspiration precautions; snacks from family for quality of life  6. Bipolar 1 disorder, depressed, full remission ([place]) -stable on his anafranil and seroquel and has not tolerated changes well--will leave this alone   7. Left hemiparesis ([place]) -ongoing from prior CVA, cont ADL support in snf  8. Anemia of chronic disease -due to ckd -continue to monitor 1-2 times a year  9. Benign prostatic hyperplasia without lower urinary tract symptoms -continues on flomax daily after breakfast  10. Urinary incontinence due to immobility -continue good incontinence care and adult undergarments  Family/ staff Communication: discussed with snf nurse  Labs/tests ordered:  No new  Joselynn Amoroso L. Gean Laursen, D.O. [facility] [address], [place] [address] Cell Phone (Mon-Fri 8am-5pm):  [phone] On Call:  [phone] & follow prompts after 5pm & weekends Office Phone:  [phone] Office Fax:  [phone]

## 2018-11-15 ENCOUNTER — Non-Acute Institutional Stay (SKILLED_NURSING_FACILITY): Payer: Medicare Other | Admitting: Adult Health

## 2018-11-15 DIAGNOSIS — F3176 Bipolar disorder, in full remission, most recent episode depressed: Secondary | ICD-10-CM

## 2018-11-15 DIAGNOSIS — N183 Chronic kidney disease, stage 3 unspecified: Secondary | ICD-10-CM

## 2018-11-15 DIAGNOSIS — Z794 Long term (current) use of insulin: Secondary | ICD-10-CM

## 2018-11-15 DIAGNOSIS — I10 Essential (primary) hypertension: Secondary | ICD-10-CM

## 2018-11-15 DIAGNOSIS — D638 Anemia in other chronic diseases classified elsewhere: Secondary | ICD-10-CM | POA: Diagnosis not present

## 2018-11-15 DIAGNOSIS — E1122 Type 2 diabetes mellitus with diabetic chronic kidney disease: Secondary | ICD-10-CM | POA: Diagnosis not present

## 2018-11-19 ENCOUNTER — Encounter: Payer: Self-pay | Admitting: Adult Health

## 2018-11-19 NOTE — Progress Notes (Signed)
Location:  Occupational psychologist of Service:  SNF (31) Provider:   Cindi Carbon, ANP [place] [phone]  Gayland Curry, DO  Patient Care Team: Gayland Curry, DO as PCP - General (Geriatric Medicine) Fay Records, Well [name]  Extended Emergency Contact Information Primary Emergency Contact: [facility] Address: [address]          [place], [place] [address] [name] of [place] Phone: [phone] Mobile Phone: [phone] Relation: Daughter  Code Status: DNR Goals of care: Advanced Directive information Advanced Directives 07/03/2018  Does Patient Have a Medical Advance Directive? Yes  Type of Paramedic of [place];Out of facility DNR (pink MOST or yellow form)  Does patient want to make changes to medical advance directive? No - Patient declined  Copy of [place] in Chart? Yes - validated most recent copy scanned in chart (See row information)  Pre-existing out of facility DNR order (yellow form or pink MOST form) Yellow form placed in chart (order not valid for inpatient use)     Chief Complaint  Patient presents with   Medical Management of Chronic Issues    HPI:  Pt is a 85 y.o. male seen today for medical management of chronic diseases.    He has a hx of CVA with left sided weakness and vascular dementia. His nurse reports he is doing well in the skilled care unit, although there is progressive decline in his memory. His blood sugars range 140-180 in the morning, 205-313 in the evening. He likes to eat desserts. He has no complaints of pain, discomfort, sob, cp, depression, anxiety, sleep issues, and his overall appetite is good.   Functional status: hoyer lift, incontinent  Past Medical History:  Diagnosis Date   Anemia, iron deficiency 12/30/2012   Basal cell carcinoma    Bipolar 1 disorder (HCC)    CKD (chronic kidney disease), stage II 07/09/2012   07/16/12:   stage II chronic kidney disease by GFR.  Follow lab at interval     Dementia ([place])    Diabetes ([place])    GI bleed    Hypertension    Mixed Alzheimer's and vascular dementia ([place]) 12/01/2012   06/07/12: STML re: CVA- contribute to debility/dependence. Pt. is aware of deficit. Will benefit from [place] environment for assistance, supervision, socialization    07/16/12:  Admission MDS reviewed: BIMS score 13/15,  functional status: Pt requires limited to extensive assist with all ADLs except eating (supervision)     Pressure ulcer of sacrum 12/07/2012   Squamous cell carcinoma of skin of scalp 01/24/14   anterior and posterior scalp    Stroke ([place]) 08/2009   Left side with residual deficit   Past Surgical History:  Procedure Laterality Date   HIP ARTHROPLASTY Left 12/01/2012   Procedure: ARTHROPLASTY BIPOLAR HIP;  Surgeon: Johnn Hai, MD;  Location: [facility];  Service: Orthopedics;  Laterality: Left;   SKIN LESION EXCISION  01/24/14   Anterior and posterio scalp Squamous cell [name] PA    No Known Allergies  Outpatient Encounter Medications as of 11/15/2018  Medication Sig   acetaminophen (TYLENOL) 325 MG tablet Take 650 mg by mouth every 6 (six) hours as needed for pain.   antiseptic oral rinse (BIOTENE) LIQD 15 mLs by Mouth Rinse route 4 (four) times daily.   aspirin 81 MG tablet Take 81 mg by mouth daily.   cholecalciferol (VITAMIN D) 1000 UNITS tablet Take 1,000 Units  by mouth every morning.   clomiPRAMINE (ANAFRANIL) 25 MG capsule Take 1 capsule (25 mg total) by mouth at bedtime.   docusate sodium (COLACE) 100 MG capsule Take 100 mg by mouth daily.   donepezil (ARICEPT) 10 MG tablet Take 10 mg by mouth daily.   Insulin Detemir (LEVEMIR FLEXTOUCH) 100 UNIT/ML Pen Inject 15 Units into the skin 2 (two) times daily. Hold for CBG <85  15 units in the am and 15 units in the pm   ketoconazole (NIZORAL) 2 % shampoo Apply 1 application topically 2 (two)  times a week.   latanoprost (XALATAN) 0.005 % ophthalmic solution 1 drop at bedtime.   metoprolol tartrate (LOPRESSOR) 25 MG tablet Take 75 mg by mouth 2 (two) times daily.    Multiple Vitamins-Minerals (CENTRUM SILVER 50+MEN) TABS Take by mouth daily.   QUEtiapine (SEROQUEL) 25 MG tablet Take 0.5 tablets (12.5 mg total) by mouth at bedtime.   tamsulosin (FLOMAX) 0.4 MG CAPS Take 0.4 mg by mouth daily after breakfast.   No facility-administered encounter medications on file as of 11/15/2018.     Review of Systems  Constitutional: Negative for activity change, appetite change, chills, diaphoresis, fatigue, fever and unexpected weight change.  Respiratory: Negative for cough, shortness of breath, wheezing and stridor.   Cardiovascular: Positive for leg swelling. Negative for chest pain and palpitations.  Gastrointestinal: Negative for abdominal distention, abdominal pain, constipation and diarrhea.  Genitourinary: Negative for difficulty urinating and dysuria.  Musculoskeletal: Positive for gait problem. Negative for arthralgias, back pain, joint swelling and myalgias.  Neurological: Positive for facial asymmetry and weakness. Negative for dizziness, seizures, syncope, speech difficulty and headaches.  Hematological: Negative for adenopathy. Does not bruise/bleed easily.  Psychiatric/Behavioral: Positive for confusion. Negative for agitation, behavioral problems, dysphoric mood, hallucinations, self-injury and sleep disturbance. The patient is not nervous/anxious and is not hyperactive.     Immunization History  Administered Date(s) Administered   Influenza Inj Mdck Quad Pf 03/17/2016   Influenza Whole 03/13/2013   Influenza,inj,Quad PF,6+ Mos 03/20/2018   Influenza-Unspecified 03/04/2014, 03/12/2015, 03/23/2017   PPD Test 07/05/2012   Pneumococcal Conjugate-13 07/01/2016   Pneumococcal Polysaccharide-23 07/16/1996   Td 07/01/2016   Zoster Recombinat (Shingrix) 06/30/2017     Pertinent  Health Maintenance Due  Topic Date Due   FOOT EXAM  06/01/2018   URINE MICROALBUMIN  06/02/2018   OPHTHALMOLOGY EXAM  09/28/2018   HEMOGLOBIN A1C  12/04/2018   INFLUENZA VACCINE  12/29/2018   PNA vac Low Risk Adult  Completed   Fall Risk  07/12/2018 04/28/2018 10/03/2017 09/28/2016 05/29/2015  Falls in the past year? 1 Exclusion - non ambulatory No No No  Number falls in past yr: 0 - - - -  Injury with Fall? 1 - - - -  Risk for fall due to : History of fall(s);Impaired mobility;Impaired balance/gait;Mental status change Impaired mobility - - History of fall(s)  Follow up Falls evaluation completed;Education provided;Falls prevention discussed Education provided - - -  Comment involved bathing apparatus and staff were reeducated  - - - -   Functional Status Survey:    Vitals:   11/19/18 1519  BP: (!) 148/80  Pulse: 85  Resp: 18  Temp: 98.2 F (36.8 C)  Weight: 180 lb 4.8 oz (81.8 kg)   Body mass index is 25.15 kg/m. Physical Exam Vitals signs and nursing note reviewed.  Constitutional:      General: He is not in acute distress.    Appearance: He is not diaphoretic.  HENT:  Head: Normocephalic and atraumatic.     Mouth/Throat:     Mouth: Mucous membranes are moist.     Pharynx: Oropharynx is clear. No oropharyngeal exudate.  Neck:     Musculoskeletal: Normal range of motion and neck supple.     Thyroid: No thyromegaly.     Vascular: No JVD.     Trachea: No tracheal deviation.  Cardiovascular:     Rate and Rhythm: Normal rate and regular rhythm.     Heart sounds: No murmur.  Pulmonary:     Effort: Pulmonary effort is normal. No respiratory distress.     Breath sounds: Normal breath sounds. No wheezing.  Abdominal:     General: Bowel sounds are normal. There is no distension.     Palpations: Abdomen is soft.     Tenderness: There is no abdominal tenderness.  Musculoskeletal:     Right lower leg: No edema.     Left lower leg: Edema present.   Lymphadenopathy:     Cervical: No cervical adenopathy.  Skin:    General: Skin is warm and dry.  Neurological:     Mental Status: He is alert. Mental status is at baseline.     Comments: Left sided weakness Oriented to self and place not time  Psychiatric:        Mood and Affect: Mood normal.     Labs reviewed: Recent Labs    11/24/17 06/05/18 0600 09/29/18  NA 140 142 135*  K 4.6 4.9 4.3  BUN 32* 23* 31*  CREATININE 1.2 1.2 1.4*   No results for input(s): AST, ALT, ALKPHOS, BILITOT, PROT, ALBUMIN in the last 8760 hours. Recent Labs    11/24/17 09/29/18 10/01/18  WBC 7.7 13.6 10.7  NEUTROABS  --  10  --   HGB 11.7* 10.8* 11.4*  HCT 34* 32* 32*  PLT 189 225 225   Lab Results  Component Value Date   TSH 1.67 03/05/2018   Lab Results  Component Value Date   HGBA1C 7.8 06/05/2018   Lab Results  Component Value Date   CHOL 152 06/27/2013   HDL 55 06/27/2013   LDLCALC 78 06/27/2013   TRIG 83 06/27/2013    Significant Diagnostic Results in last 30 days:  No results found.  Assessment/Plan 1. Type 2 diabetes mellitus with stage 3 chronic kidney disease, with long-term current use of insulin (HCC) Lab Results  Component Value Date   HGBA1C 7.8 06/05/2018  Goal <8%.  Not compliant with diet recommendations Continue Levemir 15 units bid   2. Anemia of chronic disease Lab Results  Component Value Date   HGB 11.4 (A) 10/01/2018  Continue to monitor   3. CKD (chronic kidney disease) stage 3, GFR 30-59 ml/min (HCC) Continue to periodically monitor BMP and avoid nephrotoxic agents  4. Bipolar 1 disorder, depressed, full remission ([place]) Well controlled mood, has not tolerated taper in the past. Continue current dose of Anafranil and Seroquel   5. Essential hypertension Controlled, continue Lopressor 75 mg bid     Family/ staff Communication: resident and staff  Labs/tests ordered:  NA

## 2018-12-07 ENCOUNTER — Non-Acute Institutional Stay (SKILLED_NURSING_FACILITY): Payer: Medicare Other | Admitting: Adult Health

## 2018-12-07 ENCOUNTER — Encounter: Payer: Self-pay | Admitting: Adult Health

## 2018-12-07 DIAGNOSIS — N183 Chronic kidney disease, stage 3 unspecified: Secondary | ICD-10-CM

## 2018-12-07 DIAGNOSIS — Z794 Long term (current) use of insulin: Secondary | ICD-10-CM

## 2018-12-07 DIAGNOSIS — F0150 Vascular dementia without behavioral disturbance: Secondary | ICD-10-CM

## 2018-12-07 DIAGNOSIS — F0280 Dementia in other diseases classified elsewhere without behavioral disturbance: Secondary | ICD-10-CM

## 2018-12-07 DIAGNOSIS — G309 Alzheimer's disease, unspecified: Secondary | ICD-10-CM | POA: Diagnosis not present

## 2018-12-07 DIAGNOSIS — E1122 Type 2 diabetes mellitus with diabetic chronic kidney disease: Secondary | ICD-10-CM

## 2018-12-07 DIAGNOSIS — I10 Essential (primary) hypertension: Secondary | ICD-10-CM

## 2018-12-07 DIAGNOSIS — F3177 Bipolar disorder, in partial remission, most recent episode mixed: Secondary | ICD-10-CM

## 2018-12-07 DIAGNOSIS — K5901 Slow transit constipation: Secondary | ICD-10-CM

## 2018-12-07 NOTE — Progress Notes (Signed)
Location:  Occupational psychologist of Service:  SNF (31) Provider:   Cindi Carbon, ANP [place] [phone]   Gayland Curry, DO  Patient Care Team: Gayland Curry, DO as PCP - General (Geriatric Medicine) Fay Records, Well [name]  Extended Emergency Contact Information Primary Emergency Contact: [facility] Address: [address]          [place], [place] [address] [name] of [place] Phone: [phone] Mobile Phone: [phone] Relation: Daughter  Code Status: DNR Goals of care: Advanced Directive information Advanced Directives 07/03/2018  Does Patient Have a Medical Advance Directive? Yes  Type of Paramedic of [place];Out of facility DNR (pink MOST or yellow form)  Does patient want to make changes to medical advance directive? No - Patient declined  Copy of [place] in Chart? Yes - validated most recent copy scanned in chart (See row information)  Pre-existing out of facility DNR order (yellow form or pink MOST form) Yellow form placed in chart (order not valid for inpatient use)     Chief Complaint  Patient presents with  . Medical Management of Chronic Issues    HPI:  Pt is a 85 y.o. male seen today for medical management of chronic diseases.   He has a hx of vascular dementia and CVA with left sided weakness. The nurse reports that he is talking less and sleeping more. Last MMSE 16/30 in Jan of 2020.  Bipolar: mood is stable with no delusions, dysphoria, or mania. Appetite is fair. Sleeping well BP controlled Blood sugars run over 200 at times in the evening, still eating sweets Staff reports he has runny stools regularly  Functional status: hoyer lift, incontinent Past Medical History:  Diagnosis Date  . Anemia, iron deficiency 12/30/2012  . Basal cell carcinoma   . Bipolar 1 disorder ([place])   . CKD (chronic kidney disease), stage II 07/09/2012   07/16/12:    stage II chronic kidney disease by GFR.  Follow lab at interval    . Dementia ([place])   . Diabetes ([place])   . GI bleed   . Hypertension   . Mixed Alzheimer's and vascular dementia ([place]) 12/01/2012   06/07/12: STML re: CVA- contribute to debility/dependence. Pt. is aware of deficit. Will benefit from [place] environment for assistance, supervision, socialization    07/16/12:  Admission MDS reviewed: BIMS score 13/15,  functional status: Pt requires limited to extensive assist with all ADLs except eating (supervision)    . Pressure ulcer of sacrum 12/07/2012  . Squamous cell carcinoma of skin of scalp 01/24/14   anterior and posterior scalp   . Stroke [facility]) 08/2009   Left side with residual deficit   Past Surgical History:  Procedure Laterality Date  . HIP ARTHROPLASTY Left 12/01/2012   Procedure: ARTHROPLASTY BIPOLAR HIP;  Surgeon: Johnn Hai, MD;  Location: [facility];  Service: Orthopedics;  Laterality: Left;  . SKIN LESION EXCISION  01/24/14   Anterior and posterio scalp Squamous cell [name] PA    No Known Allergies  Outpatient Encounter Medications as of 12/07/2018  Medication Sig  . aspirin 81 MG tablet Take 81 mg by mouth daily.  . cholecalciferol (VITAMIN D) 1000 UNITS tablet Take 1,000 Units by mouth every morning.  . clomiPRAMINE (ANAFRANIL) 25 MG capsule Take 1 capsule (25 mg total) by mouth at bedtime.  . docusate sodium (COLACE) 100 MG capsule Take 100 mg by mouth daily.  Marland Kitchen donepezil (  ARICEPT) 10 MG tablet Take 10 mg by mouth daily.  . Insulin Detemir (LEVEMIR FLEXTOUCH) 100 UNIT/ML Pen Inject 15 Units into the skin 2 (two) times daily. Hold for CBG <85  15 units in the am and 15 units in the pm  . ketoconazole (NIZORAL) 2 % shampoo Apply 1 application topically 2 (two) times a week.  . latanoprost (XALATAN) 0.005 % ophthalmic solution 1 drop at bedtime.  . metoprolol tartrate (LOPRESSOR) 25 MG tablet Take 75 mg by mouth 2 (two) times daily.   . Multiple  Vitamins-Minerals (CENTRUM SILVER 50+MEN) TABS Take by mouth daily.  . QUEtiapine (SEROQUEL) 25 MG tablet Take 0.5 tablets (12.5 mg total) by mouth at bedtime.  . tamsulosin (FLOMAX) 0.4 MG CAPS Take 0.4 mg by mouth daily after breakfast.  . acetaminophen (TYLENOL) 325 MG tablet Take 650 mg by mouth every 6 (six) hours as needed for pain.  Marland Kitchen antiseptic oral rinse (BIOTENE) LIQD 15 mLs by Mouth Rinse route 4 (four) times daily.   No facility-administered encounter medications on file as of 12/07/2018.     Review of Systems  Constitutional: Negative for activity change, appetite change, chills, diaphoresis, fatigue, fever and unexpected weight change.  Respiratory: Negative for cough, shortness of breath, wheezing and stridor.   Cardiovascular: Positive for leg swelling. Negative for chest pain and palpitations.  Gastrointestinal: Positive for diarrhea. Negative for abdominal distention, abdominal pain and constipation.  Genitourinary: Negative for difficulty urinating and dysuria.  Musculoskeletal: Positive for gait problem. Negative for arthralgias, back pain, joint swelling and myalgias.  Neurological: Positive for weakness. Negative for dizziness, seizures, syncope, facial asymmetry, speech difficulty and headaches.  Hematological: Negative for adenopathy. Does not bruise/bleed easily.  Psychiatric/Behavioral: Positive for confusion. Negative for agitation and behavioral problems.    Immunization History  Administered Date(s) Administered  . Influenza Inj Mdck Quad Pf 03/17/2016  . Influenza Whole 03/13/2013  . Influenza,inj,Quad PF,6+ Mos 03/20/2018  . Influenza-Unspecified 03/04/2014, 03/12/2015, 03/23/2017  . PPD Test 07/05/2012  . Pneumococcal Conjugate-13 07/01/2016  . Pneumococcal Polysaccharide-23 07/16/1996  . Td 07/01/2016  . Zoster Recombinat (Shingrix) 06/30/2017   Pertinent  Health Maintenance Due  Topic Date Due  . FOOT EXAM  06/01/2018  . OPHTHALMOLOGY EXAM   09/28/2018  . HEMOGLOBIN A1C  12/04/2018  . URINE MICROALBUMIN  01/07/2019 (Originally 06/02/2018)  . INFLUENZA VACCINE  12/29/2018  . PNA vac Low Risk Adult  Completed   Fall Risk  07/12/2018 04/28/2018 10/03/2017 09/28/2016 05/29/2015  Falls in the past year? 1 Exclusion - non ambulatory No No No  Number falls in past yr: 0 - - - -  Injury with Fall? 1 - - - -  Risk for fall due to : History of fall(s);Impaired mobility;Impaired balance/gait;Mental status change Impaired mobility - - History of fall(s)  Follow up Falls evaluation completed;Education provided;Falls prevention discussed Education provided - - -  Comment involved bathing apparatus and staff were reeducated  - - - -   Functional Status Survey:    Vitals:   12/07/18 1332  Weight: 181 lb 8 oz (82.3 kg)   Body mass index is 25.31 kg/m. Physical Exam Vitals signs and nursing note reviewed.  Constitutional:      General: He is not in acute distress.    Appearance: He is not diaphoretic.  HENT:     Head: Normocephalic and atraumatic.  Neck:     Thyroid: No thyromegaly.     Vascular: No JVD.     Trachea: No tracheal  deviation.  Cardiovascular:     Rate and Rhythm: Normal rate and regular rhythm.     Heart sounds: No murmur.  Pulmonary:     Effort: Pulmonary effort is normal. No respiratory distress.     Breath sounds: Normal breath sounds. No wheezing.  Abdominal:     General: Bowel sounds are normal. There is no distension.     Palpations: Abdomen is soft.     Tenderness: There is no abdominal tenderness.  Musculoskeletal:     Left lower leg: Edema present.  Lymphadenopathy:     Cervical: No cervical adenopathy.  Skin:    General: Skin is warm and dry.  Neurological:     Mental Status: He is alert.     Comments: Left sided weakness. Oriented to self. Falls asleep in the visit.   Psychiatric:        Mood and Affect: Mood normal.     Labs reviewed: Recent Labs    06/05/18 0600 09/29/18  NA 142 135*  K  4.9 4.3  BUN 23* 31*  CREATININE 1.2 1.4*   No results for input(s): AST, ALT, ALKPHOS, BILITOT, PROT, ALBUMIN in the last 8760 hours. Recent Labs    09/29/18 10/01/18  WBC 13.6 10.7  NEUTROABS 10  --   HGB 10.8* 11.4*  HCT 32* 32*  PLT 225 225   Lab Results  Component Value Date   TSH 1.67 03/05/2018   Lab Results  Component Value Date   HGBA1C 7.8 06/05/2018   Lab Results  Component Value Date   CHOL 152 06/27/2013   HDL 55 06/27/2013   LDLCALC 78 06/27/2013   TRIG 83 06/27/2013    Significant Diagnostic Results in last 30 days:  No results found.  Assessment/Plan 1. Type 2 diabetes mellitus with stage 3 chronic kidney disease, with long-term current use of insulin ([place]) Continues with periods of high readings in the evening depending on intake Needs A1C Continue Levemir 15 units bid  2. Mixed Alzheimer's and vascular dementia ([place]) Seems to be progressively worse in terms of alertness and and verbalization Continue aricept, may consider discontinuing in the future  3. Essential hypertension Controlled, Continue lopressor 75 mg bid  4. CKD (chronic kidney disease) stage 3, GFR 30-59 ml/min (HCC) Continue to periodically monitor BMP and avoid nephrotoxic agents  5. Bipolar disorder, in partial remission, most recent episode mixed (HCC) Mood appears stable, continue seroquel and anafranil  6. Constipation D/C colace   Family/ staff Communication: staff  Labs/tests ordered: A1C ordered   Talking less

## 2018-12-10 LAB — HEMOGLOBIN A1C: Hemoglobin A1C: 7.7

## 2019-01-15 ENCOUNTER — Encounter: Payer: Self-pay | Admitting: Internal Medicine

## 2019-01-15 ENCOUNTER — Non-Acute Institutional Stay (SKILLED_NURSING_FACILITY): Payer: Medicare Other | Admitting: Internal Medicine

## 2019-01-15 DIAGNOSIS — F0150 Vascular dementia without behavioral disturbance: Secondary | ICD-10-CM

## 2019-01-15 DIAGNOSIS — N183 Chronic kidney disease, stage 3 (moderate): Secondary | ICD-10-CM

## 2019-01-15 DIAGNOSIS — E1122 Type 2 diabetes mellitus with diabetic chronic kidney disease: Secondary | ICD-10-CM | POA: Diagnosis not present

## 2019-01-15 DIAGNOSIS — L258 Unspecified contact dermatitis due to other agents: Secondary | ICD-10-CM

## 2019-01-15 DIAGNOSIS — F0280 Dementia in other diseases classified elsewhere without behavioral disturbance: Secondary | ICD-10-CM

## 2019-01-15 DIAGNOSIS — G309 Alzheimer's disease, unspecified: Secondary | ICD-10-CM

## 2019-01-15 DIAGNOSIS — Z794 Long term (current) use of insulin: Secondary | ICD-10-CM

## 2019-01-15 DIAGNOSIS — R3981 Functional urinary incontinence: Secondary | ICD-10-CM

## 2019-01-15 DIAGNOSIS — G8194 Hemiplegia, unspecified affecting left nondominant side: Secondary | ICD-10-CM

## 2019-01-15 DIAGNOSIS — L89152 Pressure ulcer of sacral region, stage 2: Secondary | ICD-10-CM

## 2019-01-15 NOTE — Progress Notes (Deleted)
Location:  [facility] Room Number: 716 Place of Service:  SNF (31) Provider:  Freddy Spadafora L. Mariea Clonts, D.O., C.M.D.  Gayland Curry, DO  Patient Care Team: Gayland Curry, DO as PCP - General (Geriatric Medicine) [facility], [facility]  Extended Emergency Contact Information Primary Emergency Contact: [facility] Address: [address]          [place], [place] [address] [name] of [place] Phone: [phone] Mobile Phone: [phone] Relation: Daughter  Code Status:  *** Goals of care: Advanced Directive information Advanced Directives 07/03/2018  Does Patient Have a Medical Advance Directive? Yes  Type of Paramedic of [place];Out of facility DNR (pink MOST or yellow form)  Does patient want to make changes to medical advance directive? No - Patient declined  Copy of [place] in Chart? Yes - validated most recent copy scanned in chart (See row information)  Pre-existing out of facility DNR order (yellow form or pink MOST form) Yellow form placed in chart (order not valid for inpatient use)     Chief Complaint  Patient presents with  . Medical Management of Chronic Issues    Routine Visit    HPI:  Pt is a 85 y.o. male seen today for medical management of chronic diseases.     Past Medical History:  Diagnosis Date  . Anemia, iron deficiency 12/30/2012  . Basal cell carcinoma   . Bipolar 1 disorder ([place])   . CKD (chronic kidney disease), stage II 07/09/2012   07/16/12:   stage II chronic kidney disease by GFR.  Follow lab at interval    . Dementia ([place])   . Diabetes ([place])   . GI bleed   . Hypertension   . Mixed Alzheimer's and vascular dementia ([place]) 12/01/2012   06/07/12: STML re: CVA- contribute to debility/dependence. Pt. is aware of deficit. Will benefit from [place] environment for assistance, supervision, socialization    07/16/12:  Admission MDS reviewed: BIMS  score 13/15,  functional status: Pt requires limited to extensive assist with all ADLs except eating (supervision)    . Pressure ulcer of sacrum 12/07/2012  . Squamous cell carcinoma of skin of scalp 01/24/14   anterior and posterior scalp   . Stroke [facility]) 08/2009   Left side with residual deficit   Past Surgical History:  Procedure Laterality Date  . HIP ARTHROPLASTY Left 12/01/2012   Procedure: ARTHROPLASTY BIPOLAR HIP;  Surgeon: Johnn Hai, MD;  Location: [facility];  Service: Orthopedics;  Laterality: Left;  . SKIN LESION EXCISION  01/24/14   Anterior and posterio scalp Squamous cell [name] PA    No Known Allergies  Outpatient Encounter Medications as of 01/15/2019  Medication Sig  . acetaminophen (TYLENOL) 325 MG tablet Take 650 mg by mouth every 6 (six) hours as needed for pain.  Marland Kitchen antiseptic oral rinse (BIOTENE) LIQD 15 mLs by Mouth Rinse route 4 (four) times daily.  Marland Kitchen aspirin 81 MG tablet Take 81 mg by mouth daily.  . cholecalciferol (VITAMIN D) 1000 UNITS tablet Take 1,000 Units by mouth every morning.  . clomiPRAMINE (ANAFRANIL) 25 MG capsule Take 1 capsule (25 mg total) by mouth at bedtime.  . docusate sodium (COLACE) 100 MG capsule Take 100 mg by mouth daily.  Marland Kitchen donepezil (ARICEPT) 10 MG tablet Take 10 mg by mouth daily.  . Insulin Detemir (LEVEMIR FLEXTOUCH) 100 UNIT/ML Pen Inject 15 Units into the skin 2 (two) times daily. Hold for CBG <  85  15 units in the am and 15 units in the pm  . ketoconazole (NIZORAL) 2 % shampoo Apply 1 application topically 2 (two) times a week.  . latanoprost (XALATAN) 0.005 % ophthalmic solution 1 drop at bedtime.  . metoprolol tartrate (LOPRESSOR) 25 MG tablet Take 75 mg by mouth 2 (two) times daily.   . Multiple Vitamins-Minerals (CENTRUM SILVER 50+MEN) TABS Take by mouth daily.  . QUEtiapine (SEROQUEL) 25 MG tablet Take 0.5 tablets (12.5 mg total) by mouth at bedtime.  . tamsulosin (FLOMAX) 0.4 MG CAPS Take 0.4 mg by mouth daily after  breakfast.   No facility-administered encounter medications on file as of 01/15/2019.     ROS  Immunization History  Administered Date(s) Administered  . Influenza Inj Mdck Quad Pf 03/17/2016  . Influenza Whole 03/13/2013  . Influenza,inj,Quad PF,6+ Mos 03/20/2018  . Influenza-Unspecified 03/04/2014, 03/12/2015, 03/23/2017  . PPD Test 07/05/2012  . Pneumococcal Conjugate-13 07/01/2016  . Pneumococcal Polysaccharide-23 07/16/1996  . Td 07/01/2016  . Zoster Recombinat (Shingrix) 06/30/2017   Pertinent  Health Maintenance Due  Topic Date Due  . FOOT EXAM  06/01/2018  . URINE MICROALBUMIN  06/02/2018  . OPHTHALMOLOGY EXAM  09/28/2018  . HEMOGLOBIN A1C  12/04/2018  . INFLUENZA VACCINE  12/29/2018  . PNA vac Low Risk Adult  Completed   Fall Risk  07/12/2018 04/28/2018 10/03/2017 09/28/2016 05/29/2015  Falls in the past year? 1 Exclusion - non ambulatory No No No  Number falls in past yr: 0 - - - -  Injury with Fall? 1 - - - -  Risk for fall due to : History of fall(s);Impaired mobility;Impaired balance/gait;Mental status change Impaired mobility - - History of fall(s)  Follow up Falls evaluation completed;Education provided;Falls prevention discussed Education provided - - -  Comment involved bathing apparatus and staff were reeducated  - - - -   Functional Status Survey:    Vitals:   01/15/19 1528  BP: 113/70  Pulse: 94  Resp: 16  Temp: 98.6 F (37 C)  TempSrc: Oral  SpO2: 94%  Weight: 180 lb (81.6 kg)  Height: 5\' 11"  (1.803 m)   Body mass index is 25.1 kg/m. Physical Exam  Labs reviewed: Recent Labs    06/05/18 0600 09/29/18  NA 142 135*  K 4.9 4.3  BUN 23* 31*  CREATININE 1.2 1.4*   No results for input(s): AST, ALT, ALKPHOS, BILITOT, PROT, ALBUMIN in the last 8760 hours. Recent Labs    09/29/18 10/01/18  WBC 13.6 10.7  NEUTROABS 10  --   HGB 10.8* 11.4*  HCT 32* 32*  PLT 225 225   Lab Results  Component Value Date   TSH 1.67 03/05/2018   Lab  Results  Component Value Date   HGBA1C 7.8 06/05/2018   Lab Results  Component Value Date   CHOL 152 06/27/2013   HDL 55 06/27/2013   LDLCALC 78 06/27/2013   TRIG 83 06/27/2013    Significant Diagnostic Results in last 30 days:  No results found.  Assessment/Plan There are no diagnoses linked to this encounter.   Family/ staff Communication: ***  Labs/tests ordered:  ***   Keiasia Christianson L. Wilmar Prabhakar, D.O. [facility] [address], [place] [address] Cell Phone (Mon-Fri 8am-5pm):  [phone] On Call:  [phone] & follow prompts after 5pm & weekends Office Phone:  [phone] Office Fax:  [phone]

## 2019-01-15 NOTE — Progress Notes (Signed)
Patient ID: George Kemp, male   DOB: 11-27-1926, 85 y.o.   MRN: [id number]  Location:  [facility] Room Number: 357 Place of Service:  SNF ([phone]) Provider:   Gayland Curry, DO  Patient Care Team: Gayland Curry, DO as PCP - General (Geriatric Medicine) [name]  Extended Emergency Contact Information Primary Emergency Contact: [facility] Address: [address]          [place], [place] [address] [name] of [place] Phone: [phone] Mobile Phone: [phone] Relation: Daughter  Code Status:  DNR Goals of care: Advanced Directive information Advanced Directives 07/03/2018  Does Patient Have a Medical Advance Directive? Yes  Type of Paramedic of [place];Out of facility DNR (pink MOST or yellow form)  Does patient want to make changes to medical advance directive? No - Patient declined  Copy of [place] in Chart? Yes - validated most recent copy scanned in chart (See row information)  Pre-existing out of facility DNR order (yellow form or pink MOST form) Yellow form placed in chart (order not valid for inpatient use)     Chief Complaint  Patient presents with  . Medical Management of Chronic Issues    Routine Visit    HPI:  Pt is a 85 y.o. male seen today for medical management of chronic diseases.  George Kemp lives in [place] for long-term care s/p stroke.  His cognition has been declining and there have been more difficulties keeping his glucose controlled especially in the afternoons so insulin was adjusted by NP.  His intake has declined.  He talks very little recently.    I had noted reports of concerns about a blistering rash of his inner thighs.  Apparently, his adult undergarments had been changed and contained plastic which he has not tolerated in other products historically.  He developed some large blisters in his medial thighs.  This had been determined to be a  contact dermatitis and those briefs were discontinued.  There was question by other staff as to whether he'd burned himself due to the appearance of the blisters, but there was no time where he was alone with hot coffee for this to occur.  The blisters have been improving since the plastic briefs were discontinued and I asked that staff clearly indicate the reaction on pt's allergies of his chart.  He has since developed a wound on his sacrum that began as a fungal rash but did not resolve as they have in the past--now an open area.    George Kemp is also needing help eating now, sleeps a lot more and does not seem to engage during visits.  He smiled and attempted to say a few words, but was not well understood.  Past Medical History:  Diagnosis Date  . Anemia, iron deficiency 12/30/2012  . Basal cell carcinoma   . Bipolar 1 disorder ([place])   . CKD (chronic kidney disease), stage II 07/09/2012   07/16/12:   stage II chronic kidney disease by GFR.  Follow lab at interval    . Dementia ([place])   . Diabetes ([place])   . GI bleed   . Hypertension   . Mixed Alzheimer's and vascular dementia ([place]) 12/01/2012   06/07/12: STML re: CVA- contribute to debility/dependence. Pt. is aware of deficit. Will benefit from [place] environment for assistance, supervision, socialization    07/16/12:  Admission MDS reviewed: BIMS score 13/15,  functional status: Pt requires limited to  extensive assist with all ADLs except eating (supervision)    . Pressure ulcer of sacrum 12/07/2012  . Squamous cell carcinoma of skin of scalp 01/24/14   anterior and posterior scalp   . Stroke [facility]) 08/2009   Left side with residual deficit   Past Surgical History:  Procedure Laterality Date  . HIP ARTHROPLASTY Left 12/01/2012   Procedure: ARTHROPLASTY BIPOLAR HIP;  Surgeon: Johnn Hai, MD;  [facility];  Service: Orthopedics;  Laterality: Left;  . SKIN LESION EXCISION  01/24/14   Anterior and posterio scalp Squamous cell [name] PA     No Known Allergies  Outpatient Encounter Medications as of 01/15/2019  Medication Sig  . acetaminophen (TYLENOL) 325 MG tablet Take 650 mg by mouth every 6 (six) hours as needed for pain.  . Amino Acids-Protein Hydrolys (PRO-STAT 101) LIQD Take 30 mLs by mouth every morning.  Marland Kitchen antiseptic oral rinse (BIOTENE) LIQD 15 mLs by Mouth Rinse route 4 (four) times daily.  Marland Kitchen aspirin 81 MG tablet Take 81 mg by mouth daily.  . cholecalciferol (VITAMIN D) 1000 UNITS tablet Take 1,000 Units by mouth every morning.  . clomiPRAMINE (ANAFRANIL) 25 MG capsule Take 1 capsule (25 mg total) by mouth at bedtime.  . donepezil (ARICEPT) 10 MG tablet Take 10 mg by mouth daily.  . Insulin Detemir (LEVEMIR FLEXTOUCH) 100 UNIT/ML Pen Inject 15 Units into the skin 2 (two) times daily. Hold for CBG <85  15 units in the am and 15 units in the pm  . ketoconazole (NIZORAL) 2 % shampoo Apply 1 application topically 2 (two) times a week.  . latanoprost (XALATAN) 0.005 % ophthalmic solution 1 drop at bedtime.  . metoprolol tartrate (LOPRESSOR) 25 MG tablet Take 75 mg by mouth 2 (two) times daily.   . Multiple Vitamins-Minerals (CENTRUM SILVER 50+MEN) TABS Take by mouth daily.  . QUEtiapine (SEROQUEL) 25 MG tablet Take 0.5 tablets (12.5 mg total) by mouth at bedtime.  . tamsulosin (FLOMAX) 0.4 MG CAPS Take 0.4 mg by mouth daily after breakfast.  . [DISCONTINUED] docusate sodium (COLACE) 100 MG capsule Take 100 mg by mouth daily.   No facility-administered encounter medications on file as of 01/15/2019.     Review of Systems  Constitutional: Positive for activity change, appetite change and fatigue. Negative for chills and fever.       Down 1.5 lbs from last visit  HENT: Positive for hearing loss and trouble swallowing.   Eyes: Negative for visual disturbance.       Glasses  Respiratory: Negative for chest tightness and shortness of breath.   Cardiovascular: Positive for leg swelling. Negative for chest pain and  palpitations.  Gastrointestinal: Negative for abdominal pain, constipation, diarrhea, nausea and vomiting.  Genitourinary: Negative for dysuria.  Musculoskeletal: Positive for gait problem.  Skin: Negative for color change.  Neurological: Negative for dizziness.       Hemiparesis  Psychiatric/Behavioral: Positive for confusion. Negative for agitation, behavioral problems and sleep disturbance. The patient is not nervous/anxious.     Immunization History  Administered Date(s) Administered  . Influenza Inj Mdck Quad Pf 03/17/2016  . Influenza Whole 03/13/2013  . Influenza,inj,Quad PF,6+ Mos 03/20/2018  . Influenza-Unspecified 03/04/2014, 03/12/2015, 03/23/2017  . PPD Test 07/05/2012  . Pneumococcal Conjugate-13 07/01/2016  . Pneumococcal Polysaccharide-23 07/16/1996  . Td 07/01/2016  . Zoster Recombinat (Shingrix) 06/30/2017   Pertinent  Health Maintenance Due  Topic Date Due  . FOOT EXAM  06/01/2018  . URINE MICROALBUMIN  06/02/2018  .  OPHTHALMOLOGY EXAM  09/28/2018  . HEMOGLOBIN A1C  12/04/2018  . INFLUENZA VACCINE  12/29/2018  . PNA vac Low Risk Adult  Completed   Fall Risk  07/12/2018 04/28/2018 10/03/2017 09/28/2016 05/29/2015  Falls in the past year? 1 Exclusion - non ambulatory No No No  Number falls in past yr: 0 - - - -  Injury with Fall? 1 - - - -  Risk for fall due to : History of fall(s);Impaired mobility;Impaired balance/gait;Mental status change Impaired mobility - - History of fall(s)  Follow up Falls evaluation completed;Education provided;Falls prevention discussed Education provided - - -  Comment involved bathing apparatus and staff were reeducated  - - - -   Functional Status Survey:    Vitals:   01/15/19 1528  BP: 113/70  Pulse: 94  Resp: 16  Temp: 98.6 F (37 C)  TempSrc: Oral  SpO2: 94%  Weight: 180 lb (81.6 kg)  Height: 5\' 11"  (1.803 m)   Body mass index is 25.1 kg/m. Physical Exam Vitals signs reviewed.  Constitutional:      General: He is  not in acute distress.    Appearance: He is not toxic-appearing.  HENT:     Head: Normocephalic and atraumatic.  Eyes:     Comments: glasses  Cardiovascular:     Rate and Rhythm: Normal rate and regular rhythm.     Pulses: Normal pulses.     Heart sounds: Normal heart sounds.  Pulmonary:     Effort: Pulmonary effort is normal.     Breath sounds: Normal breath sounds. No rhonchi or rales.  Abdominal:     General: Bowel sounds are normal. There is no distension.     Tenderness: There is no abdominal tenderness.  Musculoskeletal:     Comments: Left hemiparesis, mild nonpitting edema both lower legs/feet  Skin:    Capillary Refill: Capillary refill takes less than 2 seconds.     Comments: Nickel-sized open area on sacrum  Neurological:     Mental Status: He is alert.     Cranial Nerves: Cranial nerve deficit present.     Motor: Weakness present.     Gait: Gait abnormal.  Psychiatric:     Comments: Smiles, but speech not clear     Labs reviewed: Recent Labs    06/05/18 0600 09/29/18  NA 142 135*  K 4.9 4.3  BUN 23* 31*  CREATININE 1.2 1.4*   No results for input(s): AST, ALT, ALKPHOS, BILITOT, PROT, ALBUMIN in the last 8760 hours. Recent Labs    09/29/18 10/01/18  WBC 13.6 10.7  NEUTROABS 10  --   HGB 10.8* 11.4*  HCT 32* 32*  PLT 225 225   Lab Results  Component Value Date   TSH 1.67 03/05/2018   Lab Results  Component Value Date   HGBA1C 7.8 06/05/2018   Lab Results  Component Value Date   CHOL 152 06/27/2013   HDL 55 06/27/2013   LDLCALC 78 06/27/2013   TRIG 83 06/27/2013    Assessment/Plan 1. Mixed Alzheimer's and vascular dementia ([place]) -recently has declined considerably cognitively amid covid  2. Contact dermatitis due to plastic -slowly resolving after new undergarments were tried that caused a severe blistering breakout on his thighs  3. Type 2 diabetes mellitus with stage 3 chronic kidney disease, with long-term current use of insulin (HCC)  -PM CBGs were running high and insulin was increased to accomodate Lab Results  Component Value Date   HGBA1C 7.8 06/05/2018   4. Urinary  incontinence due to immobility -ongoing issue and he occasionally get fungal rashes related to moisture and heat -has led to his skin breakdown on his sacrum that has just occured -getting endit cream to buttock and leaving open to air when in bed  5. Left hemiparesis (HCC) -affects mobility in bed making offloading pressure more challenging for him  6. Pressure injury of sacral region, stage 2 ([place]) -new development after fungal rash did not resolve as quickly and with recent declining function, increased sleep -cont endit cream and prostat supplement  Family/ staff Communication: discussed with SNF nurse  Labs/tests ordered:  Needs hba1c abstracted, ? Eye exam done this year for retinopathy  Sotiria Keast L. Kushal Saunders, D.O. [facility] [address], [place] [address] Cell Phone (Mon-Fri 8am-5pm):  [phone] On Call:  [phone] & follow prompts after 5pm & weekends Office Phone:  [phone] Office Fax:  [phone]

## 2019-01-18 DIAGNOSIS — L89152 Pressure ulcer of sacral region, stage 2: Secondary | ICD-10-CM | POA: Insufficient documentation

## 2019-01-18 DIAGNOSIS — L258 Unspecified contact dermatitis due to other agents: Secondary | ICD-10-CM | POA: Insufficient documentation

## 2019-02-11 LAB — HM DIABETES EYE EXAM

## 2019-02-13 ENCOUNTER — Encounter: Payer: Self-pay | Admitting: *Deleted

## 2019-02-14 ENCOUNTER — Non-Acute Institutional Stay (SKILLED_NURSING_FACILITY): Payer: Medicare Other | Admitting: Adult Health

## 2019-02-14 ENCOUNTER — Encounter: Payer: Self-pay | Admitting: Adult Health

## 2019-02-14 DIAGNOSIS — I10 Essential (primary) hypertension: Secondary | ICD-10-CM | POA: Diagnosis not present

## 2019-02-14 DIAGNOSIS — L89152 Pressure ulcer of sacral region, stage 2: Secondary | ICD-10-CM | POA: Diagnosis not present

## 2019-02-14 DIAGNOSIS — G309 Alzheimer's disease, unspecified: Secondary | ICD-10-CM

## 2019-02-14 DIAGNOSIS — N400 Enlarged prostate without lower urinary tract symptoms: Secondary | ICD-10-CM

## 2019-02-14 DIAGNOSIS — N183 Chronic kidney disease, stage 3 unspecified: Secondary | ICD-10-CM

## 2019-02-14 DIAGNOSIS — F0150 Vascular dementia without behavioral disturbance: Secondary | ICD-10-CM

## 2019-02-14 DIAGNOSIS — E1122 Type 2 diabetes mellitus with diabetic chronic kidney disease: Secondary | ICD-10-CM | POA: Diagnosis not present

## 2019-02-14 DIAGNOSIS — F0280 Dementia in other diseases classified elsewhere without behavioral disturbance: Secondary | ICD-10-CM

## 2019-02-14 DIAGNOSIS — Z794 Long term (current) use of insulin: Secondary | ICD-10-CM

## 2019-02-14 LAB — HM DIABETES FOOT EXAM

## 2019-02-14 NOTE — Progress Notes (Signed)
Location:  Occupational psychologist of Service:  SNF (31) Provider:   Cindi Carbon, ANP [place] [phone]   Gayland Curry, DO  Patient Care Team: Gayland Curry, DO as PCP - General (Geriatric Medicine) Fay Records, Well [name]  Extended Emergency Contact Information Primary Emergency Contact: [facility] Address: [address]          [place], [place] [address] [name] of [place] Phone: [phone] Mobile Phone: [phone] Relation: Daughter  Code Status: DNR Goals of care: Advanced Directive information Advanced Directives 07/03/2018  Does Patient Have a Medical Advance Directive? Yes  Type of Paramedic of [place];Out of facility DNR (pink MOST or yellow form)  Does patient want to make changes to medical advance directive? No - Patient declined  Copy of [place] in Chart? Yes - validated most recent copy scanned in chart (See row information)  Pre-existing out of facility DNR order (yellow form or pink MOST form) Yellow form placed in chart (order not valid for inpatient use)     Chief Complaint  Patient presents with   Medical Management of Chronic Issues    HPI:  Pt is a 85 y.o. male seen today for medical management of chronic diseases.   He has a hx of vascular dementia and CVA with left sided weakness. The nurse reports that he is talking less and sleeping more. Last MMSE 12/30 in July of 2020 which indicates a drop from 16 6 months ago. He is talking less and his speech is more slurred. He needs assistance with feeding now and also does not participate in activities as much as he used to.  He also is weaker needs help propelling his wheelchair.   For my visit he is in bed and has no acute complaints. He has a wound to his bottom that is followed by the wound care nurse. He is taking prostat which seems to help with healing.   Blood sugars remain normal in  the am and 200-300 in the evening due to diet choices.   No issues with urination or bowel movements  BP controlled  Functional status: hoyer lift, incontinent Past Medical History:  Diagnosis Date   Anemia, iron deficiency 12/30/2012   Basal cell carcinoma    Bipolar 1 disorder (HCC)    CKD (chronic kidney disease), stage II 07/09/2012   07/16/12:   stage II chronic kidney disease by GFR.  Follow lab at interval     Dementia ([place])    Diabetes ([place])    GI bleed    Hypertension    Mixed Alzheimer's and vascular dementia ([place]) 12/01/2012   06/07/12: STML re: CVA- contribute to debility/dependence. Pt. is aware of deficit. Will benefit from [place] environment for assistance, supervision, socialization    07/16/12:  Admission MDS reviewed: BIMS score 13/15,  functional status: Pt requires limited to extensive assist with all ADLs except eating (supervision)     Pressure ulcer of sacrum 12/07/2012   Squamous cell carcinoma of skin of scalp 01/24/14   anterior and posterior scalp    Stroke ([place]) 08/2009   Left side with residual deficit   Past Surgical History:  Procedure Laterality Date   HIP ARTHROPLASTY Left 12/01/2012   Procedure: ARTHROPLASTY BIPOLAR HIP;  Surgeon: Johnn Hai, MD;  Location: [facility];  Service: Orthopedics;  Laterality: Left;   SKIN LESION EXCISION  01/24/14   Anterior and posterio scalp Squamous cell Hilda Blades  Fl[place]ne [place] PA    No Known Allergies  Outpatient Encounter Medications as of 02/14/2019  Medication Sig   acetaminophen (TYLENOL) 325 MG tablet Take 650 mg by mouth every 6 (six) hours as needed for pain.   Amino Acids-Protein Hydrolys (PRO-STAT 101) LIQD Take 30 mLs by mouth every morning.   antiseptic oral rinse (BIOTENE) LIQD 15 mLs by Mouth Rinse route 4 (four) times daily.   aspirin 81 MG tablet Take 81 mg by mouth daily.   cholecalciferol (VITAMIN D) 1000 UNITS tablet Take 1,000 Units by mouth every morning.   clomiPRAMINE (ANAFRANIL) 25  MG capsule Take 1 capsule (25 mg total) by mouth at bedtime.   donepezil (ARICEPT) 10 MG tablet Take 10 mg by mouth daily.   Insulin Detemir (LEVEMIR FLEXTOUCH) 100 UNIT/ML Pen Inject 15 Units into the skin 2 (two) times daily. Hold for CBG <85  15 units in the am and 20 units in the pm   ketoconazole (NIZORAL) 2 % shampoo Apply 1 application topically 2 (two) times a week.   latanoprost (XALATAN) 0.005 % ophthalmic solution 1 drop at bedtime.   metoprolol tartrate (LOPRESSOR) 25 MG tablet Take 75 mg by mouth 2 (two) times daily.    Multiple Vitamins-Minerals (CENTRUM SILVER 50+MEN) TABS Take by mouth daily.   QUEtiapine (SEROQUEL) 25 MG tablet Take 0.5 tablets (12.5 mg total) by mouth at bedtime.   tamsulosin (FLOMAX) 0.4 MG CAPS Take 0.4 mg by mouth daily after breakfast.   No facility-administered encounter medications on file as of 02/14/2019.     Review of Systems  Constitutional: Negative for activity change, appetite change, chills, diaphoresis, fatigue, fever and unexpected weight change.  Respiratory: Negative for cough, shortness of breath, wheezing and stridor.   Cardiovascular: Positive for leg swelling. Negative for chest pain and palpitations.  Gastrointestinal: Negative for abdominal distention, abdominal pain, constipation and diarrhea.  Genitourinary: Negative for difficulty urinating and dysuria.  Musculoskeletal: Positive for gait problem. Negative for arthralgias, back pain, joint swelling and myalgias.  Neurological: Positive for weakness. Negative for dizziness, seizures, syncope, facial asymmetry, speech difficulty and headaches.  Hematological: Negative for adenopathy. Does not bruise/bleed easily.  Psychiatric/Behavioral: Positive for confusion. Negative for agitation and behavioral problems.    Immunization History  Administered Date(s) Administered   Influenza Inj Mdck Quad Pf 03/17/2016   Influenza Whole 03/13/2013   Influenza,inj,Quad PF,6+ Mos  03/20/2018   Influenza-Unspecified 03/04/2014, 03/12/2015, 03/23/2017   PPD Test 07/05/2012   Pneumococcal Conjugate-13 07/01/2016   Pneumococcal Polysaccharide-23 07/16/1996   Td 07/01/2016   Zoster Recombinat (Shingrix) 06/30/2017   Pertinent  Health Maintenance Due  Topic Date Due   INFLUENZA VACCINE  12/29/2018   HEMOGLOBIN A1C  06/12/2019   FOOT EXAM  01/15/2020   OPHTHALMOLOGY EXAM  02/11/2020   PNA vac Low Risk Adult  Completed   Fall Risk  07/12/2018 04/28/2018 10/03/2017 09/28/2016 05/29/2015  Falls in the past year? 1 Exclusion - non ambulatory No No No  Number falls in past yr: 0 - - - -  Injury with Fall? 1 - - - -  Risk for fall due to : History of fall(s);Impaired mobility;Impaired balance/gait;Mental status change Impaired mobility - - History of fall(s)  Follow up Falls evaluation completed;Education provided;Falls prevention discussed Education provided - - -  Comment involved bathing apparatus and staff were reeducated  - - - -   Functional Status Survey:    Vitals:   02/14/19 1651  Weight: 176 lb 1.6 oz (79.9 kg)  Body mass index is 24.56 kg/m. Physical Exam Vitals signs and nursing note reviewed.  Constitutional:      General: He is not in acute distress.    Appearance: He is not diaphoretic.  HENT:     Head: Normocephalic and atraumatic.  Neck:     Thyroid: No thyromegaly.     Vascular: No JVD.     Trachea: No tracheal deviation.  Cardiovascular:     Rate and Rhythm: Normal rate and regular rhythm.     Heart sounds: No murmur.  Pulmonary:     Effort: Pulmonary effort is normal. No respiratory distress.     Breath sounds: Normal breath sounds. No wheezing.  Abdominal:     General: Bowel sounds are normal. There is no distension.     Palpations: Abdomen is soft.     Tenderness: There is no abdominal tenderness.  Musculoskeletal:     Left lower leg: Edema present.  Lymphadenopathy:     Cervical: No cervical adenopathy.  Skin:     General: Skin is warm and dry.  Neurological:     Mental Status: He is alert.     Comments: Left sided weakness. Oriented to self. Slurred speech  Psychiatric:        Mood and Affect: Mood normal.     Labs reviewed: Recent Labs    06/05/18 0600 09/29/18  NA 142 135*  K 4.9 4.3  BUN 23* 31*  CREATININE 1.2 1.4*   No results for input(s): AST, ALT, ALKPHOS, BILITOT, PROT, ALBUMIN in the last 8760 hours. Recent Labs    09/29/18 10/01/18  WBC 13.6 10.7  NEUTROABS 10  --   HGB 10.8* 11.4*  HCT 32* 32*  PLT 225 225   Lab Results  Component Value Date   TSH 1.67 03/05/2018   Lab Results  Component Value Date   HGBA1C 7.7 12/10/2018   Lab Results  Component Value Date   CHOL 152 06/27/2013   HDL 55 06/27/2013   LDLCALC 78 06/27/2013   TRIG 83 06/27/2013    Significant Diagnostic Results in last 30 days:  No results found.  Assessment/Plan  1. Mixed Alzheimer's and vascular dementia ([place]) Progressively more confused with reduced speech and dysarthria. Goals of care are comfort based, no further intervention is warranted at this time. Given his progression we may need to consider discontinuing the Aricept in the near future.   2. Essential hypertension Controlled, continue metoprolol 75 mg bid  3. Type 2 diabetes mellitus with stage 3 chronic kidney disease, with long-term current use of insulin (HCC) Lab Results  Component Value Date   HGBA1C 7.7 12/10/2018  Goal <8, continue Levemir 15 units bid   4. Pressure injury of sacral region, stage 2 ([place]) Improving, followed by wound care nurse Continue prostat to support wound healing.   5. Benign prostatic hyperplasia without lower urinary tract symptoms Continue flomax 0.4 mg qhs  6. CKD (chronic kidney disease) stage 3, GFR 30-59 ml/min (HCC) Continue to periodically monitor BMP and avoid nephrotoxic agents   Family/ staff Communication: staff  Labs/tests ordered: NA

## 2019-02-27 LAB — NOVEL CORONAVIRUS, NAA: SARS-CoV-2, NAA: NOT DETECTED

## 2019-03-08 ENCOUNTER — Other Ambulatory Visit: Payer: Self-pay | Admitting: *Deleted

## 2019-03-15 ENCOUNTER — Non-Acute Institutional Stay (SKILLED_NURSING_FACILITY): Payer: Medicare Other | Admitting: Adult Health

## 2019-03-15 ENCOUNTER — Encounter: Payer: Self-pay | Admitting: Adult Health

## 2019-03-15 DIAGNOSIS — F0150 Vascular dementia without behavioral disturbance: Secondary | ICD-10-CM

## 2019-03-15 DIAGNOSIS — I10 Essential (primary) hypertension: Secondary | ICD-10-CM

## 2019-03-15 DIAGNOSIS — E1121 Type 2 diabetes mellitus with diabetic nephropathy: Secondary | ICD-10-CM

## 2019-03-15 DIAGNOSIS — F0280 Dementia in other diseases classified elsewhere without behavioral disturbance: Secondary | ICD-10-CM

## 2019-03-15 DIAGNOSIS — G309 Alzheimer's disease, unspecified: Secondary | ICD-10-CM | POA: Diagnosis not present

## 2019-03-15 DIAGNOSIS — Z794 Long term (current) use of insulin: Secondary | ICD-10-CM

## 2019-03-15 DIAGNOSIS — E1122 Type 2 diabetes mellitus with diabetic chronic kidney disease: Secondary | ICD-10-CM

## 2019-03-15 DIAGNOSIS — R1310 Dysphagia, unspecified: Secondary | ICD-10-CM | POA: Diagnosis not present

## 2019-03-15 DIAGNOSIS — N1832 Chronic kidney disease, stage 3b: Secondary | ICD-10-CM

## 2019-03-15 DIAGNOSIS — N400 Enlarged prostate without lower urinary tract symptoms: Secondary | ICD-10-CM

## 2019-03-15 NOTE — Progress Notes (Signed)
Location:  Occupational psychologist of Service:  SNF (31) Provider:   Cindi Carbon, ANP [place] [phone]   Gayland Curry, DO  Patient Care Team: Gayland Curry, DO as PCP - General (Geriatric Medicine) Fay Records, Well [name]  Extended Emergency Contact Information Primary Emergency Contact: [facility] Address: [address]          [place], [place] [address] [name] of [place] Phone: [phone] Mobile Phone: [phone] Relation: Daughter  Code Status:  DNR Goals of care: Advanced Directive information Advanced Directives 07/03/2018  Does Patient Have a Medical Advance Directive? Yes  Type of Paramedic of [place];Out of facility DNR (pink MOST or yellow form)  Does patient want to make changes to medical advance directive? No - Patient declined  Copy of [place] in Chart? Yes - validated most recent copy scanned in chart (See row information)  Pre-existing out of facility DNR order (yellow form or pink MOST form) Yellow form placed in chart (order not valid for inpatient use)     Chief Complaint  Patient presents with   Medical Management of Chronic Issues    HPI:  Pt is a 85 y.o. male seen today for medical management of chronic diseases.    He has a hx of CVA with left sided weakness. He is progressively more confused and requires redirection and cuing. No issues with behaviors.  He is tolerating a D2 diet with ground meats well without cough or choking per the nsg staff.   Dm II: recently evaluated by ophthalmology with no retinopathy noted. CBGs  Am: 95-150, evening 200-250.    No issues with dysuria, frequency or urgency. Currently on flomax and incontinent.   Continues to self propel in his WC but requires more help.  Goes to activities but has difficulty participating.     Past Medical History:  Diagnosis Date   Anemia, iron deficiency  12/30/2012   Basal cell carcinoma    Bipolar 1 disorder (HCC)    CKD (chronic kidney disease), stage II 07/09/2012   07/16/12:   stage II chronic kidney disease by GFR.  Follow lab at interval     Dementia ([place])    Diabetes ([place])    GI bleed    Hypertension    Mixed Alzheimer's and vascular dementia ([place]) 12/01/2012   06/07/12: STML re: CVA- contribute to debility/dependence. Pt. is aware of deficit. Will benefit from [place] environment for assistance, supervision, socialization    07/16/12:  Admission MDS reviewed: BIMS score 13/15,  functional status: Pt requires limited to extensive assist with all ADLs except eating (supervision)     Pressure ulcer of sacrum 12/07/2012   Squamous cell carcinoma of skin of scalp 01/24/14   anterior and posterior scalp    Stroke ([place]) 08/2009   Left side with residual deficit   Past Surgical History:  Procedure Laterality Date   HIP ARTHROPLASTY Left 12/01/2012   Procedure: ARTHROPLASTY BIPOLAR HIP;  Surgeon: Johnn Hai, MD;  Location: [facility];  Service: Orthopedics;  Laterality: Left;   SKIN LESION EXCISION  01/24/14   Anterior and posterio scalp Squamous cell [name] PA    No Known Allergies  Outpatient Encounter Medications as of 03/15/2019  Medication Sig   aspirin 81 MG tablet Take 81 mg by mouth daily.   cholecalciferol (VITAMIN D) 1000 UNITS tablet Take 1,000 Units by mouth every morning.   clomiPRAMINE (ANAFRANIL) 25 MG  capsule Take 1 capsule (25 mg total) by mouth at bedtime.   donepezil (ARICEPT) 10 MG tablet Take 10 mg by mouth daily.   Insulin Detemir (LEVEMIR FLEXTOUCH) 100 UNIT/ML Pen Inject 15 Units into the skin 2 (two) times daily. Hold for CBG <85  15 units in the am and 20 units in the pm   ketoconazole (NIZORAL) 2 % shampoo Apply 1 application topically 2 (two) times a week.   latanoprost (XALATAN) 0.005 % ophthalmic solution 1 drop at bedtime.   metoprolol tartrate (LOPRESSOR) 25 MG tablet Take 75 mg  by mouth 2 (two) times daily.    Multiple Vitamins-Minerals (CENTRUM SILVER 50+MEN) TABS Take by mouth daily.   QUEtiapine (SEROQUEL) 25 MG tablet Take 0.5 tablets (12.5 mg total) by mouth at bedtime.   tamsulosin (FLOMAX) 0.4 MG CAPS Take 0.4 mg by mouth daily after breakfast.   acetaminophen (TYLENOL) 325 MG tablet Take 650 mg by mouth every 6 (six) hours as needed for pain.   Amino Acids-Protein Hydrolys (PRO-STAT 101) LIQD Take 30 mLs by mouth every morning.   antiseptic oral rinse (BIOTENE) LIQD 15 mLs by Mouth Rinse route 4 (four) times daily.   No facility-administered encounter medications on file as of 03/15/2019.     Review of Systems  Constitutional: Negative for activity change, appetite change, chills, diaphoresis, fatigue, fever and unexpected weight change.  Respiratory: Negative for cough, shortness of breath, wheezing and stridor.   Cardiovascular: Negative for chest pain, palpitations and leg swelling.  Gastrointestinal: Negative for abdominal distention, abdominal pain, constipation and diarrhea.  Genitourinary: Negative for difficulty urinating and dysuria.  Musculoskeletal: Positive for gait problem. Negative for arthralgias, back pain, joint swelling and myalgias.  Skin: Positive for wound.  Neurological: Positive for speech difficulty and weakness. Negative for dizziness, seizures, syncope, facial asymmetry and headaches.  Hematological: Negative for adenopathy. Does not bruise/bleed easily.  Psychiatric/Behavioral: Positive for confusion. Negative for agitation and behavioral problems.    Immunization History  Administered Date(s) Administered   Influenza Inj Mdck Quad Pf 03/17/2016   Influenza Whole 03/13/2013   Influenza,inj,Quad PF,6+ Mos 03/20/2018   Influenza-Unspecified 03/04/2014, 03/12/2015, 03/23/2017   PPD Test 07/05/2012   Pneumococcal Conjugate-13 07/01/2016   Pneumococcal Polysaccharide-23 07/16/1996   Td 07/01/2016   Zoster  Recombinat (Shingrix) 06/30/2017   Pertinent  Health Maintenance Due  Topic Date Due   INFLUENZA VACCINE  12/29/2018   HEMOGLOBIN A1C  06/12/2019   OPHTHALMOLOGY EXAM  02/11/2020   FOOT EXAM  02/14/2020   PNA vac Low Risk Adult  Completed   Fall Risk  07/12/2018 04/28/2018 10/03/2017 09/28/2016 05/29/2015  Falls in the past year? 1 Exclusion - non ambulatory No No No  Number falls in past yr: 0 - - - -  Injury with Fall? 1 - - - -  Risk for fall due to : History of fall(s);Impaired mobility;Impaired balance/gait;Mental status change Impaired mobility - - History of fall(s)  Follow up Falls evaluation completed;Education provided;Falls prevention discussed Education provided - - -  Comment involved bathing apparatus and staff were reeducated  - - - -   Functional Status Survey:    There were no vitals filed for this visit. There is no height or weight on file to calculate BMI. Physical Exam Vitals signs and nursing note reviewed.  Constitutional:      General: He is not in acute distress.    Appearance: He is not diaphoretic.  HENT:     Head: Normocephalic and atraumatic.  Nose: Nose normal. No congestion.     Mouth/Throat:     Mouth: Mucous membranes are moist.     Pharynx: Oropharynx is clear. No oropharyngeal exudate.  Eyes:     Conjunctiva/sclera: Conjunctivae normal.     Pupils: Pupils are equal, round, and reactive to light.  Neck:     Musculoskeletal: Neck supple.     Thyroid: No thyromegaly.     Vascular: No JVD.     Trachea: No tracheal deviation.  Cardiovascular:     Rate and Rhythm: Normal rate and regular rhythm.     Heart sounds: No murmur.  Pulmonary:     Effort: Pulmonary effort is normal. No respiratory distress.     Breath sounds: Normal breath sounds. No wheezing.  Abdominal:     General: Bowel sounds are normal. There is no distension.     Palpations: Abdomen is soft.     Tenderness: There is no abdominal tenderness.  Lymphadenopathy:      Cervical: No cervical adenopathy.  Skin:    General: Skin is warm and dry.     Findings: No erythema.  Neurological:     Mental Status: He is alert. Mental status is at baseline.     Comments: Left sided weakness, slurred speech chronic  Psychiatric:        Mood and Affect: Mood normal.     Labs reviewed: Recent Labs    06/05/18 0600 09/29/18  NA 142 135*  K 4.9 4.3  BUN 23* 31*  CREATININE 1.2 1.4*   No results for input(s): AST, ALT, ALKPHOS, BILITOT, PROT, ALBUMIN in the last 8760 hours. Recent Labs    09/29/18 10/01/18  WBC 13.6 10.7  NEUTROABS 10  --   HGB 10.8* 11.4*  HCT 32* 32*  PLT 225 225   Lab Results  Component Value Date   TSH 1.67 03/05/2018   Lab Results  Component Value Date   HGBA1C 7.7 12/10/2018   Lab Results  Component Value Date   CHOL 152 06/27/2013   HDL 55 06/27/2013   LDLCALC 78 06/27/2013   TRIG 83 06/27/2013    Significant Diagnostic Results in last 30 days:  No results found.  Assessment/Plan  1. Mixed Alzheimer's and vascular dementia ([place]) Progressive decline in cognition and physical function c/w the disease. Continue supportive care in the skilled environment. Continue aspirin due to prior hx of TIA  2. Essential hypertension Controlled, continue metoprolol 75 mg bid  3. Dysphagia, unspecified type Continue D2 diet with ground meat  Asp prec  4. Type 2 diabetes mellitus with stage 3b chronic kidney disease, with long-term current use of insulin (HCC) Monitor A1C q 6 months.  Continue Levemir 15 units in the am and 20 units in the pm  5. Stage 3b chronic kidney disease Continue to periodically monitor BMP and avoid nephrotoxic agents  6. Benign prostatic hyperplasia without lower urinary tract symptoms Continue Flomax 0.4 mg qhs   Family/ staff Communication: Sent email to family regarding filling out a most form, to be complete next week.   Labs/tests ordered:  Labs due next month

## 2019-04-09 ENCOUNTER — Encounter: Payer: Self-pay | Admitting: Internal Medicine

## 2019-04-09 ENCOUNTER — Non-Acute Institutional Stay (SKILLED_NURSING_FACILITY): Payer: Medicare Other | Admitting: Internal Medicine

## 2019-04-09 DIAGNOSIS — L89152 Pressure ulcer of sacral region, stage 2: Secondary | ICD-10-CM | POA: Diagnosis not present

## 2019-04-09 DIAGNOSIS — N1832 Chronic kidney disease, stage 3b: Secondary | ICD-10-CM

## 2019-04-09 DIAGNOSIS — E1121 Type 2 diabetes mellitus with diabetic nephropathy: Secondary | ICD-10-CM | POA: Diagnosis not present

## 2019-04-09 DIAGNOSIS — F3176 Bipolar disorder, in full remission, most recent episode depressed: Secondary | ICD-10-CM

## 2019-04-09 DIAGNOSIS — Z794 Long term (current) use of insulin: Secondary | ICD-10-CM

## 2019-04-09 DIAGNOSIS — K5901 Slow transit constipation: Secondary | ICD-10-CM

## 2019-04-09 DIAGNOSIS — I69322 Dysarthria following cerebral infarction: Secondary | ICD-10-CM

## 2019-04-09 DIAGNOSIS — G309 Alzheimer's disease, unspecified: Secondary | ICD-10-CM

## 2019-04-09 DIAGNOSIS — R29818 Other symptoms and signs involving the nervous system: Secondary | ICD-10-CM

## 2019-04-09 DIAGNOSIS — F0150 Vascular dementia without behavioral disturbance: Secondary | ICD-10-CM

## 2019-04-09 DIAGNOSIS — F0280 Dementia in other diseases classified elsewhere without behavioral disturbance: Secondary | ICD-10-CM

## 2019-04-09 NOTE — Progress Notes (Signed)
Patient ID: George Kemp, male   DOB: [birth year], 85 y.o.   MRN: [id number]   Location:  [facility] Room Number: 143-A Place of Service:  SNF [phone]) Provider:  Gayland Curry, DO  Patient Care Team: Gayland Curry, DO as PCP - General (Geriatric Medicine) Community, [name]  Extended Emergency Contact Information Primary Emergency Contact: [facility] Address: [address]          [place], [place] [address] [name] of [place] Phone: [phone] Mobile Phone: [phone] Relation: Daughter  Code Status: DNR  Goals of care: Advanced Directive information Advanced Directives 04/09/2019  Does Patient Have a Medical Advance Directive? Yes  Type of Paramedic of [place];Living will;Out of facility DNR (pink MOST or yellow form)  Does patient want to make changes to medical advance directive? No - Patient declined  Copy of [place] in Chart? Yes - validated most recent copy scanned in chart (See row information)  Pre-existing out of facility DNR order (yellow form or pink MOST form) Pink MOST form placed in chart (order not valid for inpatient use);Yellow form placed in chart (order not valid for inpatient use)     Chief Complaint  Patient presents with  . Medical Management of Chronic Issues    Routine Visit     HPI:  Pt is a 85 y.o. male seen today for medical management of chronic diseases.    George Kemp was resting in bed when seen today.  He did try to speak to me but his speech has really become garbled now.  His nurse is noticing that he has a head bob that we are attributing to his long-term antipsychotic, seroquel, for his bipolar disorder.  His daughter has been quite upset when we've tried to stop it in the past when he was much more alert and interactive.  He also remains on his anafranil at [facility].    His sugars have been pretty well controlled considering the sweets  he eats.  He remains on levemir insulin 15 units in the am and 20 units in the pm with holds if cbg <85.  CBGs fasting are remaining under 751 which is certainly satisfactory.  He remains on aricept for his dementia.  He is dependent in adls.    Recently, a MOST form was completed indicating DNR which had been determined long ago, but also comfort measures--do not hospitalize unless comfort needs cannot be met here at [facility], determine or limit abx when infection occurs, trial of fluids and no feeding tube.  These selections made sense and I did support this when I spoke with the resident's son-in-law.    He'd had a pressure area from the AFO brace for his foot drop on his left heel, but it is better since that was removed and his shoes were removed in exchange for slippers due to no longer transferring (hoyer used).  The wound on his bottom has also healed.  Any signs of redness and endit is used.    Past Medical History:  Diagnosis Date  . Anemia, iron deficiency 12/30/2012  . Basal cell carcinoma   . Bipolar 1 disorder ([place])   . CKD (chronic kidney disease), stage II 07/09/2012   07/16/12:   stage II chronic kidney disease by GFR.  Follow lab at interval    . Dementia ([place])   . Diabetes ([place])   . GI bleed   . Hypertension   . Mixed  Alzheimer's and vascular dementia ([place]) 12/01/2012   06/07/12: STML re: CVA- contribute to debility/dependence. Pt. is aware of deficit. Will benefit from [place] environment for assistance, supervision, socialization    07/16/12:  Admission MDS reviewed: BIMS score 13/15,  functional status: Pt requires limited to extensive assist with all ADLs except eating (supervision)    . Pressure ulcer of sacrum 12/07/2012  . Squamous cell carcinoma of skin of scalp 01/24/14   anterior and posterior scalp   . Stroke [facility]) 08/2009   Left side with residual deficit   Past Surgical History:  Procedure Laterality Date  . HIP ARTHROPLASTY Left 12/01/2012   Procedure:  ARTHROPLASTY BIPOLAR HIP;  Surgeon: Johnn Hai, MD;  Location: [facility];  Service: Orthopedics;  Laterality: Left;  . SKIN LESION EXCISION  01/24/14   Anterior and posterio scalp Squamous cell [name] PA    No Known Allergies  Outpatient Encounter Medications as of 04/09/2019  Medication Sig  . acetaminophen (TYLENOL) 325 MG tablet Take 650 mg by mouth every 6 (six) hours as needed for pain.  . Amino Acids-Protein Hydrolys (PRO-STAT 101) LIQD Take 30 mLs by mouth every morning.  Marland Kitchen antiseptic oral rinse (BIOTENE) LIQD 15 mLs by Mouth Rinse route 4 (four) times daily.  Marland Kitchen aspirin 81 MG tablet Take 81 mg by mouth daily.  . cholecalciferol (VITAMIN D) 1000 UNITS tablet Take 1,000 Units by mouth every morning.  . clomiPRAMINE (ANAFRANIL) 25 MG capsule Take 1 capsule (25 mg total) by mouth at bedtime.  . donepezil (ARICEPT) 10 MG tablet Take 10 mg by mouth daily.  . Insulin Detemir (LEVEMIR FLEXTOUCH) 100 UNIT/ML Pen Hold for CBG <85  15 units in the am and 20 units in the pm  . ketoconazole (NIZORAL) 2 % shampoo Apply 1 application topically 2 (two) times a week.  . latanoprost (XALATAN) 0.005 % ophthalmic solution 1 drop at bedtime.  . metoprolol tartrate (LOPRESSOR) 25 MG tablet Take 75 mg by mouth 2 (two) times daily. Hold for heart rate less than 60  . Multiple Vitamins-Minerals (CENTRUM SILVER 50+MEN) TABS Take by mouth daily.  . QUEtiapine (SEROQUEL) 25 MG tablet Take 0.5 tablets (12.5 mg total) by mouth at bedtime.  . tamsulosin (FLOMAX) 0.4 MG CAPS Take 0.4 mg by mouth daily after breakfast.   No facility-administered encounter medications on file as of 04/09/2019.     Review of Systems  Constitutional: Positive for activity change and unexpected weight change. Negative for appetite change, chills and fever.       Down two more lbs since sept  HENT: Positive for trouble swallowing and voice change. Negative for congestion.   Eyes: Negative for visual disturbance.    Respiratory: Negative for chest tightness and shortness of breath.   Cardiovascular: Negative for chest pain, palpitations and leg swelling.  Gastrointestinal: Negative for abdominal pain and constipation.  Genitourinary: Negative for dysuria.  Musculoskeletal: Positive for gait problem.  Skin: Negative for color change.  Neurological: Positive for speech difficulty and weakness.  Psychiatric/Behavioral: Positive for confusion. Negative for agitation, behavioral problems and sleep disturbance.    Immunization History  Administered Date(s) Administered  . Influenza Inj Mdck Quad Pf 03/17/2016  . Influenza Whole 03/13/2013  . Influenza, High Dose Seasonal PF 03/14/2019  . Influenza,inj,Quad PF,6+ Mos 03/20/2018  . Influenza-Unspecified 03/04/2014, 03/12/2015, 03/23/2017  . PPD Test 07/05/2012  . Pneumococcal Conjugate-13 07/01/2016  . Pneumococcal Polysaccharide-23 07/16/1996  . Td 07/01/2016  . Zoster Recombinat (Shingrix) 06/30/2017  Pertinent  Health Maintenance Due  Topic Date Due  . HEMOGLOBIN A1C  06/12/2019  . OPHTHALMOLOGY EXAM  02/11/2020  . FOOT EXAM  02/14/2020  . INFLUENZA VACCINE  Completed  . PNA vac Low Risk Adult  Completed   Fall Risk  07/12/2018 04/28/2018 10/03/2017 09/28/2016 05/29/2015  Falls in the past year? 1 Exclusion - non ambulatory No No No  Number falls in past yr: 0 - - - -  Injury with Fall? 1 - - - -  Risk for fall due to : History of fall(s);Impaired mobility;Impaired balance/gait;Mental status change Impaired mobility - - History of fall(s)  Follow up Falls evaluation completed;Education provided;Falls prevention discussed Education provided - - -  Comment involved bathing apparatus and staff were reeducated  - - - -   Functional Status Survey:    Vitals:   04/09/19 1428  BP: 125/72  Pulse: 98  Resp: (!) 21  Temp: (!) 97.3 F (36.3 C)  SpO2: 96%  Weight: 174 lb (78.9 kg)  Height: '5\' 11"'$  (1.803 m)   Body mass index is 24.27  kg/m. Physical Exam Vitals signs reviewed.  Constitutional:      General: He is not in acute distress.    Appearance: He is not toxic-appearing.  HENT:     Head: Normocephalic and atraumatic.  Eyes:     Pupils: Pupils are equal, round, and reactive to light.  Pulmonary:     Effort: Pulmonary effort is normal.     Breath sounds: Normal breath sounds. No wheezing, rhonchi or rales.  Abdominal:     General: Bowel sounds are normal. There is no distension.     Palpations: Abdomen is soft.     Tenderness: There is no abdominal tenderness.  Musculoskeletal:     Right lower leg: No edema.     Left lower leg: No edema.     Comments: Left hemiparesis  Skin:    General: Skin is warm and dry.     Capillary Refill: Capillary refill takes less than 2 seconds.  Neurological:     Mental Status: He is alert. He is disoriented.     Cranial Nerves: Cranial nerve deficit present.     Motor: Weakness present.     Comments: Severe dysarthria now; head bobbing  Psychiatric:        Mood and Affect: Mood normal.     Labs reviewed: Recent Labs    06/05/18 0600 09/29/18  NA 142 135*  K 4.9 4.3  BUN 23* 31*  CREATININE 1.2 1.4*   No results for input(s): AST, ALT, ALKPHOS, BILITOT, PROT, ALBUMIN in the last 8760 hours. Recent Labs    09/29/18 10/01/18  WBC 13.6 10.7  NEUTROABS 10  --   HGB 10.8* 11.4*  HCT 32* 32*  PLT 225 225   Lab Results  Component Value Date   TSH 1.67 03/05/2018   Lab Results  Component Value Date   HGBA1C 7.7 12/10/2018   Lab Results  Component Value Date   CHOL 152 06/27/2013   HDL 55 06/27/2013   LDLCALC 78 06/27/2013   TRIG 83 06/27/2013    Assessment/Plan 1. Mixed Alzheimer's and vascular dementia ([place]) -advancing, unfortunately -he is now requiring hoyer lift, more assistance with meals and he struggles to communicate clearly with dysarthria  2. 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 Type 2 diabetes mellitus with stage 3b chronic kidney disease,  with long-term current use of insulin (HCC) -cont levemir regimen--control is good and goal hba1c is less than 8.5 at this point  4. Pressure injury of sacral region, stage 2 ([place]) -resolved  5. Bipolar 1 disorder, depressed, full remission ([place]) -continues on anafranil and seroquel longstanding--now with EPS symptom  6. Slow transit constipation -cont current bowel regimen and monitor  7.  EPS -he is now having head bob most of the time--attributable to seroquel--recommendation to reduce or stop has been previously declined by family when for purposes of gradual dose reduction, but now he has a true side effect that might warrant reduction   Family/ staff Communication: discussed with snf nurse  Labs/tests ordered:  No new  Waino Mounsey L. Dylynn Ketner, D.O. [facility] [address], [place] [address] Cell Phone (Mon-Fri 8am-5pm):  [phone] On Call:  [phone] & follow prompts after 5pm & weekends Office Phone:  [phone] Office Fax:  [phone]

## 2019-04-12 DIAGNOSIS — I69322 Dysarthria following cerebral infarction: Secondary | ICD-10-CM | POA: Insufficient documentation

## 2019-04-12 DIAGNOSIS — R29818 Other symptoms and signs involving the nervous system: Secondary | ICD-10-CM | POA: Insufficient documentation

## 2019-05-10 ENCOUNTER — Encounter: Payer: Self-pay | Admitting: Adult Health

## 2019-05-10 ENCOUNTER — Non-Acute Institutional Stay (SKILLED_NURSING_FACILITY): Payer: Medicare Other | Admitting: Adult Health

## 2019-05-10 DIAGNOSIS — G8194 Hemiplegia, unspecified affecting left nondominant side: Secondary | ICD-10-CM

## 2019-05-10 DIAGNOSIS — R29818 Other symptoms and signs involving the nervous system: Secondary | ICD-10-CM

## 2019-05-10 DIAGNOSIS — N1832 Chronic kidney disease, stage 3b: Secondary | ICD-10-CM | POA: Diagnosis not present

## 2019-05-10 DIAGNOSIS — E1121 Type 2 diabetes mellitus with diabetic nephropathy: Secondary | ICD-10-CM | POA: Diagnosis not present

## 2019-05-10 DIAGNOSIS — N400 Enlarged prostate without lower urinary tract symptoms: Secondary | ICD-10-CM | POA: Diagnosis not present

## 2019-05-10 DIAGNOSIS — R1312 Dysphagia, oropharyngeal phase: Secondary | ICD-10-CM

## 2019-05-10 DIAGNOSIS — I69322 Dysarthria following cerebral infarction: Secondary | ICD-10-CM

## 2019-05-10 DIAGNOSIS — Z794 Long term (current) use of insulin: Secondary | ICD-10-CM

## 2019-05-10 NOTE — Progress Notes (Signed)
Location:  Occupational psychologist of Service:  SNF (31) Provider:   Cindi Carbon, ANP [place] [phone]  Gayland Curry, DO  Patient Care Team: Gayland Curry, DO as PCP - General (Geriatric Medicine) [facility], [name]  Extended Emergency Contact Information Primary Emergency Contact: [facility] Address: [address]          [place], [place] [address] [name] of [place] Phone: [phone] Mobile Phone: [phone] Relation: Daughter  Code Status:  DNR Goals of care: Advanced Directive information Advanced Directives 04/09/2019  Does Patient Have a Medical Advance Directive? Yes  Type of Paramedic of [place];Living will;Out of facility DNR (pink MOST or yellow form)  Does patient want to make changes to medical advance directive? No - Patient declined  Copy of [place] in Chart? Yes - validated most recent copy scanned in chart (See row information)  Pre-existing out of facility DNR order (yellow form or pink MOST form) Pink MOST form placed in chart (order not valid for inpatient use);Yellow form placed in chart (order not valid for inpatient use)     Chief Complaint  Patient presents with  . Medical Management of Chronic Issues    HPI:  Pt is a 85 y.o. male seen today for medical management of chronic diseases.  He has a hx of CVA with left sided weakness. Lately his dysarthria has been worse. He is more confused over time as well. He denies any pain or discomfort for my visit. He is non ambulatory and uses a wheelchair to self propel. He continues to participate in activities but is not has proficient. He has a head bob that has been present for quite some time, thought to be due to long term seroquel use. In the past he has failed trials off. His blood sugars have been 150-200 in the am and 250-300 in the evening. He likes to eat sweets. No diabetic ulcers  or issues with low sugars reported. He has a hx of some dysphagia and is currently on a D2 diet with ground meat. He is tolerating this well but has difficult clearing his mouth of food afterwards.    Past Medical History:  Diagnosis Date  . Anemia, iron deficiency 12/30/2012  . Basal cell carcinoma   . Bipolar 1 disorder ([place])   . CKD (chronic kidney disease), stage II 07/09/2012   07/16/12:   stage II chronic kidney disease by GFR.  Follow lab at interval    . Dementia ([place])   . Diabetes ([place])   . GI bleed   . Hypertension   . Mixed Alzheimer's and vascular dementia ([place]) 12/01/2012   06/07/12: STML re: CVA- contribute to debility/dependence. Pt. is aware of deficit. Will benefit from [place] environment for assistance, supervision, socialization    07/16/12:  Admission MDS reviewed: BIMS score 13/15,  functional status: Pt requires limited to extensive assist with all ADLs except eating (supervision)    . Pressure ulcer of sacrum 12/07/2012  . Squamous cell carcinoma of skin of scalp 01/24/14   anterior and posterior scalp   . Stroke [facility]) 08/2009   Left side with residual deficit   Past Surgical History:  Procedure Laterality Date  . HIP ARTHROPLASTY Left 12/01/2012   Procedure: ARTHROPLASTY BIPOLAR HIP;  Surgeon: Johnn Hai, MD;  Location: [facility];  Service: Orthopedics;  Laterality: Left;  . SKIN LESION EXCISION  01/24/14   Anterior and posterio  scalp Squamous cell [name] PA    No Known Allergies  Outpatient Encounter Medications as of 05/10/2019  Medication Sig  . acetaminophen (TYLENOL) 325 MG tablet Take 650 mg by mouth every 6 (six) hours as needed for pain.  Marland Kitchen antiseptic oral rinse (BIOTENE) LIQD 15 mLs by Mouth Rinse route 4 (four) times daily.  Marland Kitchen aspirin 81 MG tablet Take 81 mg by mouth daily.  . cholecalciferol (VITAMIN D) 1000 UNITS tablet Take 1,000 Units by mouth every morning.  . clomiPRAMINE (ANAFRANIL) 25 MG capsule Take 1 capsule (25 mg total) by mouth at  bedtime.  . donepezil (ARICEPT) 10 MG tablet Take 10 mg by mouth daily.  . Insulin Detemir (LEVEMIR FLEXTOUCH) 100 UNIT/ML Pen Hold for CBG <85  15 units in the am and 20 units in the pm  . ketoconazole (NIZORAL) 2 % shampoo Apply 1 application topically 2 (two) times a week.  . latanoprost (XALATAN) 0.005 % ophthalmic solution 1 drop at bedtime.  . metoprolol tartrate (LOPRESSOR) 25 MG tablet Take 75 mg by mouth 2 (two) times daily. Hold for heart rate less than 60  . Multiple Vitamins-Minerals (CENTRUM SILVER 50+MEN) TABS Take by mouth daily.  . QUEtiapine (SEROQUEL) 25 MG tablet Take 0.5 tablets (12.5 mg total) by mouth at bedtime.  . tamsulosin (FLOMAX) 0.4 MG CAPS Take 0.4 mg by mouth daily after breakfast.  . [DISCONTINUED] Amino Acids-Protein Hydrolys (PRO-STAT 101) LIQD Take 30 mLs by mouth every morning.   No facility-administered encounter medications on file as of 05/10/2019.    Review of Systems  Constitutional: Negative for activity change, appetite change, chills, diaphoresis, fatigue, fever and unexpected weight change.  Respiratory: Negative for cough, shortness of breath, wheezing and stridor.   Cardiovascular: Positive for leg swelling. Negative for chest pain and palpitations.  Gastrointestinal: Negative for abdominal distention, abdominal pain, constipation and diarrhea.  Genitourinary: Negative for decreased urine volume, difficulty urinating, dysuria, flank pain, frequency and urgency.  Musculoskeletal: Positive for gait problem. Negative for arthralgias, back pain, joint swelling and myalgias.  Neurological: Positive for facial asymmetry and weakness. Negative for dizziness, seizures, syncope, speech difficulty and headaches.  Hematological: Negative for adenopathy. Does not bruise/bleed easily.  Psychiatric/Behavioral: Positive for confusion. Negative for agitation and behavioral problems.    Immunization History  Administered Date(s) Administered  . Influenza Inj  Mdck Quad Pf 03/17/2016  . Influenza Whole 03/13/2013  . Influenza, High Dose Seasonal PF 03/14/2019  . Influenza,inj,Quad PF,6+ Mos 03/20/2018  . Influenza-Unspecified 03/04/2014, 03/12/2015, 03/23/2017  . PPD Test 07/05/2012  . Pneumococcal Conjugate-13 07/01/2016  . Pneumococcal Polysaccharide-23 07/16/1996  . Td 07/01/2016  . Zoster Recombinat (Shingrix) 06/30/2017   Pertinent  Health Maintenance Due  Topic Date Due  . HEMOGLOBIN A1C  06/12/2019  . OPHTHALMOLOGY EXAM  02/11/2020  . FOOT EXAM  02/14/2020  . INFLUENZA VACCINE  Completed  . PNA vac Low Risk Adult  Completed   Fall Risk  04/12/2019 07/12/2018 04/28/2018 10/03/2017 09/28/2016  Falls in the past year? - 1 Exclusion - non ambulatory No No  Number falls in past yr: - 0 - - -  Injury with Fall? - 1 - - -  Risk for fall due to : History of fall(s);Impaired balance/gait;Impaired mobility;Mental status change;Medication side effect History of fall(s);Impaired mobility;Impaired balance/gait;Mental status change Impaired mobility - -  Follow up Falls evaluation completed;Education provided;Falls prevention discussed Falls evaluation completed;Education provided;Falls prevention discussed Education provided - -  Comment at [place] involved bathing apparatus and staff  were reeducated  - - -   Functional Status Survey:    Vitals:   05/10/19 1147  Weight: 174 lb (78.9 kg)   Body mass index is 24.27 kg/m. Physical Exam Constitutional:      General: He is not in acute distress.    Appearance: He is not diaphoretic.  HENT:     Head: Normocephalic and atraumatic.     Mouth/Throat:     Comments: Mouth with food fragments, spitting out on his shirt Neck:     Thyroid: No thyromegaly.     Vascular: No JVD.     Trachea: No tracheal deviation.  Cardiovascular:     Rate and Rhythm: Normal rate and regular rhythm.     Heart sounds: No murmur.  Pulmonary:     Effort: Pulmonary effort is normal. No respiratory distress.      Breath sounds: No wheezing.     Comments: Decreased bases  Abdominal:     General: Bowel sounds are normal. There is no distension.     Palpations: Abdomen is soft.     Tenderness: There is no abdominal tenderness.  Musculoskeletal:     Right lower leg: No edema.     Left lower leg: Edema present.     Comments: Left hand with edema, no redness or warmth  Lymphadenopathy:     Cervical: No cervical adenopathy.  Skin:    General: Skin is warm and dry.  Neurological:     Mental Status: He is alert. Mental status is at baseline.     Comments: Left sided weakness due to prior CVA. Hear bob during visit. Dysarthria as well.   Psychiatric:        Mood and Affect: Mood normal.     Labs reviewed: Recent Labs    06/05/18 0600 09/29/18 0000  NA 142 135*  K 4.9 4.3  BUN 23* 31*  CREATININE 1.2 1.4*   No results for input(s): AST, ALT, ALKPHOS, BILITOT, PROT, ALBUMIN in the last 8760 hours. Recent Labs    09/29/18 0000 10/01/18 0000  WBC 13.6 10.7  NEUTROABS 10  --   HGB 10.8* 11.4*  HCT 32* 32*  PLT 225 225   Lab Results  Component Value Date   TSH 1.67 03/05/2018   Lab Results  Component Value Date   HGBA1C 7.7 12/10/2018   Lab Results  Component Value Date   CHOL 152 06/27/2013   HDL 55 06/27/2013   LDLCALC 78 06/27/2013   TRIG 83 06/27/2013    Significant Diagnostic Results in last 30 days:  No results found.  Assessment/Plan 1. Type 2 diabetes mellitus with stage 3b chronic kidney disease, with long-term current use of insulin (HCC) Needs A1C check. If rising would increase Levemir dosing   2. Left hemiparesis ([place]) Due to prior CVA. Needs to keep his left hand on the tray provided on the [facility] to prevent dependent edema. Instructed staff to remind him.   3. Stage 3b chronic kidney disease Continue to periodically monitor BMP and avoid nephrotoxic agents=  4. Benign prostatic hyperplasia without lower urinary tract symptoms No symptoms, continue flomax.    5. 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  Continue modified diet for dysphagia   6. Extrapyramidal symptom Noted on exam but it is not bothersome to him. Due to his goals of care which are comfort based, would not taper this medication.   7. Dysphagia Continue D2 diet with asp prec. Continue biotene  with oral care 3-4 x daily     Family/ staff Communication: staff/resident  Labs/tests ordered:  A1C and BMP

## 2019-05-11 ENCOUNTER — Telehealth: Payer: Self-pay | Admitting: Nurse Practitioner

## 2019-05-11 NOTE — Telephone Encounter (Signed)
Called and reported CBG 413, P 84, RR 20, will call back for the rest of the vital signs. Takes Levemir 20 u qd. Last elevated CBG in 300 was 05/03/19. Recommended to check CBG 2 hours after dinner, call back if CBG>400

## 2019-05-13 LAB — BASIC METABOLIC PANEL
BUN: 21 (ref 4–21)
CO2: 25 — AB (ref 13–22)
Chloride: 106 (ref 99–108)
Creatinine: 1.3 (ref 0.6–1.3)
Glucose: 179
Potassium: 4.4 (ref 3.4–5.3)
Sodium: 141 (ref 137–147)

## 2019-05-13 LAB — COMPREHENSIVE METABOLIC PANEL: Calcium: 9.1 (ref 8.7–10.7)

## 2019-05-13 LAB — HEMOGLOBIN A1C: Hemoglobin A1C: 7.4

## 2019-06-17 LAB — NOVEL CORONAVIRUS, NAA: SARS-CoV-2, NAA: NOT DETECTED

## 2019-06-21 ENCOUNTER — Non-Acute Institutional Stay (SKILLED_NURSING_FACILITY): Payer: Medicare PPO | Admitting: Adult Health

## 2019-06-21 DIAGNOSIS — I10 Essential (primary) hypertension: Secondary | ICD-10-CM

## 2019-06-21 DIAGNOSIS — E1121 Type 2 diabetes mellitus with diabetic nephropathy: Secondary | ICD-10-CM

## 2019-06-21 DIAGNOSIS — N1832 Chronic kidney disease, stage 3b: Secondary | ICD-10-CM

## 2019-06-21 DIAGNOSIS — N400 Enlarged prostate without lower urinary tract symptoms: Secondary | ICD-10-CM

## 2019-06-21 DIAGNOSIS — R1310 Dysphagia, unspecified: Secondary | ICD-10-CM

## 2019-06-21 DIAGNOSIS — E1122 Type 2 diabetes mellitus with diabetic chronic kidney disease: Secondary | ICD-10-CM

## 2019-06-21 DIAGNOSIS — F3176 Bipolar disorder, in full remission, most recent episode depressed: Secondary | ICD-10-CM

## 2019-06-21 DIAGNOSIS — Z794 Long term (current) use of insulin: Secondary | ICD-10-CM

## 2019-06-21 NOTE — Progress Notes (Signed)
Location:  Occupational psychologist of Service:  SNF (31) Provider:  Cindi Carbon, ANP [place] [phone]   Gayland Curry, DO  Patient Care Team: Gayland Curry, DO as PCP - General (Geriatric Medicine) Fay Records, Well [name]  Extended Emergency Contact Information Primary Emergency Contact: [facility] Address: [address]          [place], [place] [address] [name] of [place] Phone: [phone] Mobile Phone: [phone] Relation: Daughter  Code Status:  DNR Goals of care: Advanced Directive information Advanced Directives 04/09/2019  Does Patient Have a Medical Advance Directive? Yes  Type of Paramedic of [place];Living will;Out of facility DNR (pink MOST or yellow form)  Does patient want to make changes to medical advance directive? No - Patient declined  Copy of [place] in Chart? Yes - validated most recent copy scanned in chart (See row information)  Pre-existing out of facility DNR order (yellow form or pink MOST form) Pink MOST form placed in chart (order not valid for inpatient use);Yellow form placed in chart (order not valid for inpatient use)     Chief Complaint  Patient presents with  . Medical Management of Chronic Issues    HPI:  Pt is a 86 y.o. male seen today for medical management of chronic diseases.    He has a hx of CVA with right sided weakness. He uses a WC with tray for arm support. Has incontinence. No urinary complaints. Vitals are WNL. Blood sugars are better controlled when compared to previous numbers but at times run over 200 in the evening. He tends to he sweets in the afternoon.   Currently on a D2 ground diet due to a hx of dysphagia and seems to be tolerating well.   Mood appears stable with no reports of mania, agitation, depression, or anxiety.   There are no acute complaints regarding his care.   Past Medical History:    Diagnosis Date  . Anemia, iron deficiency 12/30/2012  . Basal cell carcinoma   . Bipolar 1 disorder ([place])   . CKD (chronic kidney disease), stage II 07/09/2012   07/16/12:   stage II chronic kidney disease by GFR.  Follow lab at interval    . Dementia ([place])   . Diabetes ([place])   . GI bleed   . Hypertension   . Mixed Alzheimer's and vascular dementia ([place]) 12/01/2012   06/07/12: STML re: CVA- contribute to debility/dependence. Pt. is aware of deficit. Will benefit from [place] environment for assistance, supervision, socialization    07/16/12:  Admission MDS reviewed: BIMS score 13/15,  functional status: Pt requires limited to extensive assist with all ADLs except eating (supervision)    . Pressure ulcer of sacrum 12/07/2012  . Squamous cell carcinoma of skin of scalp 01/24/14   anterior and posterior scalp   . Stroke [facility]) 08/2009   Left side with residual deficit   Past Surgical History:  Procedure Laterality Date  . HIP ARTHROPLASTY Left 12/01/2012   Procedure: ARTHROPLASTY BIPOLAR HIP;  Surgeon: Johnn Hai, MD;  Location: [facility]S;  Service: Orthopedics;  Laterality: Left;  . SKIN LESION EXCISION  01/24/14   Anterior and posterio scalp Squamous cell [name] PA    No Known Allergies  Outpatient Encounter Medications as of 06/21/2019  Medication Sig  . acetaminophen (TYLENOL) 325 MG tablet Take 650 mg by mouth every 6 (six) hours as needed for pain.  Marland Kitchen  antiseptic oral rinse (BIOTENE) LIQD 15 mLs by Mouth Rinse route 4 (four) times daily.  Marland Kitchen aspirin 81 MG tablet Take 81 mg by mouth daily.  . cholecalciferol (VITAMIN D) 1000 UNITS tablet Take 1,000 Units by mouth every morning.  . clomiPRAMINE (ANAFRANIL) 25 MG capsule Take 1 capsule (25 mg total) by mouth at bedtime.  . donepezil (ARICEPT) 10 MG tablet Take 10 mg by mouth daily.  . Insulin Detemir (LEVEMIR FLEXTOUCH) 100 UNIT/ML Pen Hold for CBG <85  15 units in the am and 20 units in the pm  . ketoconazole (NIZORAL) 2 %  shampoo Apply 1 application topically 2 (two) times a week.  . latanoprost (XALATAN) 0.005 % ophthalmic solution 1 drop at bedtime.  . metoprolol tartrate (LOPRESSOR) 25 MG tablet Take 75 mg by mouth 2 (two) times daily. Hold for heart rate less than 60  . Multiple Vitamins-Minerals (CENTRUM SILVER 50+MEN) TABS Take by mouth daily.  . QUEtiapine (SEROQUEL) 25 MG tablet Take 0.5 tablets (12.5 mg total) by mouth at bedtime.  . tamsulosin (FLOMAX) 0.4 MG CAPS Take 0.4 mg by mouth daily after breakfast.   No facility-administered encounter medications on file as of 06/21/2019.    Review of Systems  Constitutional: Negative for activity change, appetite change, chills, diaphoresis, fatigue, fever and unexpected weight change.  HENT: Negative for congestion.   Respiratory: Negative for cough, shortness of breath, wheezing and stridor.   Cardiovascular: Positive for leg swelling. Negative for chest pain and palpitations.  Gastrointestinal: Negative for abdominal distention, abdominal pain, constipation and diarrhea.  Genitourinary: Negative for difficulty urinating and dysuria.  Musculoskeletal: Positive for gait problem. Negative for arthralgias, back pain, joint swelling and myalgias.  Neurological: Positive for weakness. Negative for dizziness, seizures, syncope, facial asymmetry, speech difficulty and headaches.  Hematological: Negative for adenopathy. Does not bruise/bleed easily.  Psychiatric/Behavioral: Positive for confusion. Negative for agitation and behavioral problems. The patient is not nervous/anxious and is not hyperactive.     Immunization History  Administered Date(s) Administered  . Influenza Inj Mdck Quad Pf 03/17/2016  . Influenza Whole 03/13/2013  . Influenza, High Dose Seasonal PF 03/14/2019  . Influenza,inj,Quad PF,6+ Mos 03/20/2018  . Influenza-Unspecified 03/04/2014, 03/12/2015, 03/23/2017  . Moderna SARS-COVID-2 Vaccination 06/04/2019  . PPD Test 07/05/2012  .  Pneumococcal Conjugate-13 07/01/2016  . Pneumococcal Polysaccharide-23 07/16/1996  . Td 07/01/2016  . Zoster Recombinat (Shingrix) 06/30/2017   Pertinent  Health Maintenance Due  Topic Date Due  . HEMOGLOBIN A1C  06/12/2019  . OPHTHALMOLOGY EXAM  02/11/2020  . FOOT EXAM  02/14/2020  . INFLUENZA VACCINE  Completed  . PNA vac Low Risk Adult  Completed   Fall Risk  04/12/2019 07/12/2018 04/28/2018 10/03/2017 09/28/2016  Falls in the past year? - 1 Exclusion - non ambulatory No No  Number falls in past yr: - 0 - - -  Injury with Fall? - 1 - - -  Risk for fall due to : History of fall(s);Impaired balance/gait;Impaired mobility;Mental status change;Medication side effect History of fall(s);Impaired mobility;Impaired balance/gait;Mental status change Impaired mobility - -  Follow up Falls evaluation completed;Education provided;Falls prevention discussed Falls evaluation completed;Education provided;Falls prevention discussed Education provided - -  Comment at [facility] involved bathing apparatus and staff were reeducated  - - -   Functional Status Survey:    Vitals:   06/21/19 0936  Weight: 175 lb 6.4 oz (79.6 kg)   Body mass index is 24.46 kg/m. Physical Exam Vitals and nursing note reviewed.  Constitutional:  General: He is not in acute distress.    Appearance: He is not diaphoretic.  HENT:     Head: Normocephalic and atraumatic.     Mouth/Throat:     Mouth: Mucous membranes are moist.     Pharynx: Oropharynx is clear. No oropharyngeal exudate.  Neck:     Thyroid: No thyromegaly.     Vascular: No JVD.     Trachea: No tracheal deviation.  Cardiovascular:     Rate and Rhythm: Normal rate and regular rhythm.     Heart sounds: No murmur.  Pulmonary:     Effort: Pulmonary effort is normal. No respiratory distress.     Breath sounds: Normal breath sounds. No wheezing.  Abdominal:     General: Bowel sounds are normal. There is no distension.     Palpations: Abdomen is soft.       Tenderness: There is no abdominal tenderness.  Musculoskeletal:     Right lower leg: Edema present.     Left lower leg: No edema.  Lymphadenopathy:     Cervical: No cervical adenopathy.  Skin:    General: Skin is warm and dry.  Neurological:     Mental Status: He is alert. Mental status is at baseline.     Comments: Right sided weakness   Psychiatric:        Mood and Affect: Mood normal.     Labs reviewed: Recent Labs    09/29/18 0000  NA 135*  K 4.3  BUN 31*  CREATININE 1.4*   No results for input(s): AST, ALT, ALKPHOS, BILITOT, PROT, ALBUMIN in the last 8760 hours. Recent Labs    09/29/18 0000 10/01/18 0000  WBC 13.6 10.7  NEUTROABS 10  --   HGB 10.8* 11.4*  HCT 32* 32*  PLT 225 225   Lab Results  Component Value Date   TSH 1.67 03/05/2018   Lab Results  Component Value Date   HGBA1C 7.7 12/10/2018   Lab Results  Component Value Date   CHOL 152 06/27/2013   HDL 55 06/27/2013   LDLCALC 78 06/27/2013   TRIG 83 06/27/2013    Significant Diagnostic Results in last 30 days:  No results found.  Assessment/Plan 1. Type 2 diabetes mellitus with stage 3b chronic kidney disease, with long-term current use of insulin (HCC) Continue Levemir 15 units in the am and 20 units in the pm Check A1C   2. Stage 3b chronic kidney disease Continue to periodically monitor BMP and avoid nephrotoxic agents  3. Bipolar 1 disorder, depressed, full remission (HCC) Unchanged Continue current medications. Would not try another GDR given prior failures and the pandemic.  4. Benign prostatic hyperplasia without lower urinary tract symptoms Controlled, continue Flomax 0.4 mg qhs  6. Essential hypertension Controlled, continue lopressor 25 mg bid  7. Dysphagia, unspecified type Continue modified diet and asp prec.     Family/ staff Communication: discussed with his nurse   Labs/tests ordered:  A1C

## 2019-06-24 ENCOUNTER — Encounter: Payer: Self-pay | Admitting: Adult Health

## 2019-08-12 ENCOUNTER — Encounter: Payer: Self-pay | Admitting: Adult Health

## 2019-08-12 ENCOUNTER — Non-Acute Institutional Stay (SKILLED_NURSING_FACILITY): Payer: Medicare PPO | Admitting: Adult Health

## 2019-08-12 DIAGNOSIS — G309 Alzheimer's disease, unspecified: Secondary | ICD-10-CM | POA: Diagnosis not present

## 2019-08-12 DIAGNOSIS — R1312 Dysphagia, oropharyngeal phase: Secondary | ICD-10-CM

## 2019-08-12 DIAGNOSIS — I10 Essential (primary) hypertension: Secondary | ICD-10-CM

## 2019-08-12 DIAGNOSIS — F0150 Vascular dementia without behavioral disturbance: Secondary | ICD-10-CM

## 2019-08-12 DIAGNOSIS — G8194 Hemiplegia, unspecified affecting left nondominant side: Secondary | ICD-10-CM | POA: Diagnosis not present

## 2019-08-12 DIAGNOSIS — E1121 Type 2 diabetes mellitus with diabetic nephropathy: Secondary | ICD-10-CM | POA: Diagnosis not present

## 2019-08-12 DIAGNOSIS — F0280 Dementia in other diseases classified elsewhere without behavioral disturbance: Secondary | ICD-10-CM

## 2019-08-12 DIAGNOSIS — F3176 Bipolar disorder, in full remission, most recent episode depressed: Secondary | ICD-10-CM

## 2019-08-12 DIAGNOSIS — Z794 Long term (current) use of insulin: Secondary | ICD-10-CM

## 2019-08-12 DIAGNOSIS — N1832 Chronic kidney disease, stage 3b: Secondary | ICD-10-CM

## 2019-08-12 LAB — HM DIABETES FOOT EXAM

## 2019-08-12 NOTE — Progress Notes (Signed)
Location:  Occupational psychologist of Service:  SNF (31) Provider:   Cindi Carbon, ANP [place] [phone]   Gayland Curry, DO  Patient Care Team: Gayland Curry, DO as PCP - General (Geriatric Medicine) Fay Records, Well [name]  Extended Emergency Contact Information Primary Emergency Contact: [facility] Address: [address]          [place], [place] [address] [name] of [place] Phone: [phone] Mobile Phone: [phone] Relation: Daughter  Code Status:  DNR Goals of care: Advanced Directive information Advanced Directives 04/09/2019  Does Patient Have a Medical Advance Directive? Yes  Type of Paramedic of [place];Living will;Out of facility DNR (pink MOST or yellow form)  Does patient want to make changes to medical advance directive? No - Patient declined  Copy of [place] in Chart? Yes - validated most recent copy scanned in chart (See row information)  Pre-existing out of facility DNR order (yellow form or pink MOST form) Pink MOST form placed in chart (order not valid for inpatient use);Yellow form placed in chart (order not valid for inpatient use)     Chief Complaint  Patient presents with  . Medical Management of Chronic Issues    HPI:  Pt is a 86 y.o. male seen today for medical management of chronic diseases.   Hx of CVA with left sided weakness. Needs a hoyer lift for transfers.   CBG range 130-150 in the morning and 250-300 in the evening  Continues D2 diet ground with no issues chewing and choking  Bipolar: no mood swings, mania, anxiety, depressive symptoms, low appetite, etc noted. No issues with increased confusion, delirium, etc.   BP WNL in matrix  CKD:  Lab Results  Component Value Date   BUN 21 05/13/2019   Lab Results  Component Value Date   CREATININE 1.3 05/13/2019     Past Medical History:  Diagnosis Date  . Anemia,  iron deficiency 12/30/2012  . Basal cell carcinoma   . Bipolar 1 disorder ([place])   . CKD (chronic kidney disease), stage II 07/09/2012   07/16/12:   stage II chronic kidney disease by GFR.  Follow lab at interval    . Dementia ([place])   . Diabetes ([place])   . GI bleed   . Hypertension   . Mixed Alzheimer's and vascular dementia ([place]) 12/01/2012   06/07/12: STML re: CVA- contribute to debility/dependence. Pt. is aware of deficit. Will benefit from [place] environment for assistance, supervision, socialization    07/16/12:  Admission MDS reviewed: BIMS score 13/15,  functional status: Pt requires limited to extensive assist with all ADLs except eating (supervision)    . Pressure ulcer of sacrum 12/07/2012  . Squamous cell carcinoma of skin of scalp 01/24/14   anterior and posterior scalp   . Stroke [facility]) 08/2009   Left side with residual deficit   Past Surgical History:  Procedure Laterality Date  . HIP ARTHROPLASTY Left 12/01/2012   Procedure: ARTHROPLASTY BIPOLAR HIP;  Surgeon: Johnn Hai, MD;  Location: [facility];  Service: Orthopedics;  Laterality: Left;  . SKIN LESION EXCISION  01/24/14   Anterior and posterio scalp Squamous cell [name] PA    No Known Allergies  Outpatient Encounter Medications as of 08/12/2019  Medication Sig  . antiseptic oral rinse (BIOTENE) LIQD 15 mLs by Mouth Rinse route 4 (four) times daily.  Marland Kitchen aspirin 81 MG tablet Take 81 mg by mouth daily.  Marland Kitchen  cholecalciferol (VITAMIN D) 1000 UNITS tablet Take 1,000 Units by mouth every morning.  . clomiPRAMINE (ANAFRANIL) 25 MG capsule Take 1 capsule (25 mg total) by mouth at bedtime.  . donepezil (ARICEPT) 10 MG tablet Take 10 mg by mouth daily.  . Insulin Detemir (LEVEMIR FLEXTOUCH) 100 UNIT/ML Pen Hold for CBG <85  15 units in the am and 20 units in the pm  . ketoconazole (NIZORAL) 2 % shampoo Apply 1 application topically 2 (two) times a week.  . latanoprost (XALATAN) 0.005 % ophthalmic solution 1 drop at bedtime.  .  metoprolol tartrate (LOPRESSOR) 25 MG tablet Take 75 mg by mouth 2 (two) times daily. Hold for heart rate less than 60  . Multiple Vitamins-Minerals (CENTRUM SILVER 50+MEN) TABS Take by mouth daily.  . QUEtiapine (SEROQUEL) 25 MG tablet Take 0.5 tablets (12.5 mg total) by mouth at bedtime.  . tamsulosin (FLOMAX) 0.4 MG CAPS Take 0.4 mg by mouth daily after breakfast.  . [DISCONTINUED] acetaminophen (TYLENOL) 325 MG tablet Take 650 mg by mouth every 6 (six) hours as needed for pain.   No facility-administered encounter medications on file as of 08/12/2019.    Review of Systems  Constitutional: Negative for activity change, appetite change, chills, diaphoresis, fatigue, fever and unexpected weight change.  Respiratory: Negative for cough, shortness of breath, wheezing and stridor.   Cardiovascular: Positive for leg swelling. Negative for chest pain and palpitations.  Gastrointestinal: Negative for abdominal distention, abdominal pain, constipation and diarrhea.  Genitourinary: Negative for difficulty urinating and dysuria.  Musculoskeletal: Positive for gait problem. Negative for arthralgias, back pain, joint swelling and myalgias.  Neurological: Positive for weakness. Negative for dizziness, seizures, syncope, facial asymmetry, speech difficulty and headaches.  Hematological: Negative for adenopathy. Does not bruise/bleed easily.  Psychiatric/Behavioral: Positive for confusion. Negative for agitation and behavioral problems.    Immunization History  Administered Date(s) Administered  . Influenza Inj Mdck Quad Pf 03/17/2016  . Influenza Whole 03/13/2013  . Influenza, High Dose Seasonal PF 03/14/2019  . Influenza,inj,Quad PF,6+ Mos 03/20/2018  . Influenza-Unspecified 03/04/2014, 03/12/2015, 03/23/2017  . Moderna SARS-COVID-2 Vaccination 06/04/2019, 07/02/2019  . PPD Test 07/05/2012  . Pneumococcal Conjugate-13 07/01/2016  . Pneumococcal Polysaccharide-23 07/16/1996  . Td 07/01/2016  .  Zoster Recombinat (Shingrix) 06/30/2017   Pertinent  Health Maintenance Due  Topic Date Due  . HEMOGLOBIN A1C  11/11/2019  . OPHTHALMOLOGY EXAM  02/11/2020  . FOOT EXAM  08/11/2020  . INFLUENZA VACCINE  Completed  . PNA vac Low Risk Adult  Completed   Fall Risk  04/12/2019 07/12/2018 04/28/2018 10/03/2017 09/28/2016  Falls in the past year? - 1 Exclusion - non ambulatory No No  Number falls in past yr: - 0 - - -  Injury with Fall? - 1 - - -  Risk for fall due to : History of fall(s);Impaired balance/gait;Impaired mobility;Mental status change;Medication side effect History of fall(s);Impaired mobility;Impaired balance/gait;Mental status change Impaired mobility - -  Follow up Falls evaluation completed;Education provided;Falls prevention discussed Falls evaluation completed;Education provided;Falls prevention discussed Education provided - -  Comment at [place] involved bathing apparatus and staff were reeducated  - - -   Functional Status Survey:    Vitals:   08/12/19 1425  Weight: 174 lb 12.8 oz (79.3 kg)   Body mass index is 24.38 kg/m. Physical Exam Vitals and nursing note reviewed.  Constitutional:      General: He is not in acute distress.    Appearance: He is not diaphoretic.  HENT:  Head: Normocephalic and atraumatic.  Neck:     Thyroid: No thyromegaly.     Vascular: No JVD.     Trachea: No tracheal deviation.  Cardiovascular:     Rate and Rhythm: Normal rate and regular rhythm.     Pulses:          Dorsalis pedis pulses are 1+ on the right side and 1+ on the left side.     Heart sounds: No murmur.  Pulmonary:     Effort: Pulmonary effort is normal. No respiratory distress.     Breath sounds: Normal breath sounds. No wheezing.  Abdominal:     General: Bowel sounds are normal. There is no distension.     Palpations: Abdomen is soft.     Tenderness: There is no abdominal tenderness.  Musculoskeletal:        General: Swelling (left hand) present.      Cervical back: Normal range of motion and neck supple.     Right lower leg: No edema.     Left lower leg: Edema (+1) present.     Right foot: Normal range of motion. No deformity or foot drop.     Left foot: Normal range of motion. No deformity or foot drop.  Feet:     Right foot:     Skin integrity: Skin integrity normal.     Toenail Condition: Right toenails are normal.     Left foot:     Skin integrity: Skin integrity normal.     Toenail Condition: Left toenails are normal.  Lymphadenopathy:     Cervical: No cervical adenopathy.  Skin:    General: Skin is warm and dry.  Neurological:     Mental Status: He is alert.  Psychiatric:        Mood and Affect: Mood normal.     Labs reviewed: Recent Labs    09/29/18 0000 05/13/19 0000  NA 135* 141  K 4.3 4.4  CL  --  106  CO2  --  25*  BUN 31* 21  CREATININE 1.4* 1.3  CALCIUM  --  9.1   No results for input(s): AST, ALT, ALKPHOS, BILITOT, PROT, ALBUMIN in the last 8760 hours. Recent Labs    09/29/18 0000 10/01/18 0000  WBC 13.6 10.7  NEUTROABS 10  --   HGB 10.8* 11.4*  HCT 32* 32*  PLT 225 225   Lab Results  Component Value Date   TSH 1.67 03/05/2018   Lab Results  Component Value Date   HGBA1C 7.4 05/13/2019   Lab Results  Component Value Date   CHOL 152 06/27/2013   HDL 55 06/27/2013   LDLCALC 78 06/27/2013   TRIG 83 06/27/2013    Significant Diagnostic Results in last 30 days:  No results found.  Assessment/Plan 1. Left hemiparesis ([place]) Due to prior CVA Continues on baby aspirin for prevention   2. Oropharyngeal dysphagia Continue D2 diet with ground meats. Upright for all meals.   3. Type 2 diabetes mellitus with stage 3b chronic kidney disease, with long-term current use of insulin (HCC) Increase Levemir to 20 units bid  Foot exam complete.  Eye exam due in Sept   4. Mixed Alzheimer's and vascular dementia ([place]) MMSE 07/03/19 9/30 failed clock Progressive decline in cognition and physical  function c/w the disease. Continue supportive care in the skilled environment. Continue Aricept   5. Essential hypertension Controlled Continue metoprolol 75 mg bid   6. Bipolar 1 disorder, depressed, full remission ([place]) Doing  well. Continue seroquel and Anafranil. Has head tick that is unchanged. Would not taper due to past failures.   7. Stage 3b chronic kidney disease Continue to periodically monitor BMP and avoid nephrotoxic agents    Family/ staff Communication: nurse  Labs/tests ordered:  NA

## 2019-08-27 ENCOUNTER — Non-Acute Institutional Stay (SKILLED_NURSING_FACILITY): Payer: Medicare PPO | Admitting: Internal Medicine

## 2019-08-27 ENCOUNTER — Encounter: Payer: Self-pay | Admitting: Internal Medicine

## 2019-08-27 DIAGNOSIS — F0150 Vascular dementia without behavioral disturbance: Secondary | ICD-10-CM

## 2019-08-27 DIAGNOSIS — G8194 Hemiplegia, unspecified affecting left nondominant side: Secondary | ICD-10-CM | POA: Diagnosis not present

## 2019-08-27 DIAGNOSIS — I10 Essential (primary) hypertension: Secondary | ICD-10-CM

## 2019-08-27 DIAGNOSIS — E1121 Type 2 diabetes mellitus with diabetic nephropathy: Secondary | ICD-10-CM

## 2019-08-27 DIAGNOSIS — N1832 Chronic kidney disease, stage 3b: Secondary | ICD-10-CM

## 2019-08-27 DIAGNOSIS — F3176 Bipolar disorder, in full remission, most recent episode depressed: Secondary | ICD-10-CM

## 2019-08-27 DIAGNOSIS — G309 Alzheimer's disease, unspecified: Secondary | ICD-10-CM | POA: Diagnosis not present

## 2019-08-27 DIAGNOSIS — R1312 Dysphagia, oropharyngeal phase: Secondary | ICD-10-CM | POA: Diagnosis not present

## 2019-08-27 DIAGNOSIS — Z794 Long term (current) use of insulin: Secondary | ICD-10-CM

## 2019-08-27 DIAGNOSIS — F0280 Dementia in other diseases classified elsewhere without behavioral disturbance: Secondary | ICD-10-CM

## 2019-08-27 NOTE — Progress Notes (Signed)
Location:  [facility] Room Number: [id number] Place of Service:  SNF ([phone]) Provider:   Gayland Curry, DO  Patient Care Team: Gayland Curry, DO as PCP - General (Geriatric Medicine) Community, [name]  Extended Emergency Contact Information Primary Emergency Contact: [facility] Address: [address]          [place], [place] [address] [name] of [place] Phone: [phone] Mobile Phone: [phone] Relation: Daughter  Code Status:  DNR, MOST Goals of care: Advanced Directive information Advanced Directives 08/27/2019  Does Patient Have a Medical Advance Directive? Yes  Type of Advance Directive Out of facility DNR (pink MOST or yellow form)  Does patient want to make changes to medical advance directive? No - Guardian declined  Copy of [place] in Chart? -  Pre-existing out of facility DNR order (yellow form or pink MOST form) Pink MOST/Yellow Form most recent copy in chart - Physician notified to receive inpatient order     Chief Complaint  Patient presents with  . Medical Management of Chronic Issues    Routine visit     HPI:  Pt is a 86 y.o. male seen today for medical management of chronic diseases.  Seen today for physician visit as not seen in feb.  NP did see earlier this month for his dysphagia.  Magnum is stable.  His dayshift nurse notes he has not had recent skin issues.    Lab Results  Component Value Date   HGBA1C 7.4 05/13/2019  His PM sugars were running up and levemir was increased to 20 units bid by NP.    Dementia continues to progress--he's less verbal and the speech he does have is less clear and more of a word salad.  BIpolar meds remain the same due to behavior challenges and tearfulness when discontinued or reduced historically.  Weight is stable since Nov around 174 lbs.    Past Medical History:  Diagnosis Date  . Anemia, iron deficiency 12/30/2012  . Basal cell  carcinoma   . Bipolar 1 disorder ([place])   . CKD (chronic kidney disease), stage II 07/09/2012   07/16/12:   stage II chronic kidney disease by GFR.  Follow lab at interval    . Dementia ([place])   . Diabetes ([place])   . GI bleed   . Hypertension   . Mixed Alzheimer's and vascular dementia ([place]) 12/01/2012   06/07/12: STML re: CVA- contribute to debility/dependence. Pt. is aware of deficit. Will benefit from [place] environment for assistance, supervision, socialization    07/16/12:  Admission MDS reviewed: BIMS score 13/15,  functional status: Pt requires limited to extensive assist with all ADLs except eating (supervision)    . Pressure ulcer of sacrum 12/07/2012  . Squamous cell carcinoma of skin of scalp 01/24/14   anterior and posterior scalp   . Stroke [facility]) 08/2009   Left side with residual deficit   Past Surgical History:  Procedure Laterality Date  . HIP ARTHROPLASTY Left 12/01/2012   Procedure: ARTHROPLASTY BIPOLAR HIP;  Surgeon: Johnn Hai, MD;  Location: [facility];  Service: Orthopedics;  Laterality: Left;  . SKIN LESION EXCISION  01/24/14   Anterior and posterio scalp Squamous cell [name] PA    No Known Allergies  Outpatient Encounter Medications as of 08/27/2019  Medication Sig  . antiseptic oral rinse (BIOTENE) LIQD 15 mLs by Mouth Rinse route 4 (four) times daily.  Marland Kitchen aspirin 81 MG tablet Take 81 mg  by mouth daily.  . cholecalciferol (VITAMIN D) 1000 UNITS tablet Take 1,000 Units by mouth every morning.  . clomiPRAMINE (ANAFRANIL) 25 MG capsule Take 1 capsule (25 mg total) by mouth at bedtime.  . donepezil (ARICEPT) 10 MG tablet Take 10 mg by mouth daily.  . Insulin Detemir (LEVEMIR FLEXTOUCH) 100 UNIT/ML Pen Hold for CBG <85  15 units in the am and 20 units in the pm  . ketoconazole (NIZORAL) 2 % shampoo Apply 1 application topically 2 (two) times a week.  . latanoprost (XALATAN) 0.005 % ophthalmic solution 1 drop at bedtime.  . metoprolol tartrate (LOPRESSOR) 25 MG  tablet Take 75 mg by mouth 2 (two) times daily. Hold for heart rate less than 60  . Multiple Vitamins-Minerals (CENTRUM SILVER 50+MEN) TABS Take by mouth daily.  . QUEtiapine (SEROQUEL) 25 MG tablet Take 0.5 tablets (12.5 mg total) by mouth at bedtime.  . tamsulosin (FLOMAX) 0.4 MG CAPS Take 0.4 mg by mouth daily after breakfast.   No facility-administered encounter medications on file as of 08/27/2019.    Review of Systems  Constitutional: Positive for fatigue. Negative for appetite change, chills, fever and unexpected weight change.  HENT: Positive for trouble swallowing. Negative for congestion and sore throat.   Eyes: Negative for visual disturbance.  Respiratory: Negative for chest tightness and shortness of breath.   Gastrointestinal: Negative for abdominal pain, constipation, diarrhea, nausea and vomiting.  Genitourinary: Negative for dysuria.  Musculoskeletal: Positive for gait problem.  Skin: Negative for color change.  Neurological: Positive for speech difficulty and weakness.  Psychiatric/Behavioral: Positive for confusion. Negative for agitation and sleep disturbance.    Immunization History  Administered Date(s) Administered  . Influenza Inj Mdck Quad Pf 03/17/2016  . Influenza Whole 03/13/2013  . Influenza, High Dose Seasonal PF 03/14/2019  . Influenza,inj,Quad PF,6+ Mos 03/20/2018  . Influenza-Unspecified 03/04/2014, 03/12/2015, 03/23/2017  . Moderna SARS-COVID-2 Vaccination 06/04/2019, 07/02/2019  . PPD Test 07/05/2012  . Pneumococcal Conjugate-13 07/01/2016  . Pneumococcal Polysaccharide-23 07/16/1996  . Td 07/01/2016  . Zoster Recombinat (Shingrix) 06/30/2017   Pertinent  Health Maintenance Due  Topic Date Due  . HEMOGLOBIN A1C  11/11/2019  . OPHTHALMOLOGY EXAM  02/11/2020  . FOOT EXAM  08/11/2020  . INFLUENZA VACCINE  Completed  . PNA vac Low Risk Adult  Completed   Fall Risk  04/12/2019 07/12/2018 04/28/2018 10/03/2017 09/28/2016  Falls in the past year? - 1  Exclusion - non ambulatory No No  Number falls in past yr: - 0 - - -  Injury with Fall? - 1 - - -  Risk for fall due to : History of fall(s);Impaired balance/gait;Impaired mobility;Mental status change;Medication side effect History of fall(s);Impaired mobility;Impaired balance/gait;Mental status change Impaired mobility - -  Follow up Falls evaluation completed;Education provided;Falls prevention discussed Falls evaluation completed;Education provided;Falls prevention discussed Education provided - -  Comment at [facility] involved bathing apparatus and staff were reeducated  - - -   Functional Status Survey:    Vitals:   08/27/19 1029  BP: (!) 147/75  Pulse: 80  Temp: (!) 97.2 F (36.2 C)  SpO2: 97%  Weight: 174 lb 12.8 oz (79.3 kg)  Height: 5\' 11"  (1.803 m)   Body mass index is 24.38 kg/m. Physical Exam Vitals reviewed.  Constitutional:      Appearance: Normal appearance.  HENT:     Head: Normocephalic and atraumatic.  Cardiovascular:     Rate and Rhythm: Normal rate and regular rhythm.     Pulses: Normal pulses.  Heart sounds: Normal heart sounds.  Pulmonary:     Effort: Pulmonary effort is normal.     Breath sounds: Normal breath sounds. No wheezing, rhonchi or rales.  Abdominal:     General: Bowel sounds are normal.  Musculoskeletal:     Comments: Left hemiparesis  Skin:    General: Skin is warm and dry.  Neurological:     Mental Status: He is alert.     Comments: Dysarthric word salad speech worse; uses manual wheelchair but seen while resting in bed for afternoon nap     Labs reviewed: Recent Labs    09/29/18 0000 05/13/19 0000  NA 135* 141  K 4.3 4.4  CL  --  106  CO2  --  25*  BUN 31* 21  CREATININE 1.4* 1.3  CALCIUM  --  9.1   No results for input(s): AST, ALT, ALKPHOS, BILITOT, PROT, ALBUMIN in the last 8760 hours. Recent Labs    09/29/18 0000 10/01/18 0000  WBC 13.6 10.7  NEUTROABS 10  --   HGB 10.8* 11.4*  HCT 32* 32*  PLT 225 225    Lab Results  Component Value Date   TSH 1.67 03/05/2018   Lab Results  Component Value Date   HGBA1C 7.4 05/13/2019   Lab Results  Component Value Date   CHOL 152 06/27/2013   HDL 55 06/27/2013   LDLCALC 78 06/27/2013   TRIG 83 06/27/2013    Assessment/Plan 1. Mixed Alzheimer's and vascular dementia ([place]) -gradual decline persists with more speech difficulty and less verbal -continue comfort care and preventing skin breakdown with endit cream, positioning  2. Type 2 diabetes mellitus with stage 3b chronic kidney disease, with long-term current use of insulin (HCC) -cont levemir 20 units bid with bid cbgs at this point -hba1c great  3. Oropharyngeal dysphagia -dysphagia 2 ground diet, gets snack and whole milk also   4. Left hemiparesis ([place]) -longstanding from stroke, cont brace on left knee to prevent dislocation  5. Essential hypertension -bp satisfactory, cont same regimen with hold parameters to prevent hypotension, bradycardia  6. Bipolar 1 disorder, depressed, full remission ([place]) -cont longstanding anafranil and seroquel as is -his daughter has requested we leave these alone b/c he has setbacks when they are changed  Family/ staff Communication: discussed with snf nurse  Labs/tests ordered:  No new  Cherene Dobbins L. Katelynd Blauvelt, D.O. [facility] [address], [place] [address] Cell Phone (Mon-Fri 8am-5pm):  [phone] On Call:  [phone] & follow prompts after 5pm & weekends Office Phone:  [phone] Office Fax:  [phone]

## 2019-09-23 ENCOUNTER — Non-Acute Institutional Stay (SKILLED_NURSING_FACILITY): Payer: Medicare PPO | Admitting: Adult Health

## 2019-09-23 ENCOUNTER — Encounter: Payer: Self-pay | Admitting: Adult Health

## 2019-09-23 DIAGNOSIS — N1832 Chronic kidney disease, stage 3b: Secondary | ICD-10-CM

## 2019-09-23 DIAGNOSIS — I10 Essential (primary) hypertension: Secondary | ICD-10-CM

## 2019-09-23 DIAGNOSIS — R1312 Dysphagia, oropharyngeal phase: Secondary | ICD-10-CM | POA: Diagnosis not present

## 2019-09-23 DIAGNOSIS — Z794 Long term (current) use of insulin: Secondary | ICD-10-CM

## 2019-09-23 DIAGNOSIS — G309 Alzheimer's disease, unspecified: Secondary | ICD-10-CM

## 2019-09-23 DIAGNOSIS — E1121 Type 2 diabetes mellitus with diabetic nephropathy: Secondary | ICD-10-CM | POA: Diagnosis not present

## 2019-09-23 DIAGNOSIS — F0150 Vascular dementia without behavioral disturbance: Secondary | ICD-10-CM

## 2019-09-23 DIAGNOSIS — R29818 Other symptoms and signs involving the nervous system: Secondary | ICD-10-CM

## 2019-09-23 DIAGNOSIS — F0280 Dementia in other diseases classified elsewhere without behavioral disturbance: Secondary | ICD-10-CM

## 2019-09-23 NOTE — Progress Notes (Signed)
Location:  Occupational psychologist of Service:  SNF (31) Provider:   Cindi Carbon, ANP [place] [phone]   Gayland Curry, DO  Patient Care Team: Gayland Curry, DO as PCP - General (Geriatric Medicine) [facility], [name]  Extended Emergency Contact Information Primary Emergency Contact: [facility] Address: [address]          [place], [place] [address] [name] of [place] Phone: [phone] Mobile Phone: [phone] Relation: Daughter  Code Status:  DNR Goals of care: Advanced Directive information Advanced Directives 08/27/2019  Does Patient Have a Medical Advance Directive? Yes  Type of Advance Directive Out of facility DNR (pink MOST or yellow form)  Does patient want to make changes to medical advance directive? No - Guardian declined  Copy of [place] in Chart? -  Pre-existing out of facility DNR order (yellow form or pink MOST form) Pink MOST/Yellow Form most recent copy in chart - Physician notified to receive inpatient order     Chief Complaint  Patient presents with  . Medical Management of Chronic Issues    HPI:  Pt is a 86 y.o. male seen today for medical management of chronic diseases.    He has a resolving bruise on his left forearm of unknown origin. He does not have any pain or discomfort. He uses a hoyer lift for transfers and needs support for his left arm due to flaccidity associated with a prior CVA.  He has progressive vascular dementia. The nurses report that he is increasingly less verbal and more forgetful. He is not having any issues with behaviors. Mood is stable with not much variability. No reports of depression or anxiety. Weight is stable. Appetite fair. He is on a D2 diet ans periodically has issues with coughing at meals due to dysphagia. Had one episode of choking in Feb. Blood sugars 109-152 in the am and 190-277 in the evening.   Wt Readings from  Last 3 Encounters:  09/23/19 174 lb 14.4 oz (79.3 kg)  08/27/19 174 lb 12.8 oz (79.3 kg)  08/12/19 174 lb 12.8 oz (79.3 kg)      Past Medical History:  Diagnosis Date  . Anemia, iron deficiency 12/30/2012  . Basal cell carcinoma   . Bipolar 1 disorder ([place])   . CKD (chronic kidney disease), stage II 07/09/2012   07/16/12:   stage II chronic kidney disease by GFR.  Follow lab at interval    . Dementia ([place])   . Diabetes ([place])   . GI bleed   . Hypertension   . Mixed Alzheimer's and vascular dementia ([place]) 12/01/2012   06/07/12: STML re: CVA- contribute to debility/dependence. Pt. is aware of deficit. Will benefit from [place] environment for assistance, supervision, socialization    07/16/12:  Admission MDS reviewed: BIMS score 13/15,  functional status: Pt requires limited to extensive assist with all ADLs except eating (supervision)    . Pressure ulcer of sacrum 12/07/2012  . Squamous cell carcinoma of skin of scalp 01/24/14   anterior and posterior scalp   . Stroke [facility]) 08/2009   Left side with residual deficit   Past Surgical History:  Procedure Laterality Date  . HIP ARTHROPLASTY Left 12/01/2012   Procedure: ARTHROPLASTY BIPOLAR HIP;  Surgeon: Johnn Hai, MD;  Location: [facility];  Service: Orthopedics;  Laterality: Left;  . SKIN LESION EXCISION  01/24/14   Anterior and posterio scalp Squamous cell [name] PA  No Known Allergies  Outpatient Encounter Medications as of 09/23/2019  Medication Sig  . antiseptic oral rinse (BIOTENE) LIQD 15 mLs by Mouth Rinse route 4 (four) times daily.  Marland Kitchen aspirin 81 MG tablet Take 81 mg by mouth daily.  . cholecalciferol (VITAMIN D) 1000 UNITS tablet Take 1,000 Units by mouth every morning.  . clomiPRAMINE (ANAFRANIL) 25 MG capsule Take 1 capsule (25 mg total) by mouth at bedtime.  . donepezil (ARICEPT) 10 MG tablet Take 10 mg by mouth daily.  . Insulin Detemir (LEVEMIR FLEXTOUCH) 100 UNIT/ML Pen Inject 20 Units into the skin 2 (two)  times daily.   Marland Kitchen ketoconazole (NIZORAL) 2 % shampoo Apply 1 application topically 2 (two) times a week.  . latanoprost (XALATAN) 0.005 % ophthalmic solution 1 drop at bedtime.  . metoprolol tartrate (LOPRESSOR) 25 MG tablet Take 75 mg by mouth 2 (two) times daily. Hold for heart rate less than 60  . Multiple Vitamins-Minerals (CENTRUM SILVER 50+MEN) TABS Take by mouth daily.  . QUEtiapine (SEROQUEL) 25 MG tablet Take 0.5 tablets (12.5 mg total) by mouth at bedtime.  . tamsulosin (FLOMAX) 0.4 MG CAPS Take 0.4 mg by mouth daily after breakfast.   No facility-administered encounter medications on file as of 09/23/2019.    Review of Systems  Constitutional: Negative for activity change, appetite change, chills, diaphoresis, fatigue, fever and unexpected weight change.  Respiratory: Negative for cough, shortness of breath, wheezing and stridor.   Cardiovascular: Positive for leg swelling. Negative for chest pain and palpitations.  Gastrointestinal: Negative for abdominal distention, abdominal pain, constipation and diarrhea.  Genitourinary: Negative for difficulty urinating and dysuria.  Musculoskeletal: Positive for gait problem. Negative for arthralgias, back pain, joint swelling and myalgias.  Neurological: Positive for weakness. Negative for dizziness, seizures, syncope, facial asymmetry, speech difficulty and headaches.  Hematological: Negative for adenopathy. Does not bruise/bleed easily.  Psychiatric/Behavioral: Positive for confusion. Negative for agitation and behavioral problems.    Immunization History  Administered Date(s) Administered  . Influenza Inj Mdck Quad Pf 03/17/2016  . Influenza Whole 03/13/2013  . Influenza, High Dose Seasonal PF 03/14/2019  . Influenza,inj,Quad PF,6+ Mos 03/20/2018  . Influenza-Unspecified 03/04/2014, 03/12/2015, 03/23/2017  . Moderna SARS-COVID-2 Vaccination 06/04/2019, 07/02/2019  . PPD Test 07/05/2012  . Pneumococcal Conjugate-13 07/01/2016  .  Pneumococcal Polysaccharide-23 07/16/1996  . Td 07/01/2016  . Zoster Recombinat (Shingrix) 06/30/2017   Pertinent  Health Maintenance Due  Topic Date Due  . HEMOGLOBIN A1C  11/11/2019  . INFLUENZA VACCINE  12/29/2019  . OPHTHALMOLOGY EXAM  02/11/2020  . FOOT EXAM  08/11/2020  . PNA vac Low Risk Adult  Completed   Fall Risk  04/12/2019 07/12/2018 04/28/2018 10/03/2017 09/28/2016  Falls in the past year? - 1 Exclusion - non ambulatory No No  Number falls in past yr: - 0 - - -  Injury with Fall? - 1 - - -  Risk for fall due to : History of fall(s);Impaired balance/gait;Impaired mobility;Mental status change;Medication side effect History of fall(s);Impaired mobility;Impaired balance/gait;Mental status change Impaired mobility - -  Follow up Falls evaluation completed;Education provided;Falls prevention discussed Falls evaluation completed;Education provided;Falls prevention discussed Education provided - -  Comment at [place] [place] involved bathing apparatus and staff were reeducated  - - -   Functional Status Survey:    Vitals:   09/23/19 1108  Weight: 174 lb 14.4 oz (79.3 kg)   Body mass index is 24.39 kg/m. Physical Exam Vitals and nursing note reviewed.  Constitutional:      General: He  is not in acute distress.    Appearance: He is not diaphoretic.  HENT:     Head: Normocephalic and atraumatic.  Neck:     Thyroid: No thyromegaly.     Vascular: No JVD.     Trachea: No tracheal deviation.  Cardiovascular:     Rate and Rhythm: Normal rate and regular rhythm.     Heart sounds: No murmur.  Pulmonary:     Effort: Pulmonary effort is normal. No respiratory distress.     Breath sounds: Normal breath sounds. No wheezing.  Abdominal:     General: Bowel sounds are normal. There is no distension.     Palpations: Abdomen is soft.     Tenderness: There is no abdominal tenderness.  Musculoskeletal:     Comments: BLE trace edema  Lymphadenopathy:     Cervical: No cervical adenopathy.   Skin:    General: Skin is warm and dry.     Comments: Left forearm anterior ecchymosis resolving. No swelling or pain on range of motion to left wrist or elbow.   Neurological:     Mental Status: He is alert. Mental status is at baseline.     Comments: Oriented self and place. Confused about the details of his care. Has chronic left sided weakness   Psychiatric:        Mood and Affect: Mood normal.     Labs reviewed: Recent Labs    09/29/18 0000 05/13/19 0000  NA 135* 141  K 4.3 4.4  CL  --  106  CO2  --  25*  BUN 31* 21  CREATININE 1.4* 1.3  CALCIUM  --  9.1   No results for input(s): AST, ALT, ALKPHOS, BILITOT, PROT, ALBUMIN in the last 8760 hours. Recent Labs    09/29/18 0000 10/01/18 0000  WBC 13.6 10.7  NEUTROABS 10  --   HGB 10.8* 11.4*  HCT 32* 32*  PLT 225 225   Lab Results  Component Value Date   TSH 1.67 03/05/2018   Lab Results  Component Value Date   HGBA1C 7.4 05/13/2019   Lab Results  Component Value Date   CHOL 152 06/27/2013   HDL 55 06/27/2013   LDLCALC 78 06/27/2013   TRIG 83 06/27/2013    Significant Diagnostic Results in last 30 days:  No results found.  Assessment/Plan 1. Type 2 diabetes mellitus with stage 3b chronic kidney disease, with long-term current use of insulin (HCC) Controlled. Continue Levemir 20 units sq bid  Eye exam 02/11/19  2. Oropharyngeal dysphagia Continue D 2 diet  Asp prec, 1:1 supervision  3. Stage 3b chronic kidney disease Continue to periodically monitor BMP and avoid nephrotoxic agents Encourage hydration   4. Mixed Alzheimer's and vascular dementia ([place]) Progressive decline in cognition and physical function c/w the disease. Continue supportive care in the skilled environment.  5. Essential hypertension Controlled Continue Lopressor 75 mg bid   6. Extrapyramidal symptom Long term use of seroquel, not able to tolerate discontinuing.  This was not present on exam today but is a chronic problem.  Will monitor for worsening.     Family/ staff Communication: discussed with the nurse   Labs/tests ordered:  NA

## 2019-10-21 ENCOUNTER — Non-Acute Institutional Stay (SKILLED_NURSING_FACILITY): Payer: Medicare PPO | Admitting: Adult Health

## 2019-10-21 ENCOUNTER — Encounter: Payer: Self-pay | Admitting: Internal Medicine

## 2019-10-21 ENCOUNTER — Encounter: Payer: Self-pay | Admitting: Adult Health

## 2019-10-21 DIAGNOSIS — R1032 Left lower quadrant pain: Secondary | ICD-10-CM

## 2019-10-21 DIAGNOSIS — R410 Disorientation, unspecified: Secondary | ICD-10-CM | POA: Diagnosis not present

## 2019-10-21 LAB — CBC: RBC: 3.65 — AB (ref 3.87–5.11)

## 2019-10-21 LAB — BASIC METABOLIC PANEL
BUN: 28 — AB (ref 4–21)
CO2: 20 (ref 13–22)
Chloride: 107 (ref 99–108)
Creatinine: 1.5 — AB (ref 0.6–1.3)
Glucose: 74
Potassium: 4.1 (ref 3.4–5.3)
Sodium: 144 (ref 137–147)

## 2019-10-21 LAB — HEPATIC FUNCTION PANEL
ALT: 14 (ref 10–40)
AST: 22 (ref 14–40)

## 2019-10-21 LAB — COMPREHENSIVE METABOLIC PANEL
Albumin: 4 (ref 3.5–5.0)
Calcium: 10.4 (ref 8.7–10.7)
Globulin: 2.5

## 2019-10-21 LAB — CBC AND DIFFERENTIAL
HCT: 35 — AB (ref 41–53)
Hemoglobin: 12 — AB (ref 13.5–17.5)
Platelets: 238 (ref 150–399)
WBC: 10.5

## 2019-10-21 NOTE — Progress Notes (Signed)
Location:  Occupational psychologist of Service:  SNF (31) Provider:   Cindi Carbon, ANP [place] [phone]   George Curry, DO  Patient Care Team: George Curry, DO as PCP - General (Geriatric Medicine) Fay Records, Well [name]  Extended Emergency Contact Information Primary Emergency Contact: [facility] Address: [address] [place]          [place], [place] [address] [name] of [place] Phone: [phone] Mobile Phone: [phone] Relation: Daughter  Code Status:  DNR Goals of care: Advanced Directive information Advanced Directives 10/21/2019  Does Patient Have a Medical Advance Directive? Yes  Type of Advance Directive Living will;Healthcare Power of [place];Out of facility DNR (pink MOST or yellow form)  Does patient want to make changes to medical advance directive? -  Copy of [place] in Chart? Yes - validated most recent copy scanned in chart (See row information)  Pre-existing out of facility DNR order (yellow form or pink MOST form) Pink MOST form placed in chart (order not valid for inpatient use);Yellow form placed in chart (order not valid for inpatient use)     Chief Complaint  Patient presents with  . Acute Visit    confusion    HPI:  Pt is a 86 y.o. male seen today for an acute visit for confusion. George Kemp has a hx of CVA with left sided weakness, vascular dementia, BPH, anemia, CKD, DM II, HTN, GIB, dysphagia, and Bipolar. He resides in skilled care and requires a lift for transfers and assistance with all ADls. The nurse reports this morning he was babbling incoherently, much more talkative, and grabbing for something that was not there. Appeared to be hallucinating. His vitals were stable and he ate breakfast as usual. He resisted help today and was pushing the nurse away which is unusual for him. During my visit he seems to be grabbing towards his diaper and left side of the  abd. He reported abd pain to the LLQ but could not provide additional information. He did not have a fever, cough, sob, etc. CBG 125. According to the staff he is moving his bowels and urinating regularly.   Past Medical History:  Diagnosis Date  . Anemia, iron deficiency 12/30/2012  . Basal cell carcinoma   . Bipolar 1 disorder ([place])   . CKD (chronic kidney disease), stage II 07/09/2012   07/16/12:   stage II chronic kidney disease by GFR.  Follow lab at interval    . Dementia ([place])   . Diabetes ([place])   . GI bleed   . Hypertension   . Mixed Alzheimer's and vascular dementia ([place]) 12/01/2012   06/07/12: STML re: CVA- contribute to debility/dependence. Pt. is aware of deficit. Will benefit from [place] environment for assistance, supervision, socialization    07/16/12:  Admission MDS reviewed: BIMS score 13/15,  functional status: Pt requires limited to extensive assist with all ADLs except eating (supervision)    . Pressure ulcer of sacrum 12/07/2012  . Squamous cell carcinoma of skin of scalp 01/24/14   anterior and posterior scalp   . Stroke [facility]) 08/2009   Left side with residual deficit   Past Surgical History:  Procedure Laterality Date  . HIP ARTHROPLASTY Left 12/01/2012   Procedure: ARTHROPLASTY BIPOLAR HIP;  Surgeon: Johnn Hai, MD;  Location: [facility]S;  Service: Orthopedics;  Laterality: Left;  . SKIN LESION EXCISION  01/24/14   Anterior and posterio scalp Squamous cell [name] PA  No Known Allergies  Outpatient Encounter Medications as of 10/21/2019  Medication Sig  . antiseptic oral rinse (BIOTENE) LIQD 15 mLs by Mouth Rinse route 4 (four) times daily.  Marland Kitchen aspirin 81 MG tablet Take 81 mg by mouth daily.  . cholecalciferol (VITAMIN D) 1000 UNITS tablet Take 1,000 Units by mouth every morning.  . clomiPRAMINE (ANAFRANIL) 25 MG capsule Take 1 capsule (25 mg total) by mouth at bedtime.  . donepezil (ARICEPT) 10 MG tablet Take 10 mg by mouth daily.  . Insulin Detemir  (LEVEMIR FLEXTOUCH) 100 UNIT/ML Pen Inject 20 Units into the skin 2 (two) times daily.   Marland Kitchen ketoconazole (NIZORAL) 2 % shampoo Apply 1 application topically 2 (two) times a week.  . latanoprost (XALATAN) 0.005 % ophthalmic solution 1 drop at bedtime.  . metoprolol tartrate (LOPRESSOR) 25 MG tablet Take 75 mg by mouth 2 (two) times daily. Hold for heart rate less than 60  . Multiple Vitamins-Minerals (CENTRUM SILVER 50+MEN) TABS Take by mouth daily.  . QUEtiapine (SEROQUEL) 25 MG tablet Take 0.5 tablets (12.5 mg total) by mouth at bedtime.  . tamsulosin (FLOMAX) 0.4 MG CAPS Take 0.4 mg by mouth daily after breakfast.   No facility-administered encounter medications on file as of 10/21/2019.    Review of Systems  Unable to perform ROS: Acuity of condition    Immunization History  Administered Date(s) Administered  . Influenza Inj Mdck Quad Pf 03/17/2016  . Influenza Whole 03/13/2013  . Influenza, High Dose Seasonal PF 03/14/2019  . Influenza,inj,Quad PF,6+ Mos 03/20/2018  . Influenza-Unspecified 03/04/2014, 03/12/2015, 03/23/2017  . Moderna SARS-COVID-2 Vaccination 06/04/2019, 07/02/2019  . PPD Test 07/05/2012  . Pneumococcal Conjugate-13 07/01/2016  . Pneumococcal Polysaccharide-23 07/16/1996  . Td 07/01/2016  . Zoster Recombinat (Shingrix) 06/30/2017   Pertinent  Health Maintenance Due  Topic Date Due  . HEMOGLOBIN A1C  11/11/2019  . INFLUENZA VACCINE  12/29/2019  . OPHTHALMOLOGY EXAM  02/11/2020  . FOOT EXAM  08/11/2020  . PNA vac Low Risk Adult  Completed   Fall Risk  04/12/2019 07/12/2018 04/28/2018 10/03/2017 09/28/2016  Falls in the past year? - 1 Exclusion - non ambulatory No No  Number falls in past yr: - 0 - - -  Injury with Fall? - 1 - - -  Risk for fall due to : History of fall(s);Impaired balance/gait;Impaired mobility;Mental status change;Medication side effect History of fall(s);Impaired mobility;Impaired balance/gait;Mental status change Impaired mobility - -  Follow  up Falls evaluation completed;Education provided;Falls prevention discussed Falls evaluation completed;Education provided;Falls prevention discussed Education provided - -  Comment at [facility] involved bathing apparatus and staff were reeducated  - - -   Functional Status Survey:    Vitals:   10/21/19 1047  BP: (!) 156/60  Pulse: 60  Resp: (!) 26  Temp: (!) 97.5 F (36.4 C)  SpO2: 95%   There is no height or weight on file to calculate BMI. Physical Exam Vitals and nursing note reviewed.  Constitutional:      General: He is in acute distress.     Appearance: He is not diaphoretic.  HENT:     Head: Normocephalic and atraumatic.     Nose: Nose normal. No congestion.     Mouth/Throat:     Mouth: Mucous membranes are moist.     Pharynx: Oropharynx is clear.  Eyes:     Conjunctiva/sclera: Conjunctivae normal.     Pupils: Pupils are equal, round, and reactive to light.  Neck:     Thyroid: No  thyromegaly.     Vascular: No JVD.     Trachea: No tracheal deviation.  Cardiovascular:     Rate and Rhythm: Normal rate and regular rhythm.     Heart sounds: No murmur.  Pulmonary:     Effort: Pulmonary effort is normal. No respiratory distress.     Breath sounds: Normal breath sounds. No wheezing.  Abdominal:     General: There is distension (mild on the left side.).     Palpations: Abdomen is soft. There is no mass.     Tenderness: There is no abdominal tenderness. There is no right CVA tenderness, left CVA tenderness, guarding or rebound.     Comments: Hypoactive bowel sounds x 4  Musculoskeletal:     Cervical back: Normal range of motion.     Right lower leg: No edema.     Left lower leg: No edema.  Lymphadenopathy:     Cervical: No cervical adenopathy.  Skin:    General: Skin is warm and dry.     Comments: Erythema to bilateral buttocks with excoriation. Not tender and no drainage noted in the diaper   Neurological:     Mental Status: He is alert.     Cranial Nerves: No  cranial nerve deficit.     Comments: Oriented to self only. Not able to f/c for neuro exam. Moving right arm very well, left arm flaccid which is chronic. No obvious new cranial deficit  Psychiatric:     Comments: Agitated, grabbing at the air.      Labs reviewed: Recent Labs    05/13/19 0000  NA 141  K 4.4  CL 106  CO2 25*  BUN 21  CREATININE 1.3  CALCIUM 9.1   No results for input(s): AST, ALT, ALKPHOS, BILITOT, PROT, ALBUMIN in the last 8760 hours. No results for input(s): WBC, NEUTROABS, HGB, HCT, MCV, PLT in the last 8760 hours. Lab Results  Component Value Date   TSH 1.67 03/05/2018   Lab Results  Component Value Date   HGBA1C 7.4 05/13/2019   Lab Results  Component Value Date   CHOL 152 06/27/2013   HDL 55 06/27/2013   LDLCALC 78 06/27/2013   TRIG 83 06/27/2013    Significant Diagnostic Results in last 30 days:  No results found.  Assessment/Plan 1. Acute delirium Acute onset this morning Will check labs and urine due to LLQ pain No change in baseline function or worsening cranial nerve deficit. Goals of care are comfort based with no hospitalizations. IVF and antibiotics to be determined.   2. Left lower quadrant abdominal pain Mild distention noted. Supp given Will check UA C and S and drain the bladder to check for retention. No fever or tenderness on exam to indicate diverticulitis. Continue to monitor.      Family/ staff Communication: called and left a message with Tana Felts   Labs/tests ordered:  CBC CMP A1C UA C and S

## 2019-10-22 ENCOUNTER — Non-Acute Institutional Stay (SKILLED_NURSING_FACILITY): Payer: Medicare PPO | Admitting: Internal Medicine

## 2019-10-22 ENCOUNTER — Encounter: Payer: Self-pay | Admitting: Internal Medicine

## 2019-10-22 DIAGNOSIS — R1032 Left lower quadrant pain: Secondary | ICD-10-CM

## 2019-10-22 DIAGNOSIS — F0150 Vascular dementia without behavioral disturbance: Secondary | ICD-10-CM

## 2019-10-22 DIAGNOSIS — I10 Essential (primary) hypertension: Secondary | ICD-10-CM

## 2019-10-22 DIAGNOSIS — E1122 Type 2 diabetes mellitus with diabetic chronic kidney disease: Secondary | ICD-10-CM

## 2019-10-22 DIAGNOSIS — R410 Disorientation, unspecified: Secondary | ICD-10-CM | POA: Diagnosis not present

## 2019-10-22 DIAGNOSIS — F3176 Bipolar disorder, in full remission, most recent episode depressed: Secondary | ICD-10-CM | POA: Diagnosis not present

## 2019-10-22 DIAGNOSIS — G309 Alzheimer's disease, unspecified: Secondary | ICD-10-CM | POA: Diagnosis not present

## 2019-10-22 DIAGNOSIS — N1832 Chronic kidney disease, stage 3b: Secondary | ICD-10-CM

## 2019-10-22 DIAGNOSIS — F0280 Dementia in other diseases classified elsewhere without behavioral disturbance: Secondary | ICD-10-CM

## 2019-10-22 DIAGNOSIS — G8194 Hemiplegia, unspecified affecting left nondominant side: Secondary | ICD-10-CM

## 2019-10-22 DIAGNOSIS — E1121 Type 2 diabetes mellitus with diabetic nephropathy: Secondary | ICD-10-CM

## 2019-10-22 DIAGNOSIS — R1312 Dysphagia, oropharyngeal phase: Secondary | ICD-10-CM

## 2019-10-22 DIAGNOSIS — Z794 Long term (current) use of insulin: Secondary | ICD-10-CM

## 2019-10-22 LAB — HEMOGLOBIN A1C: Hemoglobin A1C: 7.4

## 2019-10-22 NOTE — Progress Notes (Signed)
Patient ID: George Kemp, male   DOB: [birth year], 86 y.o.   MRN: [id number]  Provider:  Rexene Edison. Mariea Clonts, D.O., C.M.D. Location: Artist of Service:  SNF (31)   PCP: Gayland Curry, DO Patient Care Team: Gayland Curry, DO as PCP - General (Geriatric Medicine) Community, Well [name]  Extended Emergency Contact Information Primary Emergency Contact: [facility] Address: [address]          [place],  [address] George Kemp of [place] Phone: [phone] Mobile Phone: [phone] Relation: Daughter  Code Status: DNR Goals of Care: Advanced Directive information Advanced Directives 10/21/2019  Does Patient Have a Medical Advance Directive? Yes  Type of Advance Directive Living will;Healthcare Power of [place];Out of facility DNR (pink MOST or yellow form)  Does patient want to make changes to medical advance directive? -  Copy of [place] in Chart? Yes - validated most recent copy scanned in chart (See row information)  Pre-existing out of facility DNR order (yellow form or pink MOST form) Pink MOST form placed in chart (order not valid for inpatient use);Yellow form placed in chart (order not valid for inpatient use)     Chief Complaint  Patient presents with  . Annual Exam    annual physical exam     HPI: Patient is a 86 y.o. male seen today for an annual comprehensive examination.  George Kemp has had a rough couple of days.  He was pushing his nurse away yesterday during care.  He also was agitated and yelling out at NP.  As NP and nursing were looking into his condition, we learned he'd been pushing the CNA away when she was feeding him recently.  He had indicated left lower quadrant pain with NP yesterday.  He has been afebrile though sats are mildly down and bp up a bit from baseline.  Today, he's doing better--ate most of his breakfast, 240cc fluids, and had a big bowel movement.  NP had ordered a set  of routine labs and UA c+s.  Goals are comfort-based.  His fungal rash on his buttocks has improved with clobetasol.   His last hba1c was 7.4 which is great for him especially at his advanced age, with dementia, living in long-term care (high risk for lows with variable intake).    When seen, he was resting in bed,  spoke with me less today and speech has been garbled the last few visits so challenging to understand.  He no longer grabs my hand and tries to pull me in like he once did.     Past Medical History:  Diagnosis Date  . Anemia, iron deficiency 12/30/2012  . Basal cell carcinoma   . Bipolar 1 disorder ([place])   . CKD (chronic kidney disease), stage II 07/09/2012   07/16/12:   stage II chronic kidney disease by GFR.  Follow lab at interval    . Dementia ([place])   . Diabetes ([place])   . GI bleed   . Hypertension   . Mixed Alzheimer's and vascular dementia ([place]) 12/01/2012   06/07/12: STML re: CVA- contribute to debility/dependence. Pt. is aware of deficit. Will benefit from [place] environment for assistance, supervision, socialization    07/16/12:  Admission MDS reviewed: BIMS score 13/15,  functional status: Pt requires limited to extensive assist with all ADLs except eating (supervision)    . Pressure ulcer of sacrum 12/07/2012  . Squamous cell carcinoma of skin of scalp 01/24/14  anterior and posterior scalp   . Stroke [facility]) 08/2009   Left side with residual deficit   Past Surgical History:  Procedure Laterality Date  . HIP ARTHROPLASTY Left 12/01/2012   Procedure: ARTHROPLASTY BIPOLAR HIP;  Surgeon: Johnn Hai, MD;  Location: [facility]S;  Service: Orthopedics;  Laterality: Left;  . SKIN LESION EXCISION  01/24/14   Anterior and posterio scalp Squamous cell [name] PA    reports that he has quit smoking. He smoked 1.00 pack per day. He has never used smokeless tobacco. He reports that he does not drink alcohol or use drugs. Social History   Socioeconomic History  . Marital  status: Widowed    Spouse name: Not on file  . Number of children: Not on file  . Years of education: Not on file  . Highest education level: Not on file  Occupational History  . Occupation: retired Engineer, maintenance (IT)  Tobacco Use  . Smoking status: Former Smoker    Packs/day: 1.00  . Smokeless tobacco: Never Used  Substance and Sexual Activity  . Alcohol use: No  . Drug use: No  . Sexual activity: Never  Other Topics Concern  . Not on file  Social History Narrative   His wife passed away in 27-Aug-2015. Resides at [facility], Skilled nursing section since 05/2012.  He is a retired Engineer, maintenance (IT). Stopped smoking many years ago, drinks minimal alcohol. Pt. Has Advanced Directives: DNR.   Social Determinants of Health   Financial Resource Strain:   . Difficulty of Paying Living Expenses:   Food Insecurity:   . Worried About Charity fundraiser in the Last Year:   . Arboriculturist in the Last Year:   Transportation Needs:   . Film/video editor (Medical):   Marland Kitchen Lack of Transportation (Non-Medical):   Physical Activity:   . Days of Exercise per Week:   . Minutes of Exercise per Session:   Stress:   . Feeling of Stress :   Social Connections:   . Frequency of Communication with Friends and Family:   . Frequency of Social Gatherings with Friends and Family:   . Attends Religious Services:   . Active Member of Clubs or Organizations:   . Attends Archivist Meetings:   Marland Kitchen Marital Status:    Family History  Problem Relation Age of Onset  . Stroke Mother   . Congestive Heart Failure Mother   . Cancer - Other Father        Unknown  Type  . Cancer Father   . Stroke Brother         X 2  . Diabetes Brother   . Thyroid disease Daughter     Pertinent  Health Maintenance Due  Topic Date Due  . HEMOGLOBIN A1C  11/11/2019  . INFLUENZA VACCINE  12/29/2019  . OPHTHALMOLOGY EXAM  02/11/2020  . FOOT EXAM  08/11/2020  . PNA vac Low Risk Adult  Completed   Fall Risk  04/12/2019  07/12/2018 04/28/2018 10/03/2017 09/28/2016  Falls in the past year? - 1 Exclusion - non ambulatory No No  Number falls in past yr: - 0 - - -  Injury with Fall? - 1 - - -  Risk for fall due to : History of fall(s);Impaired balance/gait;Impaired mobility;Mental status change;Medication side effect History of fall(s);Impaired mobility;Impaired balance/gait;Mental status change Impaired mobility - -  Follow up Falls evaluation completed;Education provided;Falls prevention discussed Falls evaluation completed;Education provided;Falls prevention discussed Education provided - -  Comment at [employer] involved bathing apparatus and staff were reeducated  - - -   Depression screen [facility] 2/9 04/28/2018 04/28/2018 10/03/2017 09/28/2016 05/29/2015  Decreased Interest 0 0 0 0 0  Down, Depressed, Hopeless 0 0 0 0 0  PHQ - 2 Score 0 0 0 0 0    Functional Status Survey:    No Known Allergies  Outpatient Encounter Medications as of 10/22/2019  Medication Sig  . antiseptic oral rinse (BIOTENE) LIQD 15 mLs by Mouth Rinse route 4 (four) times daily.  Marland Kitchen aspirin 81 MG tablet Take 81 mg by mouth daily.  . cholecalciferol (VITAMIN D) 1000 UNITS tablet Take 1,000 Units by mouth every morning.  . clomiPRAMINE (ANAFRANIL) 25 MG capsule Take 1 capsule (25 mg total) by mouth at bedtime.  . donepezil (ARICEPT) 10 MG tablet Take 10 mg by mouth daily.  . Insulin Detemir (LEVEMIR FLEXTOUCH) 100 UNIT/ML Pen Inject 20 Units into the skin 2 (two) times daily.   Marland Kitchen ketoconazole (NIZORAL) 2 % shampoo Apply 1 application topically 2 (two) times a week.  . latanoprost (XALATAN) 0.005 % ophthalmic solution 1 drop at bedtime.  . metoprolol tartrate (LOPRESSOR) 25 MG tablet Take 75 mg by mouth 2 (two) times daily. Hold for heart rate less than 60  . Multiple Vitamins-Minerals (CENTRUM SILVER 50+MEN) TABS Take by mouth daily.  . QUEtiapine (SEROQUEL) 25 MG tablet Take 0.5 tablets (12.5 mg total) by mouth at bedtime.  . tamsulosin  (FLOMAX) 0.4 MG CAPS Take 0.4 mg by mouth daily after breakfast.   No facility-administered encounter medications on file as of 10/22/2019.    Review of Systems  Constitutional: Positive for fatigue. Negative for activity change and fever.  HENT: Positive for trouble swallowing. Negative for congestion.   Respiratory: Negative for chest tightness and shortness of breath.   Gastrointestinal: Negative for abdominal pain, constipation, diarrhea, nausea and vomiting.  Genitourinary: Negative for dysuria.       Incontinence  Musculoskeletal: Positive for gait problem.       Uses wheelchair since stroke several years ago with hemiparesis  Skin: Negative for color change.  Neurological: Positive for speech difficulty and weakness.  Psychiatric/Behavioral: Positive for agitation and confusion. Negative for sleep disturbance.    There were no vitals filed for this visit. There is no height or weight on file to calculate BMI. Physical Exam Vitals and nursing note reviewed.  Constitutional:      General: He is not in acute distress.    Appearance: He is not toxic-appearing.  HENT:     Head: Normocephalic and atraumatic.     Right Ear: External ear normal.     Left Ear: External ear normal.     Mouth/Throat:     Pharynx: Oropharynx is clear.  Eyes:     Conjunctiva/sclera: Conjunctivae normal.     Pupils: Pupils are equal, round, and reactive to light.  Cardiovascular:     Rate and Rhythm: Normal rate and regular rhythm.     Pulses: Normal pulses.     Heart sounds: Normal heart sounds.  Pulmonary:     Effort: Pulmonary effort is normal.     Breath sounds: No wheezing, rhonchi or rales.  Abdominal:     General: Bowel sounds are normal.     Palpations: Abdomen is soft.     Tenderness: There is no abdominal tenderness. There is no guarding or rebound.  Musculoskeletal:     Right lower leg: No edema.     Left  lower leg: No edema.     Comments: Chronic left hemiparesis  Skin:     General: Skin is warm and dry.  Neurological:     Mental Status: He is alert. Mental status is at baseline.     Motor: Weakness present.     Comments: Hemiparesis, resting in bed, dysarthric speech     Labs reviewed: Basic Metabolic Panel: Recent Labs    05/13/19 0000  NA 141  K 4.4  CL 106  CO2 25*  BUN 21  CREATININE 1.3  CALCIUM 9.1   Liver Function Tests: No results for input(s): AST, ALT, ALKPHOS, BILITOT, PROT, ALBUMIN in the last 8760 hours. No results for input(s): LIPASE, AMYLASE in the last 8760 hours. No results for input(s): AMMONIA in the last 8760 hours. CBC: No results for input(s): WBC, NEUTROABS, HGB, HCT, MCV, PLT in the last 8760 hours. Cardiac Enzymes: No results for input(s): CKTOTAL, CKMB, CKMBINDEX, TROPONINI in the last 8760 hours. BNP: Invalid input(s): POCBNP Lab Results  Component Value Date   HGBA1C 7.4 05/13/2019   Lab Results  Component Value Date   TSH 1.67 03/05/2018   No results found for: VITAMINB12 No results found for: FOLATE No results found for: IRON, TIBC, FERRITIN  Imaging and Procedures obtained recently: No results found.  Assessment/Plan 1. Acute delirium -this has either resolved or I'm just seeing the improved portion of the waxing/waning -initial UA negative and labs unrevealing -CBG ok -suspect perhaps 1 and 2 due to need for the large bm he had  2. Left lower quadrant abdominal pain -resolved  3. Mixed Alzheimer's and vascular dementia ([place]) -is progressing, he's requiring assistance even with feeding himself now, less verbal and harder to understand, less interactive with staff -remains on aricept though benefit weaning for him   4. Bipolar 1 disorder, depressed, full remission (HCC) -cont chronic regimen with anafranil and seroquel   5. Oropharyngeal dysphagia -cont modified diet, aspiration precautions  6. Type 2 diabetes mellitus with stage 3b chronic kidney disease, with long-term current use of  insulin ([place]) -well-controlled based on geriatric guidelines for diabetes -cont same regimen--not having lows  7. Essential hypertension -bp elevated yesterday and today--monitor and if remains the case upon reviwe next month, adjustment may be needed   8. Left hemiparesis ([place]) -from prior stroke, requires ongoing snf care with hoyer life and full adl assist  Family/ staff Communication: discussed with snf nurse, CNA  Labs/tests ordered:  No new added  Lister Brizzi L. Adeana Grilliot, D.O. [facility] [address], [place] [address] Cell Phone (Mon-Fri 8am-5pm):  [phone] On Call:  [phone] & follow prompts after 5pm & weekends Office Phone:  [phone] Office Fax:  [phone]

## 2019-11-01 ENCOUNTER — Encounter: Payer: Self-pay | Admitting: Internal Medicine

## 2019-11-15 ENCOUNTER — Encounter: Payer: Self-pay | Admitting: Adult Health

## 2019-11-15 ENCOUNTER — Non-Acute Institutional Stay (SKILLED_NURSING_FACILITY): Payer: Medicare PPO | Admitting: Adult Health

## 2019-11-15 DIAGNOSIS — R1312 Dysphagia, oropharyngeal phase: Secondary | ICD-10-CM | POA: Diagnosis not present

## 2019-11-15 DIAGNOSIS — G309 Alzheimer's disease, unspecified: Secondary | ICD-10-CM

## 2019-11-15 DIAGNOSIS — N1832 Chronic kidney disease, stage 3b: Secondary | ICD-10-CM | POA: Diagnosis not present

## 2019-11-15 DIAGNOSIS — F0150 Vascular dementia without behavioral disturbance: Secondary | ICD-10-CM

## 2019-11-15 DIAGNOSIS — E1121 Type 2 diabetes mellitus with diabetic nephropathy: Secondary | ICD-10-CM

## 2019-11-15 DIAGNOSIS — G8194 Hemiplegia, unspecified affecting left nondominant side: Secondary | ICD-10-CM | POA: Diagnosis not present

## 2019-11-15 DIAGNOSIS — F0280 Dementia in other diseases classified elsewhere without behavioral disturbance: Secondary | ICD-10-CM

## 2019-11-15 DIAGNOSIS — Z794 Long term (current) use of insulin: Secondary | ICD-10-CM

## 2019-11-15 NOTE — Progress Notes (Signed)
Location:  Occupational psychologist of Service:  SNF (31) Provider:   Cindi Carbon, ANP [place] [phone]   Gayland Curry, DO  Patient Care Team: Gayland Curry, DO as PCP - General (Geriatric Medicine) Fay Records, Well [name]  Extended Emergency Contact Information Primary Emergency Contact: [facility] Address: [address]          [place], [place] [address] [name] of [place] Phone: [phone] Mobile Phone: [phone] Relation: Daughter  Code Status:  DNR Goals of care: Advanced Directive information Advanced Directives 10/22/2019  Does Patient Have a Medical Advance Directive? Yes  Type of Advance Directive Out of facility DNR (pink MOST or yellow form)  Does patient want to make changes to medical advance directive? No - Patient declined  Copy of [place] in Chart? -  Pre-existing out of facility DNR order (yellow form or pink MOST form) -     Chief Complaint  Patient presents with  . Medical Management of Chronic Issues    HPI:  Pt is a 86 y.o. male seen today for medical management of chronic diseases.    CVA with left sided weakness: He is now using a tray table to the left arm with strap to prevent subluxation and discomfort. He reports that he likes it He is a [employer] and needs assistance with all ADLs, however, he is able to feed himself. He has dysarthria and dysphagia. He is on a D2 diet and there are no reports of coughing or choking. MMSE in Feb was 9/30 with failed clock.  CBGS range 93-125 in the am and 200-300 in the evening.    Past Medical History:  Diagnosis Date  . Anemia, iron deficiency 12/30/2012  . Basal cell carcinoma   . Bipolar 1 disorder ([place])   . CKD (chronic kidney disease), stage II 07/09/2012   07/16/12:   stage II chronic kidney disease by GFR.  Follow lab at interval    . Dementia ([place])   . Diabetes ([place])   . GI bleed   . Hypertension   .  Mixed Alzheimer's and vascular dementia ([place]) 12/01/2012   06/07/12: STML re: CVA- contribute to debility/dependence. Pt. is aware of deficit. Will benefit from [facility] environment for assistance, supervision, socialization    07/16/12:  Admission MDS reviewed: BIMS score 13/15,  functional status: Pt requires limited to extensive assist with all ADLs except eating (supervision)    . Pressure ulcer of sacrum 12/07/2012  . Squamous cell carcinoma of skin of scalp 01/24/14   anterior and posterior scalp   . Stroke [facility]) 08/2009   Left side with residual deficit   Past Surgical History:  Procedure Laterality Date  . HIP ARTHROPLASTY Left 12/01/2012   Procedure: ARTHROPLASTY BIPOLAR HIP;  Surgeon: Johnn Hai, MD;  Location: [facility]S;  Service: Orthopedics;  Laterality: Left;  . SKIN LESION EXCISION  01/24/14   Anterior and posterio scalp Squamous cell [name] PA    No Known Allergies  Outpatient Encounter Medications as of 11/15/2019  Medication Sig  . carboxymethylcellulose (REFRESH PLUS) 0.5 % SOLN 2 drops 2 (two) times daily as needed.  Marland Kitchen antiseptic oral rinse (BIOTENE) LIQD 15 mLs by Mouth Rinse route 4 (four) times daily.  Marland Kitchen aspirin 81 MG tablet Take 81 mg by mouth daily.  . cholecalciferol (VITAMIN D) 1000 UNITS tablet Take 1,000 Units by mouth every morning.  . clomiPRAMINE (ANAFRANIL) 25 MG capsule Take  1 capsule (25 mg total) by mouth at bedtime.  . donepezil (ARICEPT) 10 MG tablet Take 10 mg by mouth daily.  . Insulin Detemir (LEVEMIR FLEXTOUCH) 100 UNIT/ML Pen Inject 20 Units into the skin 2 (two) times daily.   Marland Kitchen ketoconazole (NIZORAL) 2 % shampoo Apply 1 application topically 2 (two) times a week.  . latanoprost (XALATAN) 0.005 % ophthalmic solution 1 drop at bedtime.  . metoprolol tartrate (LOPRESSOR) 25 MG tablet Take 75 mg by mouth 2 (two) times daily. Hold for heart rate less than 60  . Multiple Vitamins-Minerals (CENTRUM SILVER 50+MEN) TABS Take by mouth daily.  .  QUEtiapine (SEROQUEL) 25 MG tablet Take 0.5 tablets (12.5 mg total) by mouth at bedtime.  . tamsulosin (FLOMAX) 0.4 MG CAPS Take 0.4 mg by mouth daily after breakfast.   No facility-administered encounter medications on file as of 11/15/2019.    Review of Systems  Constitutional: Negative for activity change, appetite change, chills, diaphoresis, fatigue, fever and unexpected weight change.  Respiratory: Negative for cough, shortness of breath, wheezing and stridor.   Cardiovascular: Positive for leg swelling. Negative for chest pain and palpitations.  Gastrointestinal: Negative for abdominal distention, abdominal pain, constipation and diarrhea.  Genitourinary: Negative for difficulty urinating and dysuria.  Musculoskeletal: Positive for gait problem. Negative for arthralgias, back pain, joint swelling and myalgias.  Neurological: Positive for weakness. Negative for dizziness, seizures, syncope, facial asymmetry, speech difficulty and headaches.  Hematological: Negative for adenopathy. Does not bruise/bleed easily.  Psychiatric/Behavioral: Positive for confusion. Negative for agitation and behavioral problems.    Immunization History  Administered Date(s) Administered  . Influenza Inj Mdck Quad Pf 03/17/2016  . Influenza Whole 03/13/2013  . Influenza, High Dose Seasonal PF 03/14/2019  . Influenza,inj,Quad PF,6+ Mos 03/20/2018  . Influenza-Unspecified 03/04/2014, 03/12/2015, 03/23/2017  . Moderna SARS-COVID-2 Vaccination 06/04/2019, 07/02/2019  . PPD Test 07/05/2012  . Pneumococcal Conjugate-13 07/01/2016  . Pneumococcal Polysaccharide-23 07/16/1996  . Td 07/01/2016  . Zoster Recombinat (Shingrix) 06/30/2017   Pertinent  Health Maintenance Due  Topic Date Due  . INFLUENZA VACCINE  12/29/2019  . OPHTHALMOLOGY EXAM  02/11/2020  . HEMOGLOBIN A1C  04/23/2020  . FOOT EXAM  08/11/2020  . PNA vac Low Risk Adult  Completed   Fall Risk  04/12/2019 07/12/2018 04/28/2018 10/03/2017 09/28/2016    Falls in the past year? - 1 Exclusion - non ambulatory No No  Number falls in past yr: - 0 - - -  Injury with Fall? - 1 - - -  Risk for fall due to : History of fall(s);Impaired balance/gait;Impaired mobility;Mental status change;Medication side effect History of fall(s);Impaired mobility;Impaired balance/gait;Mental status change Impaired mobility - -  Follow up Falls evaluation completed;Education provided;Falls prevention discussed Falls evaluation completed;Education provided;Falls prevention discussed Education provided - -  Comment at [facility] involved bathing apparatus and staff were reeducated  - - -   Functional Status Survey:    Vitals:   11/15/19 1148  Weight: 178 lb 12.8 oz (81.1 kg)   Body mass index is 24.94 kg/m. Physical Exam Vitals and nursing note reviewed.  Constitutional:      General: He is not in acute distress.    Appearance: He is not diaphoretic.  HENT:     Head: Normocephalic and atraumatic.  Neck:     Thyroid: No thyromegaly.     Vascular: No JVD.     Trachea: No tracheal deviation.  Cardiovascular:     Rate and Rhythm: Normal rate and regular rhythm.  Heart sounds: No murmur heard.   Pulmonary:     Effort: Pulmonary effort is normal. No respiratory distress.     Breath sounds: Normal breath sounds. No wheezing.  Abdominal:     General: Bowel sounds are normal. There is no distension.     Palpations: Abdomen is soft.     Tenderness: There is no abdominal tenderness.  Musculoskeletal:     Comments: BLE trace edema  Lymphadenopathy:     Cervical: No cervical adenopathy.  Skin:    General: Skin is warm and dry.  Neurological:     Mental Status: He is alert. Mental status is at baseline.     Comments: Oriented self and place. Confused about the details of his care. Has chronic left sided weakness   Psychiatric:        Mood and Affect: Mood normal.     Labs reviewed: Recent Labs    05/13/19 0000 10/21/19 0300  NA 141 144  K 4.4  4.1  CL 106 107  CO2 25* 20  BUN 21 28*  CREATININE 1.3 1.5*  CALCIUM 9.1 10.4   Recent Labs    10/21/19 0300  AST 22  ALT 14  ALBUMIN 4.0   Recent Labs    10/21/19 0300  WBC 10.5  HGB 12.0*  HCT 35*  PLT 238   Lab Results  Component Value Date   TSH 1.67 03/05/2018   Lab Results  Component Value Date   HGBA1C 7.4 10/22/2019   Lab Results  Component Value Date   CHOL 152 06/27/2013   HDL 55 06/27/2013   LDLCALC 78 06/27/2013   TRIG 83 06/27/2013    Significant Diagnostic Results in last 30 days:  No results found.  Assessment/Plan  1. Type 2 diabetes mellitus with stage 3b chronic kidney disease, with long-term current use of insulin (HCC) Controlled Continue Levemir 20 units twice daily. Goal A1C <8%  2. Stage 3b chronic kidney disease Continue to periodically monitor BMP and avoid nephrotoxic agents  3. Left hemiparesis ([place]) Due to prior CVA, now using a tray table with strap for additional support which seems to help  4. Oropharyngeal dysphagia Continue D2 diet with thin liquids, asp prec in place  5. Mixed Alzheimer's and vascular dementia ([place]) Delirium has resolved from last month.  Progressing over the past year.  Continue supportive environment in the skilled care setting  Continue Aricept 10 mg qd.     Family/ staff Communication: discussed with the nurse   Labs/tests ordered:  NA

## 2019-12-17 ENCOUNTER — Non-Acute Institutional Stay (SKILLED_NURSING_FACILITY): Payer: Medicare PPO | Admitting: Internal Medicine

## 2019-12-17 ENCOUNTER — Encounter: Payer: Self-pay | Admitting: Internal Medicine

## 2019-12-17 DIAGNOSIS — F0280 Dementia in other diseases classified elsewhere without behavioral disturbance: Secondary | ICD-10-CM

## 2019-12-17 DIAGNOSIS — R410 Disorientation, unspecified: Secondary | ICD-10-CM | POA: Diagnosis not present

## 2019-12-17 DIAGNOSIS — G8194 Hemiplegia, unspecified affecting left nondominant side: Secondary | ICD-10-CM | POA: Diagnosis not present

## 2019-12-17 DIAGNOSIS — F3176 Bipolar disorder, in full remission, most recent episode depressed: Secondary | ICD-10-CM | POA: Diagnosis not present

## 2019-12-17 DIAGNOSIS — E1122 Type 2 diabetes mellitus with diabetic chronic kidney disease: Secondary | ICD-10-CM

## 2019-12-17 DIAGNOSIS — R1312 Dysphagia, oropharyngeal phase: Secondary | ICD-10-CM

## 2019-12-17 DIAGNOSIS — N1832 Chronic kidney disease, stage 3b: Secondary | ICD-10-CM

## 2019-12-17 DIAGNOSIS — E1121 Type 2 diabetes mellitus with diabetic nephropathy: Secondary | ICD-10-CM

## 2019-12-17 DIAGNOSIS — F0150 Vascular dementia without behavioral disturbance: Secondary | ICD-10-CM

## 2019-12-17 DIAGNOSIS — Z794 Long term (current) use of insulin: Secondary | ICD-10-CM

## 2019-12-17 DIAGNOSIS — G309 Alzheimer's disease, unspecified: Secondary | ICD-10-CM

## 2019-12-17 NOTE — Progress Notes (Signed)
Location:  [facility] Room Number: 782 Place of Service:  SNF (31) Provider:  Lannie Yusuf L. Mariea Clonts, D.O., C.M.D.  Gayland Curry, DO  Patient Care Team: Gayland Curry, DO as PCP - General (Geriatric Medicine) Fay Records, Well [name]  Extended Emergency Contact Information Primary Emergency Contact: [facility] Address: [address]          [place], [place] [address] [name] of [place] Phone: [phone] Mobile Phone: [phone] Relation: Daughter  Code Status:  DNR, MOST Goals of care: Advanced Directive information Advanced Directives 12/17/2019  Does Patient Have a Medical Advance Directive? Yes  Type of Advance Directive Out of facility DNR (pink MOST or yellow form)  Does patient want to make changes to medical advance directive? No - Patient declined  Copy of [place] in Chart? -  Pre-existing out of facility DNR order (yellow form or pink MOST form) Pink MOST/Yellow Form most recent copy in chart - Physician notified to receive inpatient order     Chief Complaint  Patient presents with  . Acute Visit    Plan of Care meeting with daughter     HPI:  Pt is a 86 y.o. male seen today for an acute visit to update his daughter about his recent changes.   Waylen has been here for long-term care since his stroke with left hemiparesis.  He has vascular dementia mixed with AD.  He's got diabetes.  Recently, he has had a decline.  He is on a ground diet due to dysphagia.   He's had some spells of decreased responsiveness and concern for possible TIAs vs delirium.  5/24, he was babbling incoherently, grabbing at items that were not there and pushing the nurse away from him when he has generally been quite receptive to his care needs.  There was question if he had some abdominal pain and constipation.  Labs and UA c+s were ordered and ruled out urinary retention.   Labs were unremarkable and hba1c was 7.4  which was great for a 86 yo long-term care resident who gets some pleasure in life from sweet items/candy. By the following day, he was a bit more alert, but not fully back to baseline.  He had a large bm and was eating well again.  He's struggled also with fungal rash on his buttocks that had led to skin breakdown.  At this time, it's much improved per wound care nursing and when I saw him and his daughter late in the afternoon, they were taking a stroll with him sitting upright in his wheelchair.  He was smiling and spoke a few words.  We discussed how he will have good and bad days along his trajectory and today was a good day.  Webb Silversmith mentioned that he has not figured out about putting his right foot down to self-propel since he'd not been getting out of bed or moving around much for a while.  He no longer appears uncomfortable in his chair like he'd been.  She expressed understanding of his decline and waxing and waning.    Past Medical History:  Diagnosis Date  . Anemia, iron deficiency 12/30/2012  . Basal cell carcinoma   . Bipolar 1 disorder ([place])   . CKD (chronic kidney disease), stage II 07/09/2012   07/16/12:   stage II chronic kidney disease by GFR.  Follow lab at interval    . Dementia ([place])   . Diabetes ([place])   . GI bleed   .  Hypertension   . Mixed Alzheimer's and vascular dementia ([place]) 12/01/2012   06/07/12: STML re: CVA- contribute to debility/dependence. Pt. is aware of deficit. Will benefit from [facility] environment for assistance, supervision, socialization    07/16/12:  Admission MDS reviewed: BIMS score 13/15,  functional status: Pt requires limited to extensive assist with all ADLs except eating (supervision)    . Pressure ulcer of sacrum 12/07/2012  . Squamous cell carcinoma of skin of scalp 01/24/14   anterior and posterior scalp   . Stroke [facility]) 08/2009   Left side with residual deficit   Past Surgical History:  Procedure Laterality Date  . HIP ARTHROPLASTY Left 12/01/2012     Procedure: ARTHROPLASTY BIPOLAR HIP;  Surgeon: Johnn Hai, MD;  Location: [facility]S;  Service: Orthopedics;  Laterality: Left;  . SKIN LESION EXCISION  01/24/14   Anterior and posterio scalp Squamous cell [name] PA    No Known Allergies  Outpatient Encounter Medications as of 12/17/2019  Medication Sig  . acetaminophen (TYLENOL) 500 MG tablet Take 500 mg by mouth every 6 (six) hours as needed.  Marland Kitchen antiseptic oral rinse (BIOTENE) LIQD 15 mLs by Mouth Rinse route 4 (four) times daily.  Marland Kitchen aspirin 81 MG tablet Take 81 mg by mouth daily.  . carboxymethylcellulose (REFRESH PLUS) 0.5 % SOLN 2 drops 2 (two) times daily as needed.  . cholecalciferol (VITAMIN D) 1000 UNITS tablet Take 1,000 Units by mouth every morning.  . clomiPRAMINE (ANAFRANIL) 25 MG capsule Take 1 capsule (25 mg total) by mouth at bedtime.  . collagenase (SANTYL) ointment Apply 1 application topically daily.  Marland Kitchen donepezil (ARICEPT) 10 MG tablet Take 10 mg by mouth daily.  . Insulin Detemir (LEVEMIR FLEXTOUCH) 100 UNIT/ML Pen Inject 20 Units into the skin 2 (two) times daily.   Marland Kitchen ketoconazole (NIZORAL) 2 % shampoo Apply 1 application topically 2 (two) times a week.  . latanoprost (XALATAN) 0.005 % ophthalmic solution 1 drop at bedtime.  . metoprolol tartrate (LOPRESSOR) 25 MG tablet Take 75 mg by mouth 2 (two) times daily. Hold for heart rate less than 60  . Multiple Vitamins-Minerals (CENTRUM SILVER 50+MEN) TABS Take by mouth daily.  . QUEtiapine (SEROQUEL) 25 MG tablet Take 0.5 tablets (12.5 mg total) by mouth at bedtime.  . tamsulosin (FLOMAX) 0.4 MG CAPS Take 0.4 mg by mouth daily after breakfast.   No facility-administered encounter medications on file as of 12/17/2019.    Review of Systems  Constitutional: Positive for malaise/fatigue. Negative for chills and fever.  HENT: Positive for hearing loss. Negative for congestion.   Eyes:       Glasses  Respiratory: Negative for shortness of breath.        Some cough  with meals with dysphagia  Cardiovascular: Negative for chest pain and palpitations.  Gastrointestinal: Positive for constipation. Negative for abdominal pain.       Managed with medications  Genitourinary: Negative for dysuria.  Musculoskeletal: Negative for falls.  Neurological:       Left hemiparesis  Psychiatric/Behavioral: Positive for memory loss.       Bipolar disorder stable on meds    Immunization History  Administered Date(s) Administered  . Influenza Inj Mdck Quad Pf 03/17/2016  . Influenza Whole 03/13/2013  . Influenza, High Dose Seasonal PF 03/14/2019  . Influenza,inj,Quad PF,6+ Mos 03/20/2018  . Influenza-Unspecified 03/04/2014, 03/12/2015, 03/23/2017  . Moderna SARS-COVID-2 Vaccination 06/04/2019, 07/02/2019  . PPD Test 07/05/2012  . Pneumococcal Conjugate-13 07/01/2016  . Pneumococcal Polysaccharide-23 07/16/1996  .  Td 07/01/2016  . Zoster Recombinat (Shingrix) 06/30/2017   Pertinent  Health Maintenance Due  Topic Date Due  . INFLUENZA VACCINE  12/29/2019  . OPHTHALMOLOGY EXAM  02/11/2020  . HEMOGLOBIN A1C  04/23/2020  . FOOT EXAM  08/11/2020  . PNA vac Low Risk Adult  Completed   Fall Risk  04/12/2019 07/12/2018 04/28/2018 10/03/2017 09/28/2016  Falls in the past year? - 1 Exclusion - non ambulatory No No  Number falls in past yr: - 0 - - -  Injury with Fall? - 1 - - -  Risk for fall due to : History of fall(s);Impaired balance/gait;Impaired mobility;Mental status change;Medication side effect History of fall(s);Impaired mobility;Impaired balance/gait;Mental status change Impaired mobility - -  Follow up Falls evaluation completed;Education provided;Falls prevention discussed Falls evaluation completed;Education provided;Falls prevention discussed Education provided - -  Comment at [place] involved bathing apparatus and staff were reeducated  - - -   Functional Status Survey:    Vitals:   12/17/19 1621  BP: 126/71  Pulse: 97  Temp: 97.7 F (36.5 C)    SpO2: 93%  Weight: 178 lb 12.8 oz (81.1 kg)  Height: 5\' 11"  (1.803 m)   Body mass index is 24.94 kg/m. Physical Exam  Labs reviewed: Recent Labs    05/13/19 0000 10/21/19 0000 10/21/19 0300  NA 141  --  144  K 4.4  --  4.1  CL 106  --  107  CO2 25*  --  20  BUN 21  --  28*  CREATININE 1.3  --  1.5*  CALCIUM 9.1 10.4  --    Recent Labs    10/21/19 0000 10/21/19 0300  AST  --  22  ALT  --  14  ALBUMIN 4.0  --    Recent Labs    10/21/19 0000  WBC 10.5  HGB 12.0*  HCT 35*  PLT 238   Lab Results  Component Value Date   TSH 1.67 03/05/2018   Lab Results  Component Value Date   HGBA1C 7.4 10/22/2019   Lab Results  Component Value Date   CHOL 152 06/27/2013   HDL 55 06/27/2013   LDLCALC 78 06/27/2013   TRIG 83 06/27/2013    Significant Diagnostic Results in last 30 days:  No results found.  Assessment/Plan 1. Mixed Alzheimer's and vascular dementia ([place]) -progressing into latter stages now with more assistance needed with meals, recent skin breakdown, waxing and waning mentation, decreased speech, more fatigue -cont snf level of care -discussed briefly with Webb Silversmith today and several nurses have been addressing this, as well  2. Acute delirium -resolved, seems that episode was related to constipation  3. Bipolar 1 disorder, depressed, full remission ([place]) -seems unchanged, we've left these meds as is per his daughter's request due to prior behavioral concerns when they've been changed  4. Left hemiparesis ([place]) -now getting up in wheelchair some again and we're encouraging him to try to self-propel if able a bit  5. Oropharyngeal dysphagia -continue modified diet, assistance with meals, aspiration precautions  6. Type 2 diabetes mellitus with stage 3b chronic kidney disease, with long-term current use of insulin (HCC) -MBT5H certainly at goal for him -cont current regimen to allow for some sweets intake for QOL  Family/ staff Communication: spoke  with Webb Silversmith and nursing  Labs/tests ordered:  No new at this time  Sharlett Lienemann L. Lissie Hinesley, D.O. [facility] [address], [place] [address] Cell Phone (Mon-Fri 8am-5pm):  [phone] On Call:  [phone] & follow prompts after 5pm & weekends Office Phone:  [phone] Office Fax:  [phone]

## 2020-01-20 ENCOUNTER — Non-Acute Institutional Stay (SKILLED_NURSING_FACILITY): Payer: Medicare PPO | Admitting: Adult Health

## 2020-01-20 DIAGNOSIS — F3176 Bipolar disorder, in full remission, most recent episode depressed: Secondary | ICD-10-CM | POA: Diagnosis not present

## 2020-01-20 DIAGNOSIS — Z794 Long term (current) use of insulin: Secondary | ICD-10-CM

## 2020-01-20 DIAGNOSIS — E1121 Type 2 diabetes mellitus with diabetic nephropathy: Secondary | ICD-10-CM | POA: Diagnosis not present

## 2020-01-20 DIAGNOSIS — E1122 Type 2 diabetes mellitus with diabetic chronic kidney disease: Secondary | ICD-10-CM

## 2020-01-20 DIAGNOSIS — B309 Viral conjunctivitis, unspecified: Secondary | ICD-10-CM

## 2020-01-20 DIAGNOSIS — G309 Alzheimer's disease, unspecified: Secondary | ICD-10-CM

## 2020-01-20 DIAGNOSIS — F0280 Dementia in other diseases classified elsewhere without behavioral disturbance: Secondary | ICD-10-CM

## 2020-01-20 DIAGNOSIS — N1832 Chronic kidney disease, stage 3b: Secondary | ICD-10-CM

## 2020-01-20 DIAGNOSIS — I69322 Dysarthria following cerebral infarction: Secondary | ICD-10-CM

## 2020-01-20 DIAGNOSIS — S31809D Unspecified open wound of unspecified buttock, subsequent encounter: Secondary | ICD-10-CM

## 2020-01-20 DIAGNOSIS — F0150 Vascular dementia without behavioral disturbance: Secondary | ICD-10-CM

## 2020-01-21 ENCOUNTER — Encounter: Payer: Self-pay | Admitting: Adult Health

## 2020-01-21 NOTE — Progress Notes (Signed)
Location:  Occupational psychologist of Service:  SNF (31) Provider:   Cindi Carbon, ANP [place] [phone]   Gayland Curry, DO  Patient Care Team: Gayland Curry, DO as PCP - General (Geriatric Medicine) Fay Records, Well [name]  Extended Emergency Contact Information Primary Emergency Contact: [facility] Address: [address]          [place], [place] [address] [name] of [place] Phone: [phone] Mobile Phone: [phone] Relation: Daughter  Code Status:  DNR Goals of care: Advanced Directive information Advanced Directives 12/17/2019  Does Patient Have a Medical Advance Directive? Yes  Type of Advance Directive Out of facility DNR (pink MOST or yellow form)  Does patient want to make changes to medical advance directive? No - Patient declined  Copy of [place] in Chart? -  Pre-existing out of facility DNR order (yellow form or pink MOST form) Pink MOST/Yellow Form most recent copy in chart - Physician notified to receive inpatient order     Chief Complaint  Patient presents with   Medical Management of Chronic Issues    HPI:  Pt is a 86 y.o. male seen today for medical management of chronic diseases.    DM II: blood sugars are dropping to the 80s in the morning. No s/s of low blood sugar reported.  Lab Results  Component Value Date   HGBA1C 7.4 10/22/2019   Buttocks wound is healing per the nurse with less discomfort. He has a period of lying in bed each morning and each evening to relieve pressure and takes tylenol for pain  Continues with progressive memory loss, confusion, and speech impairment associated with his hx of dementia and CVA with left sided weakness. Continues on a D2 diet with no choking or coughing reported.    Bipolar: no issues of mania noted. Had a period of delirium possibly due to constipation which resolved. No issues sleeping or feeing depressed are  reported.    Past Medical History:  Diagnosis Date   Anemia, iron deficiency 12/30/2012   Basal cell carcinoma    Bipolar 1 disorder (HCC)    CKD (chronic kidney disease), stage II 07/09/2012   07/16/12:   stage II chronic kidney disease by GFR.  Follow lab at interval     Dementia ([place])    Diabetes ([place])    GI bleed    Hypertension    Mixed Alzheimer's and vascular dementia ([place]) 12/01/2012   06/07/12: STML re: CVA- contribute to debility/dependence. Pt. is aware of deficit. Will benefit from [place] environment for assistance, supervision, socialization    07/16/12:  Admission MDS reviewed: BIMS score 13/15,  functional status: Pt requires limited to extensive assist with all ADLs except eating (supervision)     Pressure ulcer of sacrum 12/07/2012   Squamous cell carcinoma of skin of scalp 01/24/14   anterior and posterior scalp    Stroke ([place]) 08/2009   Left side with residual deficit   Past Surgical History:  Procedure Laterality Date   HIP ARTHROPLASTY Left 12/01/2012   Procedure: ARTHROPLASTY BIPOLAR HIP;  Surgeon: Johnn Hai, MD;  Location: [facility];  Service: Orthopedics;  Laterality: Left;   SKIN LESION EXCISION  01/24/14   Anterior and posterio scalp Squamous cell [name] PA    No Known Allergies  Outpatient Encounter Medications as of 01/20/2020  Medication Sig   acetaminophen (TYLENOL) 500 MG tablet Take 1,000 mg by mouth in the morning, at  noon, and at bedtime.    antiseptic oral rinse (BIOTENE) LIQD 15 mLs by Mouth Rinse route 4 (four) times daily.   aspirin 81 MG tablet Take 81 mg by mouth daily.   carboxymethylcellulose (REFRESH PLUS) 0.5 % SOLN 2 drops 2 (two) times daily as needed.   cholecalciferol (VITAMIN D) 1000 UNITS tablet Take 1,000 Units by mouth every morning.   clomiPRAMINE (ANAFRANIL) 25 MG capsule Take 1 capsule (25 mg total) by mouth at bedtime.   collagenase (SANTYL) ointment Apply 1 application topically daily.    donepezil (ARICEPT) 10 MG tablet Take 10 mg by mouth daily.   Insulin Detemir (LEVEMIR FLEXTOUCH) 100 UNIT/ML Pen Inject 20 Units into the skin 2 (two) times daily.    ketoconazole (NIZORAL) 2 % shampoo Apply 1 application topically 2 (two) times a week.   latanoprost (XALATAN) 0.005 % ophthalmic solution 1 drop at bedtime.   metoprolol tartrate (LOPRESSOR) 25 MG tablet Take 75 mg by mouth 2 (two) times daily. Hold for heart rate less than 60   Multiple Vitamins-Minerals (CENTRUM SILVER 50+MEN) TABS Take by mouth daily.   QUEtiapine (SEROQUEL) 25 MG tablet Take 0.5 tablets (12.5 mg total) by mouth at bedtime.   tamsulosin (FLOMAX) 0.4 MG CAPS Take 0.4 mg by mouth daily after breakfast.   No facility-administered encounter medications on file as of 01/20/2020.    Review of Systems  Constitutional: Negative for activity change, appetite change, chills, diaphoresis, fatigue, fever and unexpected weight change.  Eyes: Positive for redness. Negative for photophobia, pain, discharge, itching and visual disturbance.  Respiratory: Negative for cough, shortness of breath, wheezing and stridor.   Cardiovascular: Positive for leg swelling. Negative for chest pain and palpitations.  Gastrointestinal: Negative for abdominal distention, abdominal pain, constipation and diarrhea.  Genitourinary: Negative for difficulty urinating and dysuria.  Musculoskeletal: Positive for gait problem. Negative for arthralgias, back pain, joint swelling and myalgias.  Skin: Positive for wound.  Neurological: Positive for weakness. Negative for dizziness, seizures, syncope, speech difficulty and headaches.  Hematological: Negative for adenopathy. Does not bruise/bleed easily.  Psychiatric/Behavioral: Positive for confusion. Negative for agitation and behavioral problems.    Immunization History  Administered Date(s) Administered   Influenza Inj Mdck Quad Pf 03/17/2016   Influenza Whole 03/13/2013   Influenza,  High Dose Seasonal PF 03/14/2019   Influenza,inj,Quad PF,6+ Mos 03/20/2018   Influenza-Unspecified 03/04/2014, 03/12/2015, 03/23/2017   Moderna SARS-COVID-2 Vaccination 06/04/2019, 07/02/2019   PPD Test 07/05/2012   Pneumococcal Conjugate-13 07/01/2016   Pneumococcal Polysaccharide-23 07/16/1996   Td 07/01/2016   Zoster Recombinat (Shingrix) 06/30/2017   Pertinent  Health Maintenance Due  Topic Date Due   INFLUENZA VACCINE  12/29/2019   OPHTHALMOLOGY EXAM  02/11/2020   HEMOGLOBIN A1C  04/23/2020   FOOT EXAM  08/11/2020   PNA vac Low Risk Adult  Completed   Fall Risk  04/12/2019 07/12/2018 04/28/2018 10/03/2017 09/28/2016  Falls in the past year? - 1 Exclusion - non ambulatory No No  Number falls in past yr: - 0 - - -  Injury with Fall? - 1 - - -  Risk for fall due to : History of fall(s);Impaired balance/gait;Impaired mobility;Mental status change;Medication side effect History of fall(s);Impaired mobility;Impaired balance/gait;Mental status change Impaired mobility - -  Follow up Falls evaluation completed;Education provided;Falls prevention discussed Falls evaluation completed;Education provided;Falls prevention discussed Education provided - -  Comment at [place] involved bathing apparatus and staff were reeducated  - - -   Functional Status Survey:    Vitals:  01/21/20 0921  Weight: 170 lb 12.8 oz (77.5 kg)   Body mass index is 23.82 kg/m. Physical Exam Vitals and nursing note reviewed.  Constitutional:      General: He is not in acute distress.    Appearance: He is not diaphoretic.  HENT:     Head: Normocephalic and atraumatic.     Nose: Nose normal. No congestion.     Mouth/Throat:     Mouth: Mucous membranes are moist.     Pharynx: Oropharynx is clear.  Eyes:     General:        Right eye: Discharge (clear) present. No hordeolum.        Left eye: No discharge.     Conjunctiva/sclera:     Right eye: Right conjunctiva is injected. No chemosis,  exudate or hemorrhage.    Left eye: Left conjunctiva is not injected. No chemosis, exudate or hemorrhage.    Pupils: Pupils are equal, round, and reactive to light.     Comments: No ciliary flush  Neck:     Thyroid: No thyromegaly.     Vascular: No JVD.     Trachea: No tracheal deviation.  Cardiovascular:     Rate and Rhythm: Normal rate and regular rhythm.     Heart sounds: No murmur heard.   Pulmonary:     Effort: Pulmonary effort is normal. No respiratory distress.     Breath sounds: Normal breath sounds. No wheezing.  Abdominal:     General: Bowel sounds are normal. There is no distension.     Palpations: Abdomen is soft.     Tenderness: There is no abdominal tenderness.  Musculoskeletal:     Comments: BLE trace edema  Lymphadenopathy:     Cervical: No cervical adenopathy.  Skin:    General: Skin is warm and dry.  Neurological:     Mental Status: He is alert. Mental status is at baseline.     Comments: Oriented self and place.Chronic left sided weakness  Psychiatric:        Mood and Affect: Mood normal.     Labs reviewed: Recent Labs    05/13/19 0000 10/21/19 0000 10/21/19 0300  NA 141  --  144  K 4.4  --  4.1  CL 106  --  107  CO2 25*  --  20  BUN 21  --  28*  CREATININE 1.3  --  1.5*  CALCIUM 9.1 10.4  --    Recent Labs    10/21/19 0000 10/21/19 0300  AST  --  22  ALT  --  14  ALBUMIN 4.0  --    Recent Labs    10/21/19 0000  WBC 10.5  HGB 12.0*  HCT 35*  PLT 238   Lab Results  Component Value Date   TSH 1.67 03/05/2018   Lab Results  Component Value Date   HGBA1C 7.4 10/22/2019   Lab Results  Component Value Date   CHOL 152 06/27/2013   HDL 55 06/27/2013   LDLCALC 78 06/27/2013   TRIG 83 06/27/2013    Significant Diagnostic Results in last 30 days:  No results found.  Assessment/Plan  1. Viral conjunctivitis of right eye Refresh three times daily x 48 hrs Warm compresses three times daily x 48 hrs  2. Type 2 diabetes  mellitus with stage 3b chronic kidney disease, with long-term current use of insulin (HCC) Decrease Levemir 15 units bid  3. Bipolar 1 disorder, depressed, full remission ([place]) No current mood  issues. Has failed prior trials off seroquel and anafranil. Would continue current dosing due to risk/benefit   4. Mixed Alzheimer's and vascular dementia ([place]) Progressive decline in cognition and physical function c/w the disease. Continue supportive care in the skilled environment. Continue Aricept   5. 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   6. Wound of buttock, unspecified laterality, subsequent encounter Improving Continue wound care and santyl dressing changes    Family/ staff Communication: discussed with the nurse   Labs/tests ordered:  NA

## 2020-02-20 ENCOUNTER — Non-Acute Institutional Stay (SKILLED_NURSING_FACILITY): Payer: Medicare PPO | Admitting: Adult Health

## 2020-02-20 DIAGNOSIS — Z794 Long term (current) use of insulin: Secondary | ICD-10-CM

## 2020-02-20 DIAGNOSIS — E1122 Type 2 diabetes mellitus with diabetic chronic kidney disease: Secondary | ICD-10-CM

## 2020-02-20 DIAGNOSIS — N1832 Chronic kidney disease, stage 3b: Secondary | ICD-10-CM

## 2020-02-20 DIAGNOSIS — F0150 Vascular dementia without behavioral disturbance: Secondary | ICD-10-CM

## 2020-02-20 DIAGNOSIS — E1121 Type 2 diabetes mellitus with diabetic nephropathy: Secondary | ICD-10-CM | POA: Diagnosis not present

## 2020-02-20 DIAGNOSIS — R195 Other fecal abnormalities: Secondary | ICD-10-CM

## 2020-02-20 DIAGNOSIS — G309 Alzheimer's disease, unspecified: Secondary | ICD-10-CM

## 2020-02-20 DIAGNOSIS — F0280 Dementia in other diseases classified elsewhere without behavioral disturbance: Secondary | ICD-10-CM

## 2020-02-20 DIAGNOSIS — S31809D Unspecified open wound of unspecified buttock, subsequent encounter: Secondary | ICD-10-CM

## 2020-02-20 DIAGNOSIS — R1312 Dysphagia, oropharyngeal phase: Secondary | ICD-10-CM

## 2020-02-20 DIAGNOSIS — R634 Abnormal weight loss: Secondary | ICD-10-CM

## 2020-02-20 NOTE — Progress Notes (Signed)
Location:  Occupational psychologist of Service:  SNF (31) Provider:   Cindi Carbon, ANP [place] [phone]   Gayland Curry, DO  Patient Care Team: Gayland Curry, DO as PCP - General (Geriatric Medicine) Fay Records, Well [name]  Extended Emergency Contact Information Primary Emergency Contact: [facility] Address: [address]          [place], [place] [address] [name] of [place] Phone: [phone] Mobile Phone: [phone] Relation: Daughter  Code Status:  DNR Goals of care: Advanced Directive information Advanced Directives 12/17/2019  Does Patient Have a Medical Advance Directive? Yes  Type of Advance Directive Out of facility DNR (pink MOST or yellow form)  Does patient want to make changes to medical advance directive? No - Patient declined  Copy of [place] in Chart? -  Pre-existing out of facility DNR order (yellow form or pink MOST form) Pink MOST/Yellow Form most recent copy in chart - Physician notified to receive inpatient order     Chief Complaint  Patient presents with  . Medical Management of Chronic Issues    HPI:  Pt is a 86 y.o. male seen today for medical management of chronic diseases.    DM: CBGs ranging 120-224,  No episodes of hypoglycemia.  Weight loss: pt has lost 14 lbs since July with some fluctuation noted earlier this year. Continues on fiber supplement to keep stools formed to promote wound healing. Staff report gluteal fold wound is healing and making progress. He has not had any pain to this area and is taking tylenol for pain. Also takes prostat to promote wound healing.  Wt Readings from Last 3 Encounters:  02/24/20 166 lb (75.3 kg)  01/21/20 170 lb 12.8 oz (77.5 kg)  12/17/19 178 lb 12.8 oz (81.1 kg)   Continues with progressive dementia, vascular related with prior CVA  Continues on a D2 diet with thin liquids, no reports of choking.  Past Medical  History:  Diagnosis Date  . Anemia, iron deficiency 12/30/2012  . Basal cell carcinoma   . Bipolar 1 disorder ([place])   . CKD (chronic kidney disease), stage II 07/09/2012   07/16/12:   stage II chronic kidney disease by GFR.  Follow lab at interval    . Dementia ([place])   . Diabetes ([place])   . GI bleed   . Hypertension   . Mixed Alzheimer's and vascular dementia ([place]) 12/01/2012   06/07/12: STML re: CVA- contribute to debility/dependence. Pt. is aware of deficit. Will benefit from [place] environment for assistance, supervision, socialization    07/16/12:  Admission MDS reviewed: BIMS score 13/15,  functional status: Pt requires limited to extensive assist with all ADLs except eating (supervision)    . Pressure ulcer of sacrum 12/07/2012  . Squamous cell carcinoma of skin of scalp 01/24/14   anterior and posterior scalp   . Stroke [facility]) 08/2009   Left side with residual deficit   Past Surgical History:  Procedure Laterality Date  . HIP ARTHROPLASTY Left 12/01/2012   Procedure: ARTHROPLASTY BIPOLAR HIP;  Surgeon: Johnn Hai, MD;  Location: [facility];  Service: Orthopedics;  Laterality: Left;  . SKIN LESION EXCISION  01/24/14   Anterior and posterio scalp Squamous cell [name] PA    No Known Allergies  Outpatient Encounter Medications as of 02/20/2020  Medication Sig  . acetaminophen (TYLENOL) 500 MG tablet Take 1,000 mg by mouth in the morning, at noon, and at bedtime.   Marland Kitchen  antiseptic oral rinse (BIOTENE) LIQD 15 mLs by Mouth Rinse route 4 (four) times daily.  Marland Kitchen aspirin 81 MG tablet Take 81 mg by mouth daily.  . carboxymethylcellulose (REFRESH PLUS) 0.5 % SOLN 2 drops 2 (two) times daily as needed.  . cholecalciferol (VITAMIN D) 1000 UNITS tablet Take 1,000 Units by mouth every morning.  . clomiPRAMINE (ANAFRANIL) 25 MG capsule Take 1 capsule (25 mg total) by mouth at bedtime.  . collagenase (SANTYL) ointment Apply 1 application topically daily.  Marland Kitchen donepezil (ARICEPT) 10 MG tablet Take  10 mg by mouth daily.  . Insulin Detemir (LEVEMIR FLEXTOUCH) 100 UNIT/ML Pen Inject 15 Units into the skin 2 (two) times daily.   Marland Kitchen ketoconazole (NIZORAL) 2 % shampoo Apply 1 application topically 2 (two) times a week.  . latanoprost (XALATAN) 0.005 % ophthalmic solution 1 drop at bedtime.  . metoprolol tartrate (LOPRESSOR) 25 MG tablet Take 75 mg by mouth 2 (two) times daily. Hold for heart rate less than 60  . Multiple Vitamins-Minerals (CENTRUM SILVER 50+MEN) TABS Take by mouth daily.  . Nutritional Supplements (NUTRITIONAL SUPPLEMENT PO) Take 15 mLs by mouth in the morning and at bedtime. Fiberstat to help create formed stools  . Pollen Extracts (PROSTAT PO) Take 1 oz by mouth daily.  . QUEtiapine (SEROQUEL) 25 MG tablet Take 0.5 tablets (12.5 mg total) by mouth at bedtime.  . tamsulosin (FLOMAX) 0.4 MG CAPS Take 0.4 mg by mouth daily after breakfast.   No facility-administered encounter medications on file as of 02/20/2020.    Review of Systems  Constitutional: Negative for activity change, appetite change, chills, diaphoresis, fatigue, fever and unexpected weight change.  Respiratory: Negative for cough, shortness of breath, wheezing and stridor.   Cardiovascular: Positive for leg swelling. Negative for chest pain and palpitations.  Gastrointestinal: Negative for abdominal distention, abdominal pain, constipation and diarrhea.  Genitourinary: Negative for difficulty urinating and dysuria.  Musculoskeletal: Positive for gait problem. Negative for arthralgias, back pain, joint swelling and myalgias.  Skin: Positive for wound.  Neurological: Positive for weakness. Negative for dizziness, seizures, syncope, facial asymmetry, speech difficulty and headaches.  Hematological: Negative for adenopathy. Does not bruise/bleed easily.  Psychiatric/Behavioral: Positive for confusion. Negative for agitation and behavioral problems.    Immunization History  Administered Date(s) Administered  .  Influenza Inj Mdck Quad Pf 03/17/2016  . Influenza Whole 03/13/2013  . Influenza, High Dose Seasonal PF 03/14/2019  . Influenza,inj,Quad PF,6+ Mos 03/20/2018  . Influenza-Unspecified 03/04/2014, 03/12/2015, 03/23/2017  . Moderna SARS-COVID-2 Vaccination 06/04/2019, 07/02/2019  . PPD Test 07/05/2012  . Pneumococcal Conjugate-13 07/01/2016  . Pneumococcal Polysaccharide-23 07/16/1996  . Td 07/01/2016  . Zoster Recombinat (Shingrix) 06/30/2017   Pertinent  Health Maintenance Due  Topic Date Due  . INFLUENZA VACCINE  12/29/2019  . OPHTHALMOLOGY EXAM  02/11/2020  . HEMOGLOBIN A1C  04/23/2020  . FOOT EXAM  08/11/2020  . PNA vac Low Risk Adult  Completed   Fall Risk  04/12/2019 07/12/2018 04/28/2018 10/03/2017 09/28/2016  Falls in the past year? - 1 Exclusion - non ambulatory No No  Number falls in past yr: - 0 - - -  Injury with Fall? - 1 - - -  Risk for fall due to : History of fall(s);Impaired balance/gait;Impaired mobility;Mental status change;Medication side effect History of fall(s);Impaired mobility;Impaired balance/gait;Mental status change Impaired mobility - -  Follow up Falls evaluation completed;Education provided;Falls prevention discussed Falls evaluation completed;Education provided;Falls prevention discussed Education provided - -  Comment at [place] involved bathing apparatus and  staff were reeducated  - - -   Functional Status Survey:    Vitals:   02/24/20 1510  Weight: 166 lb (75.3 kg)   Body mass index is 23.15 kg/m. Physical Exam Vitals and nursing note reviewed.  Constitutional:      General: He is not in acute distress.    Appearance: He is not diaphoretic.  HENT:     Head: Normocephalic and atraumatic.  Neck:     Thyroid: No thyromegaly.     Vascular: No JVD.     Trachea: No tracheal deviation.  Cardiovascular:     Rate and Rhythm: Normal rate and regular rhythm.     Heart sounds: No murmur heard.   Pulmonary:     Effort: Pulmonary effort is  normal. No respiratory distress.     Breath sounds: Normal breath sounds. No wheezing.  Abdominal:     General: Bowel sounds are normal. There is no distension.     Palpations: Abdomen is soft.     Tenderness: There is no abdominal tenderness.  Musculoskeletal:     Cervical back: No rigidity or tenderness.     Right lower leg: No edema.     Left lower leg: Edema present.  Lymphadenopathy:     Cervical: No cervical adenopathy.  Skin:    General: Skin is warm and dry.  Neurological:     Mental Status: He is alert.     Cranial Nerves: No cranial nerve deficit.     Comments: Left hemiparesis. Alert and oriented to self and location. Confused about the details of his care.      Labs reviewed: Recent Labs    05/13/19 0000 10/21/19 0000 10/21/19 0300  NA 141  --  144  K 4.4  --  4.1  CL 106  --  107  CO2 25*  --  20  BUN 21  --  28*  CREATININE 1.3  --  1.5*  CALCIUM 9.1 10.4  --    Recent Labs    10/21/19 0000 10/21/19 0300  AST  --  22  ALT  --  14  ALBUMIN 4.0  --    Recent Labs    10/21/19 0000  WBC 10.5  HGB 12.0*  HCT 35*  PLT 238   Lab Results  Component Value Date   TSH 1.67 03/05/2018   Lab Results  Component Value Date   HGBA1C 7.4 10/22/2019   Lab Results  Component Value Date   CHOL 152 06/27/2013   HDL 55 06/27/2013   LDLCALC 78 06/27/2013   TRIG 83 06/27/2013    Significant Diagnostic Results in last 30 days:  No results found.  Assessment/Plan  1. Mixed Alzheimer's and vascular dementia ([place]) Progressive and moving towards the latter stages DNR and most form in place Continues on Aricept, could consider taper in the future.   2. Oropharyngeal dysphagia Continue D2 diet Asp prec 1:1 supervision  3. Type 2 diabetes mellitus with stage 3b chronic kidney disease, with long-term current use of insulin (HCC) Lab Results  Component Value Date   HGBA1C 7.4 10/22/2019  Continue Levemir 15 units bid Recommend eye exam if he still  going for apts   4. Loose stools Continue fiber stat bid  5. Wound of gluteal cleft, unspecified laterality, subsequent encounter Healing with wound care [facility] by [facility]   6. Weight loss Due to progressive dementia and dysphgia Continue to monitor    Family/ staff Communication: nurse  Labs/tests ordered:  NA

## 2020-02-24 ENCOUNTER — Encounter: Payer: Self-pay | Admitting: Adult Health

## 2020-03-19 ENCOUNTER — Non-Acute Institutional Stay (SKILLED_NURSING_FACILITY): Payer: Medicare PPO | Admitting: Adult Health

## 2020-03-19 DIAGNOSIS — N400 Enlarged prostate without lower urinary tract symptoms: Secondary | ICD-10-CM

## 2020-03-19 DIAGNOSIS — F0150 Vascular dementia without behavioral disturbance: Secondary | ICD-10-CM

## 2020-03-19 DIAGNOSIS — R1312 Dysphagia, oropharyngeal phase: Secondary | ICD-10-CM

## 2020-03-19 DIAGNOSIS — N1832 Chronic kidney disease, stage 3b: Secondary | ICD-10-CM

## 2020-03-19 DIAGNOSIS — F0280 Dementia in other diseases classified elsewhere without behavioral disturbance: Secondary | ICD-10-CM

## 2020-03-19 DIAGNOSIS — G309 Alzheimer's disease, unspecified: Secondary | ICD-10-CM

## 2020-03-19 DIAGNOSIS — E1122 Type 2 diabetes mellitus with diabetic chronic kidney disease: Secondary | ICD-10-CM

## 2020-03-19 DIAGNOSIS — R195 Other fecal abnormalities: Secondary | ICD-10-CM

## 2020-03-19 DIAGNOSIS — Z794 Long term (current) use of insulin: Secondary | ICD-10-CM

## 2020-03-20 ENCOUNTER — Encounter: Payer: Self-pay | Admitting: Adult Health

## 2020-03-20 NOTE — Progress Notes (Signed)
Location:  Occupational psychologist of Service:  SNF (31) Provider:   Cindi Carbon, ANP [place] [phone]   Gayland Curry, DO  Patient Care Team: Gayland Curry, DO as PCP - General (Geriatric Medicine) Fay Records, Well [name]  Extended Emergency Contact Information Primary Emergency Contact: [facility] Address: [address]          [place], [place] [address] [name] of [place] Phone: [phone] Mobile Phone: [phone] Relation: Daughter  Code Status:  DNR Goals of care: Advanced Directive information Advanced Directives 12/17/2019  Does Patient Have a Medical Advance Directive? Yes  Type of Advance Directive Out of facility DNR (pink MOST or yellow form)  Does patient want to make changes to medical advance directive? No - Patient declined  Copy of [place] in Chart? -  Pre-existing out of facility DNR order (yellow form or pink MOST form) Pink MOST/Yellow Form most recent copy in chart - Physician notified to receive inpatient order     Chief Complaint  Patient presents with  . Medical Management of Chronic Issues    HPI:  Pt is a 86 y.o. male seen today for medical management of chronic diseases.  Prior hx of CVA with left sided weakness.   DM II: blood sugars range 103-289  BP controlled  Continues on D2 ground diet tolerating well  Vascular dementia: progressing over the past year with less verbalization and more assistance and cuing needed MMSE 9/30 07/03/19  Gluteal wound healed so he no longer needs scheduled tylenol  Loose stools: takes fiber stat to help stools be formed. Having 2-3 BMs per day (loose at times) Past Medical History:  Diagnosis Date  . Anemia, iron deficiency 12/30/2012  . Basal cell carcinoma   . Bipolar 1 disorder ([place])   . CKD (chronic kidney disease), stage II 07/09/2012   07/16/12:   stage II chronic kidney disease by GFR.  Follow lab at  interval    . Dementia ([place])   . Diabetes ([place])   . GI bleed   . Hypertension   . Mixed Alzheimer's and vascular dementia ([place]) 12/01/2012   06/07/12: STML re: CVA- contribute to debility/dependence. Pt. is aware of deficit. Will benefit from [place] environment for assistance, supervision, socialization    07/16/12:  Admission MDS reviewed: BIMS score 13/15,  functional status: Pt requires limited to extensive assist with all ADLs except eating (supervision)    . Pressure ulcer of sacrum 12/07/2012  . Squamous cell carcinoma of skin of scalp 01/24/14   anterior and posterior scalp   . Stroke [facility]) 08/2009   Left side with residual deficit   Past Surgical History:  Procedure Laterality Date  . HIP ARTHROPLASTY Left 12/01/2012   Procedure: ARTHROPLASTY BIPOLAR HIP;  Surgeon: Johnn Hai, MD;  Location: [facility]S;  Service: Orthopedics;  Laterality: Left;  . SKIN LESION EXCISION  01/24/14   Anterior and posterio scalp Squamous cell [name] PA    No Known Allergies  Outpatient Encounter Medications as of 03/19/2020  Medication Sig  . aspirin 81 MG tablet Take 81 mg by mouth daily.  . carboxymethylcellulose (REFRESH PLUS) 0.5 % SOLN 2 drops 2 (two) times daily as needed.  . cholecalciferol (VITAMIN D) 1000 UNITS tablet Take 1,000 Units by mouth every morning.  . clomiPRAMINE (ANAFRANIL) 25 MG capsule Take 1 capsule (25 mg total) by mouth at bedtime.  . donepezil (ARICEPT) 10 MG tablet Take 10  mg by mouth daily.  . Insulin Detemir (LEVEMIR FLEXTOUCH) 100 UNIT/ML Pen Inject 15 Units into the skin 2 (two) times daily.   Marland Kitchen ketoconazole (NIZORAL) 2 % shampoo Apply 1 application topically 2 (two) times a week.  . latanoprost (XALATAN) 0.005 % ophthalmic solution 1 drop at bedtime.  . metoprolol tartrate (LOPRESSOR) 25 MG tablet Take 75 mg by mouth 2 (two) times daily. Hold for heart rate less than 60  . Multiple Vitamins-Minerals (CENTRUM SILVER 50+MEN) TABS Take by mouth daily.  .  Nutritional Supplements (NUTRITIONAL SUPPLEMENT PO) Take 15 mLs by mouth in the morning and at bedtime. Fiberstat to help create formed stools  . QUEtiapine (SEROQUEL) 25 MG tablet Take 0.5 tablets (12.5 mg total) by mouth at bedtime.  . tamsulosin (FLOMAX) 0.4 MG CAPS Take 0.4 mg by mouth daily after breakfast.  . antiseptic oral rinse (BIOTENE) LIQD 15 mLs by Mouth Rinse route 4 (four) times daily.  . [DISCONTINUED] acetaminophen (TYLENOL) 500 MG tablet Take 1,000 mg by mouth in the morning, at noon, and at bedtime.   . [DISCONTINUED] collagenase (SANTYL) ointment Apply 1 application topically daily.  . [DISCONTINUED] Pollen Extracts (PROSTAT PO) Take 1 oz by mouth daily.   No facility-administered encounter medications on file as of 03/19/2020.    Review of Systems  Constitutional: Negative for activity change, appetite change, chills, diaphoresis, fatigue, fever and unexpected weight change.  Respiratory: Negative for cough, shortness of breath, wheezing and stridor.   Cardiovascular: Positive for leg swelling. Negative for chest pain and palpitations.  Gastrointestinal: Negative for abdominal distention, abdominal pain, constipation and diarrhea.  Genitourinary: Negative for difficulty urinating and dysuria.  Musculoskeletal: Positive for gait problem. Negative for arthralgias, back pain, joint swelling and myalgias.  Skin: Positive for wound.  Neurological: Positive for weakness. Negative for dizziness, seizures, syncope, facial asymmetry, speech difficulty and headaches.  Hematological: Negative for adenopathy. Does not bruise/bleed easily.  Psychiatric/Behavioral: Positive for confusion. Negative for agitation and behavioral problems.    Immunization History  Administered Date(s) Administered  . Influenza Inj Mdck Quad Pf 03/17/2016  . Influenza Whole 03/13/2013  . Influenza, High Dose Seasonal PF 03/14/2019  . Influenza,inj,Quad PF,6+ Mos 03/20/2018  . Influenza-Unspecified  03/04/2014, 03/12/2015, 03/23/2017  . Moderna SARS-COVID-2 Vaccination 06/04/2019, 07/02/2019  . PPD Test 07/05/2012  . Pneumococcal Conjugate-13 07/01/2016  . Pneumococcal Polysaccharide-23 07/16/1996  . Td 07/01/2016  . Zoster Recombinat (Shingrix) 06/30/2017   Pertinent  Health Maintenance Due  Topic Date Due  . INFLUENZA VACCINE  12/29/2019  . OPHTHALMOLOGY EXAM  02/11/2020  . HEMOGLOBIN A1C  04/23/2020  . FOOT EXAM  08/11/2020  . PNA vac Low Risk Adult  Completed   Fall Risk  04/12/2019 07/12/2018 04/28/2018 10/03/2017 09/28/2016  Falls in the past year? - 1 Exclusion - non ambulatory No No  Number falls in past yr: - 0 - - -  Injury with Fall? - 1 - - -  Risk for fall due to : History of fall(s);Impaired balance/gait;Impaired mobility;Mental status change;Medication side effect History of fall(s);Impaired mobility;Impaired balance/gait;Mental status change Impaired mobility - -  Follow up Falls evaluation completed;Education provided;Falls prevention discussed Falls evaluation completed;Education provided;Falls prevention discussed Education provided - -  Comment at [place] involved bathing apparatus and staff were reeducated  - - -   Functional Status Survey:    Vitals:   03/20/20 1135  Weight: 166 lb 9.6 oz (75.6 kg)   Body mass index is 23.24 kg/m. Physical Exam Vitals and nursing note reviewed.  Constitutional:      General: He is not in acute distress.    Appearance: He is not diaphoretic.  HENT:     Head: Normocephalic and atraumatic.  Neck:     Thyroid: No thyromegaly.     Vascular: No JVD.     Trachea: No tracheal deviation.  Cardiovascular:     Rate and Rhythm: Normal rate and regular rhythm.     Heart sounds: No murmur heard.   Pulmonary:     Effort: Pulmonary effort is normal. No respiratory distress.     Breath sounds: Normal breath sounds. No wheezing.  Abdominal:     General: Bowel sounds are normal. There is no distension.     Palpations:  Abdomen is soft.     Tenderness: There is no abdominal tenderness.  Musculoskeletal:     Cervical back: No rigidity or tenderness.     Right lower leg: No edema.     Left lower leg: Edema present.  Lymphadenopathy:     Cervical: No cervical adenopathy.  Skin:    General: Skin is warm and dry.  Neurological:     Mental Status: He is alert.     Cranial Nerves: No cranial nerve deficit.     Comments: Left hemiparesis. Alert and oriented to self and location. Confused about the details of his care.      Labs reviewed: Recent Labs    05/13/19 0000 10/21/19 0000 10/21/19 0300  NA 141  --  144  K 4.4  --  4.1  CL 106  --  107  CO2 25*  --  20  BUN 21  --  28*  CREATININE 1.3  --  1.5*  CALCIUM 9.1 10.4  --    Recent Labs    10/21/19 0000 10/21/19 0300  AST  --  22  ALT  --  14  ALBUMIN 4.0  --    Recent Labs    10/21/19 0000  WBC 10.5  HGB 12.0*  HCT 35*  PLT 238   Lab Results  Component Value Date   TSH 1.67 03/05/2018   Lab Results  Component Value Date   HGBA1C 7.4 10/22/2019   Lab Results  Component Value Date   CHOL 152 06/27/2013   HDL 55 06/27/2013   LDLCALC 78 06/27/2013   TRIG 83 06/27/2013    Significant Diagnostic Results in last 30 days:  No results found.  Assessment/Plan  1. Type 2 diabetes mellitus with stage 3b chronic kidney disease, with long-term current use of insulin (HCC) Continue Levemir 15 units bid Check A1C q 6 months  2. Stage 3b chronic kidney disease ([place]) Continue to periodically monitor BMP and avoid nephrotoxic agents  3. Oropharyngeal dysphagia Continue D2 diet Asp prec  4. Mixed Alzheimer's and vascular dementia ([place]) Progressive decline in cognition and physical function c/w the disease. Continue supportive care in the skilled environment.  5. Loose stools Slight improvement noted with Fiber stat. Could consider discontinuing Aricept in the future  6. Benign prostatic hyperplasia without lower urinary  tract symptoms No symptoms Continue Flomax 0.4 mg qd   Family/ staff Communication: nurse  Labs/tests ordered:  NA

## 2020-04-17 ENCOUNTER — Encounter: Payer: Self-pay | Admitting: Adult Health

## 2020-04-17 ENCOUNTER — Non-Acute Institutional Stay (SKILLED_NURSING_FACILITY): Payer: Medicare PPO | Admitting: Adult Health

## 2020-04-17 DIAGNOSIS — G309 Alzheimer's disease, unspecified: Secondary | ICD-10-CM

## 2020-04-17 DIAGNOSIS — N1832 Chronic kidney disease, stage 3b: Secondary | ICD-10-CM | POA: Diagnosis not present

## 2020-04-17 DIAGNOSIS — E1122 Type 2 diabetes mellitus with diabetic chronic kidney disease: Secondary | ICD-10-CM

## 2020-04-17 DIAGNOSIS — Z794 Long term (current) use of insulin: Secondary | ICD-10-CM

## 2020-04-17 DIAGNOSIS — F0280 Dementia in other diseases classified elsewhere without behavioral disturbance: Secondary | ICD-10-CM

## 2020-04-17 DIAGNOSIS — G2401 Drug induced subacute dyskinesia: Secondary | ICD-10-CM | POA: Diagnosis not present

## 2020-04-17 DIAGNOSIS — G8194 Hemiplegia, unspecified affecting left nondominant side: Secondary | ICD-10-CM

## 2020-04-17 DIAGNOSIS — F3176 Bipolar disorder, in full remission, most recent episode depressed: Secondary | ICD-10-CM

## 2020-04-17 DIAGNOSIS — F0150 Vascular dementia without behavioral disturbance: Secondary | ICD-10-CM

## 2020-04-17 NOTE — Progress Notes (Signed)
Location:  Occupational psychologist of Service:  SNF (31) Provider:   Cindi Carbon, ANP [place] [phone]   Gayland Curry, DO  Patient Care Team: Gayland Curry, DO as PCP - General (Geriatric Medicine) Fay Records, Well [name]  Extended Emergency Contact Information Primary Emergency Contact: [facility] Address: [address]          [place], [place] [address] [name] of [place] Phone: [phone] Mobile Phone: [phone] Relation: Daughter  Code Status:  DNR Goals of care: Advanced Directive information Advanced Directives 12/17/2019  Does Patient Have a Medical Advance Directive? Yes  Type of Advance Directive Out of facility DNR (pink MOST or yellow form)  Does patient want to make changes to medical advance directive? No - Patient declined  Copy of [place] in Chart? -  Pre-existing out of facility DNR order (yellow form or pink MOST form) Pink MOST/Yellow Form most recent copy in chart - Physician notified to receive inpatient order     Chief Complaint  Patient presents with   Medical Management of Chronic Issues    HPI:  Pt is a 86 y.o. male seen today for medical management of chronic diseases.   Ms. Stroebel has a hx of CVA with left sided weakness. A hoyer lift is used to get him out of bed. He requires extensive assistance with ADls. He also has dementia which has progressed in the past year. He is less talkative and engaging but remains pleasant with no aggressive behaviors. Participates in activities when able.   He has a hx of bipolar. His mood has been stable for quite sometime. He has a head tic which may be due to prolonged medication. He has not tolerated dose reduction in the past.   He is not going out to the eye doc any more or the dentist any more due to his dementia progression.   Blood sugars are well controlled in the 120-200 range most of the time.    Past  Medical History:  Diagnosis Date   Anemia, iron deficiency 12/30/2012   Basal cell carcinoma    Bipolar 1 disorder (HCC)    CKD (chronic kidney disease), stage II 07/09/2012   07/16/12:   stage II chronic kidney disease by GFR.  Follow lab at interval     Dementia ([place])    Diabetes ([place])    GI bleed    Hypertension    Mixed Alzheimer's and vascular dementia ([place]) 12/01/2012   06/07/12: STML re: CVA- contribute to debility/dependence. Pt. is aware of deficit. Will benefit from [place] environment for assistance, supervision, socialization    07/16/12:  Admission MDS reviewed: BIMS score 13/15,  functional status: Pt requires limited to extensive assist with all ADLs except eating (supervision)     Pressure ulcer of sacrum 12/07/2012   Squamous cell carcinoma of skin of scalp 01/24/14   anterior and posterior scalp    Stroke ([place]) 08/2009   Left side with residual deficit   Past Surgical History:  Procedure Laterality Date   HIP ARTHROPLASTY Left 12/01/2012   Procedure: ARTHROPLASTY BIPOLAR HIP;  Surgeon: Johnn Hai, MD;  Location: [facility];  Service: Orthopedics;  Laterality: Left;   SKIN LESION EXCISION  01/24/14   Anterior and posterio scalp Squamous cell [name] PA    No Known Allergies  Outpatient Encounter Medications as of 04/17/2020  Medication Sig   antiseptic oral rinse (BIOTENE) LIQD 15 mLs by  Mouth Rinse route 4 (four) times daily.   aspirin 81 MG tablet Take 81 mg by mouth daily.   carboxymethylcellulose (REFRESH PLUS) 0.5 % SOLN 2 drops 2 (two) times daily as needed.   cholecalciferol (VITAMIN D) 1000 UNITS tablet Take 1,000 Units by mouth every morning.   clomiPRAMINE (ANAFRANIL) 25 MG capsule Take 1 capsule (25 mg total) by mouth at bedtime.   donepezil (ARICEPT) 10 MG tablet Take 10 mg by mouth daily.   Insulin Detemir (LEVEMIR FLEXTOUCH) 100 UNIT/ML Pen Inject 15 Units into the skin 2 (two) times daily.    ketoconazole (NIZORAL) 2 % shampoo  Apply 1 application topically 2 (two) times a week.   latanoprost (XALATAN) 0.005 % ophthalmic solution 1 drop at bedtime.   metoprolol tartrate (LOPRESSOR) 25 MG tablet Take 75 mg by mouth 2 (two) times daily. Hold for heart rate less than 60   Multiple Vitamins-Minerals (CENTRUM SILVER 50+MEN) TABS Take by mouth daily.   Nutritional Supplements (NUTRITIONAL SUPPLEMENT PO) Take 15 mLs by mouth in the morning and at bedtime. Fiberstat to help create formed stools   QUEtiapine (SEROQUEL) 25 MG tablet Take 0.5 tablets (12.5 mg total) by mouth at bedtime.   tamsulosin (FLOMAX) 0.4 MG CAPS Take 0.4 mg by mouth daily after breakfast.   No facility-administered encounter medications on file as of 04/17/2020.    Review of Systems  Constitutional: Negative for activity change, appetite change, chills, diaphoresis, fatigue, fever and unexpected weight change.  Respiratory: Negative for cough, shortness of breath, wheezing and stridor.   Cardiovascular: Positive for leg swelling. Negative for chest pain and palpitations.  Gastrointestinal: Negative for abdominal distention, abdominal pain, constipation and diarrhea.  Genitourinary: Negative for difficulty urinating and dysuria.  Musculoskeletal: Positive for gait problem. Negative for arthralgias, back pain, joint swelling and myalgias.  Skin: Negative for wound.  Neurological: Positive for weakness. Negative for dizziness, seizures, syncope, facial asymmetry, speech difficulty and headaches.  Hematological: Negative for adenopathy. Does not bruise/bleed easily.  Psychiatric/Behavioral: Positive for confusion. Negative for agitation and behavioral problems.    Immunization History  Administered Date(s) Administered   Influenza Inj Mdck Quad Pf 03/17/2016   Influenza Whole 03/13/2013   Influenza, High Dose Seasonal PF 03/14/2019   Influenza,inj,Quad PF,6+ Mos 03/20/2018   Influenza-Unspecified 03/04/2014, 03/12/2015, 03/23/2017,  03/23/2020   Moderna SARS-COVID-2 Vaccination 06/04/2019, 07/02/2019   PPD Test 07/05/2012   Pneumococcal Conjugate-13 07/01/2016   Pneumococcal Polysaccharide-23 07/16/1996   Td 07/01/2016   Zoster Recombinat (Shingrix) 06/30/2017   Pertinent  Health Maintenance Due  Topic Date Due   OPHTHALMOLOGY EXAM  04/17/2020 (Originally 02/11/2020)   HEMOGLOBIN A1C  04/23/2020   FOOT EXAM  08/11/2020   INFLUENZA VACCINE  Completed   PNA vac Low Risk Adult  Completed   Fall Risk  04/12/2019 07/12/2018 04/28/2018 10/03/2017 09/28/2016  Falls in the past year? - 1 Exclusion - non ambulatory No No  Number falls in past yr: - 0 - - -  Injury with Fall? - 1 - - -  Risk for fall due to : History of fall(s);Impaired balance/gait;Impaired mobility;Mental status change;Medication side effect History of fall(s);Impaired mobility;Impaired balance/gait;Mental status change Impaired mobility - -  Follow up Falls evaluation completed;Education provided;Falls prevention discussed Falls evaluation completed;Education provided;Falls prevention discussed Education provided - -  Comment at [place] involved bathing apparatus and staff were reeducated  - - -   Functional Status Survey:    Vitals:   04/17/20 1132  Weight: 169 lb (76.7 kg)  Body mass index is 23.57 kg/m. Physical Exam Vitals and nursing note reviewed.  Constitutional:      General: He is not in acute distress.    Appearance: He is not diaphoretic.  HENT:     Head: Normocephalic and atraumatic.  Neck:     Thyroid: No thyromegaly.     Vascular: No JVD.     Trachea: No tracheal deviation.  Cardiovascular:     Rate and Rhythm: Normal rate and regular rhythm.     Heart sounds: No murmur heard.   Pulmonary:     Effort: Pulmonary effort is normal. No respiratory distress.     Breath sounds: Normal breath sounds. No wheezing.  Abdominal:     General: Bowel sounds are normal. There is no distension.     Palpations: Abdomen is  soft.     Tenderness: There is no abdominal tenderness.  Musculoskeletal:     Cervical back: No rigidity or tenderness.     Right lower leg: No edema.     Left lower leg: Edema present.  Lymphadenopathy:     Cervical: No cervical adenopathy.  Skin:    General: Skin is warm and dry.  Neurological:     Mental Status: He is alert.     Cranial Nerves: No cranial nerve deficit.     Comments: Left hemiparesis. Alert and oriented to self and location. Confused about the details of his care. Head tic noted on exam     Labs reviewed: Recent Labs    05/13/19 0000 10/21/19 0000 10/21/19 0300  NA 141  --  144  K 4.4  --  4.1  CL 106  --  107  CO2 25*  --  20  BUN 21  --  28*  CREATININE 1.3  --  1.5*  CALCIUM 9.1 10.4  --    Recent Labs    10/21/19 0000 10/21/19 0300  AST  --  22  ALT  --  14  ALBUMIN 4.0  --    Recent Labs    10/21/19 0000  WBC 10.5  HGB 12.0*  HCT 35*  PLT 238   Lab Results  Component Value Date   TSH 1.67 03/05/2018   Lab Results  Component Value Date   HGBA1C 7.4 10/22/2019   Lab Results  Component Value Date   CHOL 152 06/27/2013   HDL 55 06/27/2013   LDLCALC 78 06/27/2013   TRIG 83 06/27/2013    Significant Diagnostic Results in last 30 days:  No results found.  Assessment/Plan  1. Bipolar 1 disorder, depressed, full remission ([place]) Mood is stable but tapering of his meds may lead to destabilization as they have in the past. Would continue Seroquel 12.5 mg qhs and Anafranil 25 mg qhs   2. Medication induced dyskinesia Family aware, see above   3. Type 2 diabetes mellitus with stage 3b chronic kidney disease, with long-term current use of insulin (HCC) Continue Levemir 15 units bid Needs A1C  4. Stage 3b chronic kidney disease ([place]) Continue to periodically monitor BMP and avoid nephrotoxic agents  5. Mixed Alzheimer's and vascular dementia ([place]) Progressed over the past year. Has some functional capability and therefore  remains on Aricept due to any added benefit.   6. Left hemiparesis (HCC) Due to prior CVA. Has supportive chair with hand tray.   Labs/tests ordered:  A1C

## 2020-04-20 LAB — HEMOGLOBIN A1C: Hemoglobin A1C: 6

## 2020-04-30 ENCOUNTER — Encounter: Payer: Self-pay | Admitting: Internal Medicine

## 2020-05-25 ENCOUNTER — Non-Acute Institutional Stay (SKILLED_NURSING_FACILITY): Payer: Medicare PPO | Admitting: Adult Health

## 2020-05-25 DIAGNOSIS — Z794 Long term (current) use of insulin: Secondary | ICD-10-CM

## 2020-05-25 DIAGNOSIS — E1122 Type 2 diabetes mellitus with diabetic chronic kidney disease: Secondary | ICD-10-CM | POA: Diagnosis not present

## 2020-05-25 DIAGNOSIS — I10 Essential (primary) hypertension: Secondary | ICD-10-CM

## 2020-05-25 DIAGNOSIS — N1832 Chronic kidney disease, stage 3b: Secondary | ICD-10-CM | POA: Diagnosis not present

## 2020-05-25 DIAGNOSIS — G309 Alzheimer's disease, unspecified: Secondary | ICD-10-CM | POA: Diagnosis not present

## 2020-05-25 DIAGNOSIS — F0150 Vascular dementia without behavioral disturbance: Secondary | ICD-10-CM

## 2020-05-25 DIAGNOSIS — F0280 Dementia in other diseases classified elsewhere without behavioral disturbance: Secondary | ICD-10-CM

## 2020-05-25 NOTE — Progress Notes (Signed)
Location:  Occupational psychologist of Service:  SNF (31) Provider:   Cindi Carbon, ANP [place] [phone]   Gayland Curry, DO  Patient Care Team: Gayland Curry, DO as PCP - General (Geriatric Medicine) Fay Records, Well [name]  Extended Emergency Contact Information Primary Emergency Contact: [facility] Address: [address]          [place], [place] [address] [name] of [place] Phone: [phone] Mobile Phone: [phone] Relation: Daughter  Code Status:  DNR Goals of care: Advanced Directive information Advanced Directives 12/17/2019  Does Patient Have a Medical Advance Directive? Yes  Type of Advance Directive Out of facility DNR (pink MOST or yellow form)  Does patient want to make changes to medical advance directive? No - Patient declined  Copy of [place] in Chart? -  Pre-existing out of facility DNR order (yellow form or pink MOST form) Pink MOST/Yellow Form most recent copy in chart - Physician notified to receive inpatient order     Chief Complaint  Patient presents with  . Medical Management of Chronic Issues    HPI:  Pt is a 86 y.o. male seen today for medical management of chronic diseases.   This morning 02 sats were checked and found to 71%.  Pt was in no distress. When I went in to check on him and recheck his 02 sats I noticed that the pulse on the 02 machine does not match his apical auscultated pulse. Another machine was brought in and sats wree 100% with a pulse of 69 matching the apical rate.   Otherwise, there are no acute concerns regarding George Kemp. He resides in the skilled care environment and has left sided weakness due to a prior CVA.  He requires assistance for all ADLs and has been progressively more forgetful over the past year.   His A1C is trending downward. CBGs reviewed in matrix do not show any hypoglycemic events.  Lab Results  Component Value  Date   HGBA1C 6.0 04/20/2020      Past Medical History:  Diagnosis Date  . Anemia, iron deficiency 12/30/2012  . Basal cell carcinoma   . Bipolar 1 disorder ([place])   . CKD (chronic kidney disease), stage II 07/09/2012   07/16/12:   stage II chronic kidney disease by GFR.  Follow lab at interval    . Dementia ([place])   . Diabetes ([place])   . GI bleed   . Hypertension   . Mixed Alzheimer's and vascular dementia ([place]) 12/01/2012   06/07/12: STML re: CVA- contribute to debility/dependence. Pt. is aware of deficit. Will benefit from [place] environment for assistance, supervision, socialization    07/16/12:  Admission MDS reviewed: BIMS score 13/15,  functional status: Pt requires limited to extensive assist with all ADLs except eating (supervision)    . Pressure ulcer of sacrum 12/07/2012  . Squamous cell carcinoma of skin of scalp 01/24/14   anterior and posterior scalp   . Stroke [facility]) 08/2009   Left side with residual deficit   Past Surgical History:  Procedure Laterality Date  . HIP ARTHROPLASTY Left 12/01/2012   Procedure: ARTHROPLASTY BIPOLAR HIP;  Surgeon: Johnn Hai, MD;  Location: [facility];  Service: Orthopedics;  Laterality: Left;  . SKIN LESION EXCISION  01/24/14   Anterior and posterio scalp Squamous cell [name] PA    No Known Allergies  Outpatient Encounter Medications as of 05/25/2020  Medication Sig  .  antiseptic oral rinse (BIOTENE) LIQD 15 mLs by Mouth Rinse route 4 (four) times daily.  Marland Kitchen aspirin 81 MG tablet Take 81 mg by mouth daily.  . carboxymethylcellulose (REFRESH PLUS) 0.5 % SOLN 2 drops 2 (two) times daily as needed.  . cholecalciferol (VITAMIN D) 1000 UNITS tablet Take 1,000 Units by mouth every morning.  . clomiPRAMINE (ANAFRANIL) 25 MG capsule Take 1 capsule (25 mg total) by mouth at bedtime.  . donepezil (ARICEPT) 10 MG tablet Take 10 mg by mouth daily.  . Insulin Detemir (LEVEMIR FLEXTOUCH) 100 UNIT/ML Pen Inject 15 Units into the skin 2 (two) times  daily.   Marland Kitchen ketoconazole (NIZORAL) 2 % shampoo Apply 1 application topically 2 (two) times a week.  . latanoprost (XALATAN) 0.005 % ophthalmic solution 1 drop at bedtime.  . metoprolol tartrate (LOPRESSOR) 25 MG tablet Take 75 mg by mouth 2 (two) times daily. Hold for heart rate less than 60  . Multiple Vitamins-Minerals (CENTRUM SILVER 50+MEN) TABS Take by mouth daily.  . Nutritional Supplements (NUTRITIONAL SUPPLEMENT PO) Take 15 mLs by mouth in the morning and at bedtime. Fiberstat to help create formed stools  . QUEtiapine (SEROQUEL) 25 MG tablet Take 0.5 tablets (12.5 mg total) by mouth at bedtime.  . tamsulosin (FLOMAX) 0.4 MG CAPS Take 0.4 mg by mouth daily after breakfast.   No facility-administered encounter medications on file as of 05/25/2020.    Review of Systems  Constitutional: Negative for activity change, appetite change, chills, diaphoresis, fatigue, fever and unexpected weight change.  Respiratory: Negative for cough, shortness of breath, wheezing and stridor.   Cardiovascular: Positive for leg swelling. Negative for chest pain and palpitations.  Gastrointestinal: Negative for abdominal distention, abdominal pain, constipation and diarrhea.  Genitourinary: Negative for difficulty urinating and dysuria.  Musculoskeletal: Positive for gait problem. Negative for arthralgias, back pain, joint swelling and myalgias.  Skin: Negative for wound.  Neurological: Positive for weakness. Negative for dizziness, seizures, syncope, facial asymmetry, speech difficulty and headaches.  Hematological: Negative for adenopathy. Does not bruise/bleed easily.  Psychiatric/Behavioral: Positive for confusion. Negative for agitation and behavioral problems.    Immunization History  Administered Date(s) Administered  . Influenza Inj Mdck Quad Pf 03/17/2016  . Influenza Whole 03/13/2013  . Influenza, High Dose Seasonal PF 03/14/2019  . Influenza,inj,Quad PF,6+ Mos 03/20/2018  .  Influenza-Unspecified 03/04/2014, 03/12/2015, 03/23/2017, 03/19/2020  . Moderna Sars-Covid-2 Vaccination 06/04/2019, 07/02/2019  . PPD Test 07/05/2012  . Pneumococcal Conjugate-13 07/01/2016  . Pneumococcal Polysaccharide-23 07/16/1996  . Td 07/01/2016  . Zoster Recombinat (Shingrix) 06/30/2017   Pertinent  Health Maintenance Due  Topic Date Due  . OPHTHALMOLOGY EXAM  02/11/2020  . FOOT EXAM  08/11/2020  . HEMOGLOBIN A1C  10/18/2020  . INFLUENZA VACCINE  Completed  . PNA vac Low Risk Adult  Completed   Fall Risk  05/27/2020 04/12/2019 07/12/2018 04/28/2018 10/03/2017  Falls in the past year? 0 - 1 Exclusion - non ambulatory No  Number falls in past yr: 0 - 0 - -  Injury with Fall? 0 - 1 - -  Risk for fall due to : - History of fall(s);Impaired balance/gait;Impaired mobility;Mental status change;Medication side effect History of fall(s);Impaired mobility;Impaired balance/gait;Mental status change Impaired mobility -  Follow up - Falls evaluation completed;Education provided;Falls prevention discussed Falls evaluation completed;Education provided;Falls prevention discussed Education provided -  Comment - at [facility] involved bathing apparatus and staff were reeducated  - -   Functional Status Survey: Is the patient deaf or have difficulty hearing?: Yes  Does the patient have difficulty seeing, even when wearing glasses/contacts?: Yes Does the patient have difficulty concentrating, remembering, or making decisions?: Yes Does the patient have difficulty walking or climbing stairs?: Yes Does the patient have difficulty dressing or bathing?: Yes Does the patient have difficulty doing errands alone such as visiting a doctor's office or shopping?: Yes  Vitals:   05/25/20 1125  Weight: 172 lb 14.4 oz (78.4 kg)   Body mass index is 24.11 kg/m. Physical Exam Vitals and nursing note reviewed.  Constitutional:      General: He is not in acute distress.    Appearance: He is not  diaphoretic.  HENT:     Head: Normocephalic and atraumatic.  Neck:     Thyroid: No thyromegaly.     Vascular: No JVD.     Trachea: No tracheal deviation.  Cardiovascular:     Rate and Rhythm: Normal rate and regular rhythm.     Heart sounds: No murmur heard.   Pulmonary:     Effort: Pulmonary effort is normal. No respiratory distress.     Breath sounds: Normal breath sounds. No wheezing.  Abdominal:     General: Bowel sounds are normal. There is no distension.     Palpations: Abdomen is soft.     Tenderness: There is no abdominal tenderness.  Musculoskeletal:     Cervical back: No rigidity or tenderness.     Right lower leg: No edema.     Left lower leg: Edema present.  Lymphadenopathy:     Cervical: No cervical adenopathy.  Skin:    General: Skin is warm and dry.  Neurological:     Mental Status: He is alert.     Cranial Nerves: No cranial nerve deficit.     Comments: Left hemiparesis. Alert and oriented to self and location. Confused about the details of his care.     Labs reviewed: Recent Labs    10/21/19 0000 10/21/19 0300  NA  --  144  K  --  4.1  CL  --  107  CO2  --  20  BUN  --  28*  CREATININE  --  1.5*  CALCIUM 10.4  --    Recent Labs    10/21/19 0000 10/21/19 0300  AST  --  22  ALT  --  14  ALBUMIN 4.0  --    Recent Labs    10/21/19 0000  WBC 10.5  HGB 12.0*  HCT 35*  PLT 238   Lab Results  Component Value Date   TSH 1.67 03/05/2018   Lab Results  Component Value Date   HGBA1C 6.0 04/20/2020   Lab Results  Component Value Date   CHOL 152 06/27/2013   HDL 55 06/27/2013   LDLCALC 78 06/27/2013   TRIG 83 06/27/2013    Significant Diagnostic Results in last 30 days:  No results found.  Assessment/Plan  1. Type 2 diabetes mellitus with stage 3b chronic kidney disease, with long-term current use of insulin (HCC) Controlled.  Continue Levemir at 15 units bid  2. Stage 3b chronic kidney disease ([place]) Continue to periodically  monitor BMP and avoid nephrotoxic agents  3. Mixed Alzheimer's and vascular dementia ([place]) Moderate to severe Benefits from the skilled environment Continue Aricept  4. Primary hypertension Controlled Continue metoprololl 75 mg bid    Labs/tests ordered: NA

## 2020-05-27 ENCOUNTER — Encounter: Payer: Self-pay | Admitting: Adult Health

## 2020-06-08 ENCOUNTER — Non-Acute Institutional Stay (SKILLED_NURSING_FACILITY): Payer: Medicare PPO | Admitting: Adult Health

## 2020-06-08 ENCOUNTER — Encounter: Payer: Self-pay | Admitting: Adult Health

## 2020-06-08 DIAGNOSIS — N1832 Chronic kidney disease, stage 3b: Secondary | ICD-10-CM

## 2020-06-08 DIAGNOSIS — E1122 Type 2 diabetes mellitus with diabetic chronic kidney disease: Secondary | ICD-10-CM

## 2020-06-08 DIAGNOSIS — J9601 Acute respiratory failure with hypoxia: Secondary | ICD-10-CM | POA: Diagnosis not present

## 2020-06-08 DIAGNOSIS — R1312 Dysphagia, oropharyngeal phase: Secondary | ICD-10-CM | POA: Diagnosis not present

## 2020-06-08 DIAGNOSIS — F0280 Dementia in other diseases classified elsewhere without behavioral disturbance: Secondary | ICD-10-CM

## 2020-06-08 DIAGNOSIS — Z794 Long term (current) use of insulin: Secondary | ICD-10-CM

## 2020-06-08 DIAGNOSIS — F0150 Vascular dementia without behavioral disturbance: Secondary | ICD-10-CM

## 2020-06-08 DIAGNOSIS — G309 Alzheimer's disease, unspecified: Secondary | ICD-10-CM | POA: Diagnosis not present

## 2020-06-08 MED ORDER — LORAZEPAM 0.5 MG PO TABS: 0.5000 mg | ORAL_TABLET | ORAL | 0 refills | Status: DC | PRN

## 2020-06-08 MED ORDER — MORPHINE SULFATE (CONCENTRATE) 20 MG/ML PO SOLN: 5.0000 mg | ORAL | 0 refills | Status: AC | PRN

## 2020-06-08 NOTE — Progress Notes (Signed)
Location:  Occupational psychologist of Service:  SNF (31) Provider:   Cindi Carbon, ANP [place] [phone]   Gayland Curry, DO  Patient Care Team: Gayland Curry, DO as PCP - General (Geriatric Medicine) Fay Records, Well [name]  Extended Emergency Contact Information Primary Emergency Contact: [facility] Address: [address]          [place], [place] [address] [name] of [place] Phone: [phone] Mobile Phone: [phone] Relation: Daughter  Code Status:  DNR Goals of care: Advanced Directive information Advanced Directives 12/17/2019  Does Patient Have a Medical Advance Directive? Yes  Type of Advance Directive Out of facility DNR (pink MOST or yellow form)  Does patient want to make changes to medical advance directive? No - Patient declined  Copy of [place] in Chart? -  Pre-existing out of facility DNR order (yellow form or pink MOST form) Pink MOST/Yellow Form most recent copy in chart - Physician notified to receive inpatient order     Chief Complaint  Patient presents with  . Acute Visit    F/u hypoxia    HPI:  Pt is a 87 y.o. male seen today for an acute visit for hypoxia. George Kemp has a hx of CVA with left sided weakness and dysphagia. He experienced cyanosis and low 02 sats in the 60s on 06/06/20.  His goals of care are comfort based and he has a DNR in place. Morphine and Ativan were ordered. Oxygen was applied and his saturation improved. As of 06/08/20 he is on 3 liters of oxygen with 02 sats of 90%.  He is not cyanotic and there are no signs of cough or respiratory distress. He is weaker and has a decreased appetite. The acute event occurred after dinner and there is concern for aspiration. He is diabetic and on levemir 15 units bid. CBG 118.     Past Medical History:  Diagnosis Date  . Anemia, iron deficiency 12/30/2012  . Basal cell carcinoma   . Bipolar 1 disorder ([place])    . CKD (chronic kidney disease), stage II 07/09/2012   07/16/12:   stage II chronic kidney disease by GFR.  Follow lab at interval    . Dementia ([place])   . Diabetes ([place])   . GI bleed   . Hypertension   . Mixed Alzheimer's and vascular dementia ([place]) 12/01/2012   06/07/12: STML re: CVA- contribute to debility/dependence. Pt. is aware of deficit. Will benefit from [place] environment for assistance, supervision, socialization    07/16/12:  Admission MDS reviewed: BIMS score 13/15,  functional status: Pt requires limited to extensive assist with all ADLs except eating (supervision)    . Pressure ulcer of sacrum 12/07/2012  . Squamous cell carcinoma of skin of scalp 01/24/14   anterior and posterior scalp   . Stroke [facility]) 08/2009   Left side with residual deficit   Past Surgical History:  Procedure Laterality Date  . HIP ARTHROPLASTY Left 12/01/2012   Procedure: ARTHROPLASTY BIPOLAR HIP;  Surgeon: Johnn Hai, MD;  Location: [facility];  Service: Orthopedics;  Laterality: Left;  . SKIN LESION EXCISION  01/24/14   Anterior and posterio scalp Squamous cell [name] PA    No Known Allergies  Outpatient Encounter Medications as of 06/08/2020  Medication Sig  . [DISCONTINUED] LORazepam (ATIVAN) 0.5 MG tablet Take 0.5 mg by mouth every 4 (four) hours as needed for anxiety.  . [DISCONTINUED] morphine (ROXANOL) 20 MG/ML  concentrated solution Take 5 mg by mouth every 2 (two) hours as needed for severe pain.  Marland Kitchen antiseptic oral rinse (BIOTENE) LIQD 15 mLs by Mouth Rinse route 4 (four) times daily.  Marland Kitchen aspirin 81 MG tablet Take 81 mg by mouth daily.  . carboxymethylcellulose (REFRESH PLUS) 0.5 % SOLN 2 drops 2 (two) times daily as needed.  . cholecalciferol (VITAMIN D) 1000 UNITS tablet Take 1,000 Units by mouth every morning.  . clomiPRAMINE (ANAFRANIL) 25 MG capsule Take 1 capsule (25 mg total) by mouth at bedtime.  . insulin detemir (LEVEMIR) 100 UNIT/ML FlexPen Inject 5 Units into the skin 2 (two)  times daily.  Marland Kitchen ketoconazole (NIZORAL) 2 % shampoo Apply 1 application topically 2 (two) times a week.  . latanoprost (XALATAN) 0.005 % ophthalmic solution 1 drop at bedtime.  Marland Kitchen LORazepam (ATIVAN) 0.5 MG tablet Take 1 tablet (0.5 mg total) by mouth every 4 (four) hours as needed for anxiety.  . metoprolol tartrate (LOPRESSOR) 25 MG tablet Take 75 mg by mouth 2 (two) times daily. Hold for heart rate less than 60  . morphine (ROXANOL) 20 MG/ML concentrated solution Take 0.25 mLs (5 mg total) by mouth every 2 (two) hours as needed for severe pain.  . Multiple Vitamins-Minerals (CENTRUM SILVER 50+MEN) TABS Take by mouth daily.  . Nutritional Supplements (NUTRITIONAL SUPPLEMENT PO) Take 15 mLs by mouth in the morning and at bedtime. Fiberstat to help create formed stools  . QUEtiapine (SEROQUEL) 25 MG tablet Take 0.5 tablets (12.5 mg total) by mouth at bedtime.  . tamsulosin (FLOMAX) 0.4 MG CAPS Take 0.4 mg by mouth daily after breakfast.  . [DISCONTINUED] donepezil (ARICEPT) 10 MG tablet Take 10 mg by mouth daily.   No facility-administered encounter medications on file as of 06/08/2020.    Review of Systems  Constitutional: Positive for activity change and appetite change. Negative for chills, diaphoresis, fatigue, fever and unexpected weight change.  Respiratory: Negative for cough, shortness of breath, wheezing and stridor.   Cardiovascular: Negative for chest pain, palpitations and leg swelling.  Gastrointestinal: Negative for abdominal distention, abdominal pain, constipation and diarrhea.  Genitourinary: Negative for difficulty urinating and dysuria.  Musculoskeletal: Positive for gait problem. Negative for arthralgias, back pain, joint swelling and myalgias.  Neurological: Positive for speech difficulty and weakness. Negative for dizziness, seizures, syncope and headaches.  Hematological: Negative for adenopathy. Does not bruise/bleed easily.  Psychiatric/Behavioral: Positive for confusion.  Negative for agitation and behavioral problems.    Immunization History  Administered Date(s) Administered  . Influenza Inj Mdck Quad Pf 03/17/2016  . Influenza Whole 03/13/2013  . Influenza, High Dose Seasonal PF 03/14/2019  . Influenza,inj,Quad PF,6+ Mos 03/20/2018  . Influenza-Unspecified 03/04/2014, 03/12/2015, 03/23/2017, 03/19/2020  . Moderna Sars-Covid-2 Vaccination 06/04/2019, 07/02/2019  . PPD Test 07/05/2012  . Pneumococcal Conjugate-13 07/01/2016  . Pneumococcal Polysaccharide-23 07/16/1996  . Td 07/01/2016  . Zoster Recombinat (Shingrix) 06/30/2017   Pertinent  Health Maintenance Due  Topic Date Due  . OPHTHALMOLOGY EXAM  02/11/2020  . FOOT EXAM  08/11/2020  . HEMOGLOBIN A1C  10/18/2020  . INFLUENZA VACCINE  Completed  . PNA vac Low Risk Adult  Completed   Fall Risk  05/27/2020 04/12/2019 07/12/2018 04/28/2018 10/03/2017  Falls in the past year? 0 - 1 Exclusion - non ambulatory No  Number falls in past yr: 0 - 0 - -  Injury with Fall? 0 - 1 - -  Risk for fall due to : - History of fall(s);Impaired balance/gait;Impaired mobility;Mental status change;Medication  side effect History of fall(s);Impaired mobility;Impaired balance/gait;Mental status change Impaired mobility -  Follow up - Falls evaluation completed;Education provided;Falls prevention discussed Falls evaluation completed;Education provided;Falls prevention discussed Education provided -  Comment - at [place] involved bathing apparatus and staff were reeducated  - -   Functional Status Survey:    Vitals:   06/08/20 1656  BP: 110/79  Resp: 20  SpO2: 90%   There is no height or weight on file to calculate BMI. Physical Exam Vitals and nursing note reviewed.  Constitutional:      General: He is not in acute distress.    Appearance: He is not diaphoretic.  HENT:     Head: Normocephalic and atraumatic.  Eyes:     Conjunctiva/sclera: Conjunctivae normal.     Pupils: Pupils are equal, round, and  reactive to light.  Neck:     Thyroid: No thyromegaly.     Vascular: No JVD.     Trachea: No tracheal deviation.  Cardiovascular:     Rate and Rhythm: Normal rate and regular rhythm.     Heart sounds: No murmur heard.   Pulmonary:     Effort: Pulmonary effort is normal. No respiratory distress.     Breath sounds: Rales present. No wheezing.  Abdominal:     General: Bowel sounds are normal. There is no distension.     Palpations: Abdomen is soft.     Tenderness: There is no abdominal tenderness.  Musculoskeletal:     Right lower leg: No edema.     Left lower leg: No edema.  Lymphadenopathy:     Cervical: No cervical adenopathy.  Skin:    General: Skin is warm and dry.     Coloration: Skin is pale.  Neurological:     Mental Status: He is alert.     Comments: Alert and able to answer questions. Oriented to self and place but not time or situation. Chronic left sided weakness due to prior CVA  Psychiatric:        Mood and Affect: Mood and affect and mood normal.     Labs reviewed: Recent Labs    10/21/19 0000 10/21/19 0300  NA  --  144  K  --  4.1  CL  --  107  CO2  --  20  BUN  --  28*  CREATININE  --  1.5*  CALCIUM 10.4  --    Recent Labs    10/21/19 0000 10/21/19 0300  AST  --  22  ALT  --  14  ALBUMIN 4.0  --    Recent Labs    10/21/19 0000  WBC 10.5  HGB 12.0*  HCT 35*  PLT 238   Lab Results  Component Value Date   TSH 1.67 03/05/2018   Lab Results  Component Value Date   HGBA1C 6.0 04/20/2020   Lab Results  Component Value Date   CHOL 152 06/27/2013   HDL 55 06/27/2013   LDLCALC 78 06/27/2013   TRIG 83 06/27/2013    Significant Diagnostic Results in last 30 days:  No results found.  Assessment/Plan 1. Acute respiratory failure with hypoxia (HCC) Likely due to aspiration Goals of care are comfort based Continue ativan and roxanol as needed for pain or sob  2. Oropharyngeal dysphagia May have led to #1 Continue modified diet   1:1 supervision with meals Upright for meals   3. Type 2 diabetes mellitus with stage 3b chronic kidney disease, with long-term current use of insulin ([place])  Reduce levemir to 5 units bid and hold if intake <25%  4. Mixed Alzheimer's and vascular dementia ([place]) Discontinue Aricept due to lack of benefit    Family/ staff Communication: left message with his daughter Tana Felts   Labs/tests ordered:  NA

## 2020-06-09 ENCOUNTER — Encounter: Payer: Self-pay | Admitting: Internal Medicine

## 2020-06-09 ENCOUNTER — Non-Acute Institutional Stay (SKILLED_NURSING_FACILITY): Payer: Medicare PPO | Admitting: Internal Medicine

## 2020-06-09 DIAGNOSIS — R1312 Dysphagia, oropharyngeal phase: Secondary | ICD-10-CM | POA: Diagnosis not present

## 2020-06-09 DIAGNOSIS — F0150 Vascular dementia without behavioral disturbance: Secondary | ICD-10-CM

## 2020-06-09 DIAGNOSIS — F0280 Dementia in other diseases classified elsewhere without behavioral disturbance: Secondary | ICD-10-CM

## 2020-06-09 DIAGNOSIS — J9601 Acute respiratory failure with hypoxia: Secondary | ICD-10-CM | POA: Diagnosis not present

## 2020-06-09 DIAGNOSIS — G309 Alzheimer's disease, unspecified: Secondary | ICD-10-CM

## 2020-06-09 NOTE — Progress Notes (Signed)
Location:  [place] Room Number: 143 A Place of Service:  SNF (31) Provider:  Beatric Fulop L. Mariea Clonts, D.O., C.M.D.  Gayland Curry, DO  Patient Care Team: Gayland Curry, DO as PCP - General (Geriatric Medicine) Fay Records, Well [facility]  Extended Emergency Contact Information Primary Emergency Contact: [facility] Address: [address]          [place], [place] [address] [name] of [place] Phone: [phone] Mobile Phone: [phone] Relation: Daughter  Code Status:  DNR Goals of care: Advanced Directive information Advanced Directives 06/09/2020  Does Patient Have a Medical Advance Directive? Yes  Type of Advance Directive Out of facility DNR (pink MOST or yellow form);Living will  Does patient want to make changes to medical advance directive? No - Patient declined  Copy of [place] in Chart? -  Pre-existing out of facility DNR order (yellow form or pink MOST form) -   Chief Complaint  Patient presents with  . Acute Visit    Cyanotic episode 1/, on 02 and morphine, ativan; 1/9 had improvement with eating and no distress, 1/10 doing worse and his daughter.      HPI:  Pt is a 87 y.o. male seen today for an acute visit for cyanotic episode 1/8, on O2 and morphine, ativan; 1/9 had improvement with eating and no distress, 1/10 doing worse and his daughter was at bedside.  Today, I checked with his nurse and saw him and he was alert, sitting up in his wheelchair.  He said he was "ok" when I saw him.  Wearing oxygen but sat 95%.      Past Medical History:  Diagnosis Date  . Anemia, iron deficiency 12/30/2012  . Basal cell carcinoma   . Bipolar 1 disorder ([place])   . CKD (chronic kidney disease), stage II 07/09/2012   07/16/12:   stage II chronic kidney disease by GFR.  Follow lab at interval    . Dementia ([place])   . Diabetes ([place])   . GI bleed   . Hypertension   . Mixed Alzheimer's and vascular  dementia ([place]) 12/01/2012   06/07/12: STML re: CVA- contribute to debility/dependence. Pt. is aware of deficit. Will benefit from [place] environment for assistance, supervision, socialization    07/16/12:  Admission MDS reviewed: BIMS score 13/15,  functional status: Pt requires limited to extensive assist with all ADLs except eating (supervision)    . Pressure ulcer of sacrum 12/07/2012  . Squamous cell carcinoma of skin of scalp 01/24/14   anterior and posterior scalp   . Stroke [facility]) 08/2009   Left side with residual deficit   Past Surgical History:  Procedure Laterality Date  . HIP ARTHROPLASTY Left 12/01/2012   Procedure: ARTHROPLASTY BIPOLAR HIP;  Surgeon: Johnn Hai, MD;  Location: [facility] ORS;  Service: Orthopedics;  Laterality: Left;  . SKIN LESION EXCISION  01/24/14   Anterior and posterio scalp Squamous cell [name] PA    No Known Allergies  Outpatient Encounter Medications as of 06/09/2020  Medication Sig  . antiseptic oral rinse (BIOTENE) LIQD 15 mLs by Mouth Rinse route 4 (four) times daily.  Marland Kitchen aspirin 81 MG tablet Take 81 mg by mouth daily.  . carboxymethylcellulose (REFRESH PLUS) 0.5 % SOLN 2 drops 2 (two) times daily as needed.  . cholecalciferol (VITAMIN D) 1000 UNITS tablet Take 1,000 Units by mouth every morning.  . clomiPRAMINE (ANAFRANIL) 25 MG capsule Take 1 capsule (25 mg total) by mouth  at bedtime.  . insulin detemir (LEVEMIR) 100 UNIT/ML FlexPen Inject 5 Units into the skin 2 (two) times daily.  Marland Kitchen ketoconazole (NIZORAL) 2 % shampoo Apply 1 application topically 2 (two) times a week.  . latanoprost (XALATAN) 0.005 % ophthalmic solution 1 drop at bedtime.  Marland Kitchen LORazepam (ATIVAN) 0.5 MG tablet Take 1 tablet (0.5 mg total) by mouth every 4 (four) hours as needed for anxiety.  . metoprolol tartrate (LOPRESSOR) 25 MG tablet Take 75 mg by mouth 2 (two) times daily. Hold for heart rate less than 60  . morphine (ROXANOL) 20 MG/ML concentrated solution Take 0.25 mLs (5  mg total) by mouth every 2 (two) hours as needed for severe pain.  . Multiple Vitamins-Minerals (CENTRUM SILVER 50+MEN) TABS Take by mouth daily.  . Nutritional Supplements (NUTRITIONAL SUPPLEMENT PO) Take 15 mLs by mouth in the morning and at bedtime. Fiberstat to help create formed stools  . QUEtiapine (SEROQUEL) 25 MG tablet Take 0.5 tablets (12.5 mg total) by mouth at bedtime.  . tamsulosin (FLOMAX) 0.4 MG CAPS Take 0.4 mg by mouth daily after breakfast.   No facility-administered encounter medications on file as of 06/09/2020.    Review of Systems  Constitutional: Positive for malaise/fatigue. Negative for chills and fever.  HENT: Positive for hearing loss. Negative for congestion and sore throat.   Eyes: Negative for blurred vision.       Glasses  Respiratory: Negative for cough, sputum production and shortness of breath.   Cardiovascular: Positive for leg swelling. Negative for chest pain and palpitations.       Mild   Gastrointestinal: Negative for abdominal pain, blood in stool, constipation, diarrhea and melena.  Genitourinary: Negative for dysuria.  Musculoskeletal: Negative for falls and joint pain.  Neurological: Negative for dizziness and loss of consciousness.  Psychiatric/Behavioral: Positive for memory loss. Negative for depression. The patient is not nervous/anxious and does not have insomnia.     Immunization History  Administered Date(s) Administered  . Influenza Inj Mdck Quad Pf 03/17/2016  . Influenza Whole 03/13/2013  . Influenza, High Dose Seasonal PF 03/14/2019  . Influenza,inj,Quad PF,6+ Mos 03/20/2018  . Influenza-Unspecified 03/04/2014, 03/12/2015, 03/23/2017, 03/19/2020  . Moderna Sars-Covid-2 Vaccination 06/04/2019, 07/02/2019  . PPD Test 07/05/2012  . Pneumococcal Conjugate-13 07/01/2016  . Pneumococcal Polysaccharide-23 07/16/1996, 03/19/2008  . Td 07/01/2016  . Zoster Recombinat (Shingrix) 06/30/2017   Pertinent  Health Maintenance Due  Topic  Date Due  . OPHTHALMOLOGY EXAM  02/11/2020  . FOOT EXAM  08/11/2020  . HEMOGLOBIN A1C  10/18/2020  . INFLUENZA VACCINE  Completed  . PNA vac Low Risk Adult  Completed   Fall Risk  05/27/2020 04/12/2019 07/12/2018 04/28/2018 10/03/2017  Falls in the past year? 0 - 1 Exclusion - non ambulatory No  Number falls in past yr: 0 - 0 - -  Injury with Fall? 0 - 1 - -  Risk for fall due to : - History of fall(s);Impaired balance/gait;Impaired mobility;Mental status change;Medication side effect History of fall(s);Impaired mobility;Impaired balance/gait;Mental status change Impaired mobility -  Follow up - Falls evaluation completed;Education provided;Falls prevention discussed Falls evaluation completed;Education provided;Falls prevention discussed Education provided -  Comment - at [place] involved bathing apparatus and staff were reeducated  - -   Functional Status Survey:    Vitals:   06/09/20 1038  BP: 110/69  Pulse: 83  Resp: 20  Temp: (!) 97.3 F (36.3 C)  SpO2: 95%  Weight: 168 lb 6.4 oz (76.4 kg)  Height: 5\' 11"  (1.803  m)   Body mass index is 23.49 kg/m. Physical Exam Vitals reviewed.  Constitutional:      General: He is not in acute distress.    Appearance: Normal appearance. He is not toxic-appearing.  HENT:     Head: Normocephalic and atraumatic.  Eyes:     Conjunctiva/sclera: Conjunctivae normal.     Pupils: Pupils are equal, round, and reactive to light.  Cardiovascular:     Rate and Rhythm: Normal rate and regular rhythm.  Pulmonary:     Effort: Pulmonary effort is normal.     Breath sounds: Normal breath sounds. No wheezing, rhonchi or rales.  Abdominal:     General: Bowel sounds are normal.  Musculoskeletal:     Right lower leg: Edema present.     Comments: Chronic hemiparesis  Skin:    General: Skin is warm and dry.  Neurological:     Mental Status: He is alert. Mental status is at baseline.     Motor: Weakness present.     Gait: Gait abnormal.      Comments: hemiparesis  Psychiatric:        Mood and Affect: Mood normal.     Labs reviewed: Recent Labs    10/21/19 0000 10/21/19 0300  NA  --  144  K  --  4.1  CL  --  107  CO2  --  20  BUN  --  28*  CREATININE  --  1.5*  CALCIUM 10.4  --    Recent Labs    10/21/19 0000 10/21/19 0300  AST  --  22  ALT  --  14  ALBUMIN 4.0  --    Recent Labs    10/21/19 0000  WBC 10.5  HGB 12.0*  HCT 35*  PLT 238   Lab Results  Component Value Date   TSH 1.67 03/05/2018   Lab Results  Component Value Date   HGBA1C 6.0 04/20/2020   Lab Results  Component Value Date   CHOL 152 06/27/2013   HDL 55 06/27/2013   LDLCALC 78 06/27/2013   TRIG 83 06/27/2013    Significant Diagnostic Results in last 30 days:  No results found.  Assessment/Plan 1. Acute respiratory failure with hypoxia (HCC) -he is much more alert today, seems himself outside of wearing O2, respirations in normal range -monitor, has comfort meds if needed  2. Oropharyngeal dysphagia -likely cause of symptoms, cont aspiration precautions  3. Mixed Alzheimer's and vascular dementia ([place]) -progressing, continue supportive care with roxanol and ativan, aricept d/c'd due to lack of benefit at this stage his dementia  Family/ staff Communication: d/w snf staff today  Labs/tests ordered: none, goals are comfort-based  Rojelio Uhrich L. Koi Zangara, D.O. [facility] [address], [place] [address] Cell Phone (Mon-Fri 8am-5pm):  [phone] On Call:  [phone] & follow prompts after 5pm & weekends Office Phone:  [phone] Office Fax:  [phone]

## 2020-07-02 ENCOUNTER — Non-Acute Institutional Stay (SKILLED_NURSING_FACILITY): Payer: Medicare PPO | Admitting: Adult Health

## 2020-07-02 ENCOUNTER — Encounter: Payer: Self-pay | Admitting: Adult Health

## 2020-07-02 DIAGNOSIS — N1832 Chronic kidney disease, stage 3b: Secondary | ICD-10-CM | POA: Diagnosis not present

## 2020-07-02 DIAGNOSIS — R509 Fever, unspecified: Secondary | ICD-10-CM | POA: Diagnosis not present

## 2020-07-02 DIAGNOSIS — J9601 Acute respiratory failure with hypoxia: Secondary | ICD-10-CM

## 2020-07-02 DIAGNOSIS — E1122 Type 2 diabetes mellitus with diabetic chronic kidney disease: Secondary | ICD-10-CM | POA: Diagnosis not present

## 2020-07-02 DIAGNOSIS — Z794 Long term (current) use of insulin: Secondary | ICD-10-CM

## 2020-07-02 NOTE — Progress Notes (Unsigned)
Location:   [place] Room Number: 143-A Place of Service:  SNF [phone]) Provider:  Royal Hawthorn, NP    Patient Care Team: Gayland Curry, DO as PCP - General (Geriatric Medicine) Community, Well [name]  Extended Emergency Contact Information Primary Emergency Contact: [facility] Address: [address]          [place], [place] [address] [name] of [place] Phone: [phone] Mobile Phone: [phone] Relation: Daughter  Code Status:  DNR Goals of care: Advanced Directive information Advanced Directives 07/02/2020  Does Patient Have a Medical Advance Directive? Yes  Type of Paramedic of [place];Living will;Out of facility DNR (pink MOST or yellow form)  Does patient want to make changes to medical advance directive? No - Patient declined  Copy of [place] in Chart? Yes - validated most recent copy scanned in chart (See row information)  Pre-existing out of facility DNR order (yellow form or pink MOST form) -     Chief Complaint  Patient presents with  . Acute Visit    Fever    HPI:  Pt is a 87 y.o. male seen today for an acute visit for fever.  George Kemp presented on 2/2 with a low grade temp, rigors, shaking.  BP:119/74, pulse 139, temp 99.0 axillary, Sp02 96%. CBG:348. On call was notified, Windell Moulding, NP and ordered Tylenol 1000mg  Po one time, 2 units of Levemir one time, rapid COVID test, if available, COVID-PCR test, and isolation.   For my visit today he is resting comfortably on 2 liters of oxygen. Rapid covid was negative. PCR pending. No further fevers. No reports of pain or discomfort. Ativan given for resp distress on 2/2 which helped. His goals of care comfort based so no further work up or treatment is indicated.   He had an episode of low 02 sats on 06/08/20 and has been on 2 liters of oxygen since that time. The episode happened at a meal and there was  concern for aspiration. CXR was not obtained due to his goals of care.   CBGs have been running in the 300s. levemir held due to little or no intake per nurse.    Past Medical History:  Diagnosis Date  . Anemia, iron deficiency 12/30/2012  . Basal cell carcinoma   . Bipolar 1 disorder ([place])   . CKD (chronic kidney disease), stage II 07/09/2012   07/16/12:   stage II chronic kidney disease by GFR.  Follow lab at interval    . Dementia ([place])   . Diabetes ([place])   . GI bleed   . Hypertension   . Mixed Alzheimer's and vascular dementia ([place]) 12/01/2012   06/07/12: STML re: CVA- contribute to debility/dependence. Pt. is aware of deficit. Will benefit from [place] environment for assistance, supervision, socialization    07/16/12:  Admission MDS reviewed: BIMS score 13/15,  functional status: Pt requires limited to extensive assist with all ADLs except eating (supervision)    . Pressure ulcer of sacrum 12/07/2012  . Squamous cell carcinoma of skin of scalp 01/24/14   anterior and posterior scalp   . Stroke [facility]) 08/2009   Left side with residual deficit   Past Surgical History:  Procedure Laterality Date  . HIP ARTHROPLASTY Left 12/01/2012   Procedure: ARTHROPLASTY BIPOLAR HIP;  Surgeon: Johnn Hai, MD;  [facility]: [facility];  Service: Orthopedics;  Laterality: Left;  . SKIN LESION EXCISION  01/24/14   Anterior and posterio  scalp Squamous cell [name] PA    No Known Allergies  Allergies as of 07/02/2020   No Known Allergies     Medication List       Accurate as of July 02, 2020  3:27 PM. If you have any questions, ask your nurse or doctor.        STOP taking these medications   LORazepam 0.5 MG tablet Commonly known as: ATIVAN Stopped by: Royal Hawthorn, NP     TAKE these medications   antiseptic oral rinse Liqd 15 mLs by Mouth Rinse route 4 (four) times daily.   aspirin 81 MG tablet Take 81 mg by mouth daily.   carboxymethylcellulose 0.5 % Soln Commonly known as:  REFRESH PLUS 2 drops 2 (two) times daily as needed.   Centrum Silver 50+Men Tabs Take by mouth daily.   cholecalciferol 1000 units tablet Commonly known as: VITAMIN D Take 1,000 Units by mouth every morning.   clomiPRAMINE 25 MG capsule Commonly known as: Anafranil Take 1 capsule (25 mg total) by mouth at bedtime.   insulin detemir 100 UNIT/ML FlexPen Commonly known as: LEVEMIR Inject 5 Units into the skin 2 (two) times daily.   ketoconazole 2 % shampoo Commonly known as: NIZORAL Apply 1 application topically 2 (two) times a week.   latanoprost 0.005 % ophthalmic solution Commonly known as: XALATAN 1 drop at bedtime.   metoprolol tartrate 25 MG tablet Commonly known as: LOPRESSOR Take 75 mg by mouth 2 (two) times daily. Hold for heart rate less than 60   morphine 20 MG/ML concentrated solution Commonly known as: ROXANOL Take 0.25 mLs (5 mg total) by mouth every 2 (two) hours as needed for severe pain.   NUTRITIONAL SUPPLEMENT PO Take 15 mLs by mouth in the morning and at bedtime. Fiberstat to help create formed stools   QUEtiapine 25 MG tablet Commonly known as: SEROQUEL Take 0.5 tablets (12.5 mg total) by mouth at bedtime.   tamsulosin 0.4 MG Caps capsule Commonly known as: FLOMAX Take 0.4 mg by mouth daily after breakfast.       Review of Systems  Unable to perform ROS: Dementia    Immunization History  Administered Date(s) Administered  . Influenza Inj Mdck Quad Pf 03/17/2016  . Influenza Whole 03/13/2013  . Influenza, High Dose Seasonal PF 03/14/2019  . Influenza,inj,Quad PF,6+ Mos 03/20/2018  . Influenza-Unspecified 03/04/2014, 03/12/2015, 03/23/2017, 03/19/2020  . Moderna Sars-Covid-2 Vaccination 06/04/2019, 07/02/2019, 04/10/2020  . PPD Test 07/05/2012  . Pneumococcal Conjugate-13 07/01/2016  . Pneumococcal Polysaccharide-23 07/16/1996, 03/19/2008  . Td 07/01/2016  . Zoster Recombinat (Shingrix) 06/30/2017   Pertinent  Health Maintenance Due   Topic Date Due  . OPHTHALMOLOGY EXAM  02/11/2020  . FOOT EXAM  08/11/2020  . HEMOGLOBIN A1C  10/18/2020  . INFLUENZA VACCINE  Completed  . PNA vac Low Risk Adult  Completed   Fall Risk  05/27/2020 04/12/2019 07/12/2018 04/28/2018 10/03/2017  Falls in the past year? 0 - 1 Exclusion - non ambulatory No  Number falls in past yr: 0 - 0 - -  Injury with Fall? 0 - 1 - -  Risk for fall due to : - History of fall(s);Impaired balance/gait;Impaired mobility;Mental status change;Medication side effect History of fall(s);Impaired mobility;Impaired balance/gait;Mental status change Impaired mobility -  Follow up - Falls evaluation completed;Education provided;Falls prevention discussed Falls evaluation completed;Education provided;Falls prevention discussed Education provided -  Comment - at [place] involved bathing apparatus and staff were reeducated  - -   Functional Status Survey:  Vitals:   07/02/20 1522  BP: 112/74  Pulse: (!) 105  Resp: 18  Temp: (!) 97 F (36.1 C)  SpO2: 95%  Weight: 147 lb 3.2 oz (66.8 kg)  Height: 5\' 11"  (1.803 m)   Body mass index is 20.53 kg/m. Physical Exam Vitals and nursing note reviewed.  Constitutional:      General: He is not in acute distress.    Appearance: He is not diaphoretic.  HENT:     Head: Normocephalic and atraumatic.  Neck:     Thyroid: No thyromegaly.     Vascular: No JVD.     Trachea: No tracheal deviation.  Cardiovascular:     Rate and Rhythm: Regular rhythm. Tachycardia present.     Heart sounds: No murmur heard.   Pulmonary:     Effort: Pulmonary effort is normal. No respiratory distress.     Breath sounds: Rales present. No wheezing.  Abdominal:     General: Bowel sounds are normal. There is no distension.     Palpations: Abdomen is soft.     Tenderness: There is no abdominal tenderness.  Musculoskeletal:     Cervical back: Normal range of motion and neck supple.     Right lower leg: No edema.     Left lower leg: No  edema.  Lymphadenopathy:     Cervical: No cervical adenopathy.  Skin:    General: Skin is warm and dry.     Coloration: Skin is pale.  Neurological:     Mental Status: He is alert.     Cranial Nerves: No cranial nerve deficit.     Comments: Prior left sided weakness. Flaccid at this time. Not able to f/c.  Does not respond to verbal or physical stimulus  Psychiatric:        Mood and Affect: Mood and affect normal.     Labs reviewed: Recent Labs    10/21/19 0000 10/21/19 0300  NA  --  144  K  --  4.1  CL  --  107  CO2  --  20  BUN  --  28*  CREATININE  --  1.5*  CALCIUM 10.4  --    Recent Labs    10/21/19 0000 10/21/19 0300  AST  --  22  ALT  --  14  ALBUMIN 4.0  --    Recent Labs    10/21/19 0000  WBC 10.5  HGB 12.0*  HCT 35*  PLT 238   Lab Results  Component Value Date   TSH 1.67 03/05/2018   Lab Results  Component Value Date   HGBA1C 6.0 04/20/2020   Lab Results  Component Value Date   CHOL 152 06/27/2013   HDL 55 06/27/2013   LDLCALC 78 06/27/2013   TRIG 83 06/27/2013    Significant Diagnostic Results in last 30 days:  No results found.  Assessment/Plan  1. Acute respiratory failure with hypoxia (HCC) Possible aspiration pneumonia, appears to be nearing the end of life.  DNR in place  Goals of care are comfort based  Continue as needed roxanol and ativan.  Oxygen for comfort Discontinued non essential meds.   2. Fever, unspecified fever cause See #1 Final PCR still pending, will maintain isolation   3. Type 2 diabetes mellitus with stage 3b chronic kidney disease, with long-term current use of insulin (HCC) Hold insulin due to poor intake and goals of care.   Family/ staff Communication: discussed his care with the nurse, had left prior message with  his daughter to call me if she had questions.   Labs/tests ordered:  NA

## 2020-07-03 ENCOUNTER — Encounter: Payer: Self-pay | Admitting: Internal Medicine

## 2020-07-20 ENCOUNTER — Encounter: Payer: Self-pay | Admitting: Internal Medicine

## 2020-07-28 DEATH — deceased
